# Patient Record
Sex: Female | Born: 1945
Health system: Southern US, Community
[De-identification: ages and names within clinical notes are randomized; demographics above are authoritative.]

## PROBLEM LIST (undated history)

## (undated) DIAGNOSIS — I10 Essential (primary) hypertension: Secondary | ICD-10-CM

## (undated) DIAGNOSIS — S42309A Unspecified fracture of shaft of humerus, unspecified arm, initial encounter for closed fracture: Secondary | ICD-10-CM

## (undated) DIAGNOSIS — C9000 Multiple myeloma not having achieved remission: Principal | ICD-10-CM

## (undated) DIAGNOSIS — C801 Malignant (primary) neoplasm, unspecified: Secondary | ICD-10-CM

## (undated) DIAGNOSIS — R4020 Unspecified coma: Secondary | ICD-10-CM

## (undated) DIAGNOSIS — Z7189 Other specified counseling: Secondary | ICD-10-CM

## (undated) DIAGNOSIS — I519 Heart disease, unspecified: Secondary | ICD-10-CM

## (undated) HISTORY — DX: Unspecified coma: R40.20

## (undated) HISTORY — DX: Unspecified fracture of shaft of humerus, unspecified arm, initial encounter for closed fracture: S42.309A

## (undated) HISTORY — PX: OTHER SURGICAL HISTORY: SHX169

## (undated) HISTORY — DX: Other specified counseling: Z71.89

## (undated) HISTORY — DX: Hypercalcemia: E83.52

## (undated) HISTORY — DX: Multiple myeloma not having achieved remission: C90.00

---

## 2016-04-29 ENCOUNTER — Emergency Department (HOSPITAL_COMMUNITY)
Admission: EM | Admit: 2016-04-29 | Discharge: 2016-04-30 | Disposition: A | Discharge status: Home / Self Care | Payer: Medicare Other | Attending: Emergency Medicine | Admitting: Emergency Medicine

## 2016-04-29 ENCOUNTER — Emergency Department (HOSPITAL_COMMUNITY): Payer: Medicare Other

## 2016-04-29 DIAGNOSIS — W1839XA Other fall on same level, initial encounter: Secondary | ICD-10-CM | POA: Insufficient documentation

## 2016-04-29 DIAGNOSIS — Y92000 Kitchen of unspecified non-institutional (private) residence as  the place of occurrence of the external cause: Secondary | ICD-10-CM | POA: Insufficient documentation

## 2016-04-29 DIAGNOSIS — Y9389 Activity, other specified: Secondary | ICD-10-CM | POA: Diagnosis not present

## 2016-04-29 DIAGNOSIS — S42202A Unspecified fracture of upper end of left humerus, initial encounter for closed fracture: Secondary | ICD-10-CM | POA: Diagnosis not present

## 2016-04-29 DIAGNOSIS — M79602 Pain in left arm: Secondary | ICD-10-CM | POA: Diagnosis not present

## 2016-04-29 DIAGNOSIS — Y999 Unspecified external cause status: Secondary | ICD-10-CM | POA: Diagnosis not present

## 2016-04-29 DIAGNOSIS — R531 Weakness: Secondary | ICD-10-CM | POA: Diagnosis not present

## 2016-04-29 DIAGNOSIS — R404 Transient alteration of awareness: Secondary | ICD-10-CM | POA: Diagnosis not present

## 2016-04-29 DIAGNOSIS — S4992XA Unspecified injury of left shoulder and upper arm, initial encounter: Secondary | ICD-10-CM | POA: Diagnosis present

## 2016-04-29 NOTE — ED Triage Notes (Signed)
Fell from standing at home, landed on her left side.  Pt has deformity to left shoulder.

## 2016-04-29 NOTE — ED Provider Notes (Signed)
[facility] [place] DEPT Provider Note   CSN: [id number] Arrival date & time: 04/29/16  2215  By signing my name below, I, Gwenlyn Fudge, attest that this documentation has been prepared under the direction and in the presence of Orpah Greek, MD. Electronically Signed: Gwenlyn Fudge, ED Scribe. 04/29/16. 11:11 PM.   History   Chief Complaint No chief complaint on file.  The history is provided by the patient. No language interpreter was used.    HPI Comments: Deanna Holloway is a 71 y.o. female who presents to the Emergency Department complaining of constant, moderate, unchanged left shoulder pain s/p fall earlier tonight. She attempted to stand up on the chair in the kitchen and fell, landing on her left shoulder on hardwood flooring. She states pain radiates across left shoulder blade, but does not radiate up to her neck or down her left arm. Pt denies LOC, head injury, neck pain, left elbow pain.  No past medical history on file.  There are no active problems to display for this patient.   No past surgical history on file.  OB History    No data available       Home Medications    Prior to Admission medications   Medication Sig Start Date End Date Taking? Authorizing Provider  NON FORMULARY Take 1 scoop by mouth daily. YOUNGJEVITY:: [employer] contains a base of [employer] Derived Minerals blended with vitamins, amino acids, and other beneficial nutrients to make a balanced and complete daily supplement.  BENEFITS: Natural antioxidant Great tangy tangerine taste Supports overall health and a healthy immune, cardiovascular, and digestive system Gluten-free with no artificial sweeteners or preservatives Low glycemic index/glycemic friendly Promotes health blood sugar levels.   Yes Historical Provider, MD    Family History No family history on file.  Social History Social History  Substance Use Topics  . Smoking status: Not on file  . Smokeless  tobacco: Not on file  . Alcohol use Not on file     Allergies   Patient has no known allergies.   Review of Systems Review of Systems  Musculoskeletal: Positive for arthralgias. Negative for neck pain.  Neurological: Negative for syncope.  All other systems reviewed and are negative.  Physical Exam Updated Vital Signs BP 114/90   Pulse 76   Temp 98.2 F (36.8 C) (Oral)   Resp 17   SpO2 100%   Physical Exam  Constitutional: She is oriented to person, place, and time. She appears well-developed and well-nourished. No distress.  HENT:  Head: Normocephalic and atraumatic.  Right Ear: Hearing normal.  Left Ear: Hearing normal.  Nose: Nose normal.  Mouth/Throat: Oropharynx is clear and moist and mucous membranes are normal.  Eyes: Conjunctivae and EOM are normal. Pupils are equal, round, and reactive to light.  Neck: Normal range of motion. Neck supple.  Cardiovascular: Regular rhythm, S1 normal and S2 normal.  Exam reveals no gallop and no friction rub.   No murmur heard. Pulmonary/Chest: Effort normal and breath sounds normal. No respiratory distress. She exhibits no tenderness.  Abdominal: Soft. Normal appearance and bowel sounds are normal. There is no hepatosplenomegaly. There is no tenderness. There is no rebound, no guarding, no tenderness at McBurney's point and negative Murphy's sign. No hernia.  Musculoskeletal: Normal range of motion.  Tenderness and decreased ROM and no obvious deformity noted   Neurological: She is alert and oriented to person, place, and time. She has normal strength. No cranial nerve deficit or sensory deficit.  Coordination normal. GCS eye subscore is 4. GCS verbal subscore is 5. GCS motor subscore is 6.  Skin: Skin is warm, dry and intact. No rash noted. No cyanosis.  Psychiatric: She has a normal mood and affect. Her speech is normal and behavior is normal. Thought content normal.  Nursing note and vitals reviewed.  ED Treatments / Results    DIAGNOSTIC STUDIES: Oxygen Saturation is 99% on RA, normal by my interpretation.    COORDINATION OF CARE: 11:05 PM Discussed treatment plan with pt at bedside which includes DG Shoulder left and pt agreed to plan.  Labs (all labs ordered are listed, but only abnormal results are displayed) Labs Reviewed - No data to display  EKG  EKG Interpretation None       Radiology Dg Shoulder Left  Result Date: 04/30/2016 CLINICAL DATA:  Shoulder pain after falling backwards tonight onto a hardwood floor. EXAM: LEFT SHOULDER - 2+ VIEW COMPARISON:  None. FINDINGS: There is an acute proximal humeral fracture with good alignment and position. The fracture is obliquely oriented through the proximal diaphysis and surgical neck. No dislocation. No acute soft tissue abnormality. IMPRESSION: Proximal humeral fracture. Electronically Signed   By: Andreas Newport M.D.   On: 04/30/2016 00:06    Procedures Procedures (including critical care time)  Medications Ordered in ED Medications - No data to display   Initial Impression / Assessment and Plan / ED Course  I have reviewed the triage vital signs and the nursing notes.  Pertinent labs & imaging results that were available during my care of the patient were reviewed by me and considered in my medical decision making (see chart for details).    Patient presents for evaluation of left shoulder injury after a fall. Patient demented on her left shoulder area. There was no head injury or loss of consciousness. Denies neck and back pain. The only area of injury is shoulder. X-ray confirms nondisplaced proximal humerus fracture. Patient placed in sling, provided analgesia and will follow-up with orthopedics.  I personally performed the services described in this documentation, which was scribed in my presence. The recorded information has been reviewed and is accurate.   Final Clinical Impressions(s) / ED Diagnoses   Final diagnoses:  Closed  fracture of proximal end of left humerus, unspecified fracture morphology, initial encounter    New Prescriptions New Prescriptions   No medications on file     Orpah Greek, MD 04/30/16 0020

## 2016-04-30 DIAGNOSIS — S42202A Unspecified fracture of upper end of left humerus, initial encounter for closed fracture: Secondary | ICD-10-CM | POA: Diagnosis not present

## 2016-04-30 MED ORDER — HYDROCODONE-ACETAMINOPHEN 5-325 MG PO TABS: 0.5000 | ORAL_TABLET | ORAL | 0 refills | Status: DC | PRN

## 2016-04-30 NOTE — Discharge Instructions (Signed)
Schedule follow-up with one of the listed orthopedic surgeons next week to follow healing of your fracture.

## 2016-05-02 MED FILL — Hydrocodone-Acetaminophen Tab 5-325 MG: ORAL | Qty: 6 | Status: AC

## 2016-05-09 DIAGNOSIS — Z8673 Personal history of transient ischemic attack (TIA), and cerebral infarction without residual deficits: Secondary | ICD-10-CM | POA: Diagnosis not present

## 2016-05-09 DIAGNOSIS — Z5320 Procedure and treatment not carried out because of patient's decision for unspecified reasons: Secondary | ICD-10-CM | POA: Diagnosis not present

## 2016-05-09 DIAGNOSIS — D649 Anemia, unspecified: Secondary | ICD-10-CM | POA: Diagnosis not present

## 2016-05-12 ENCOUNTER — Emergency Department (HOSPITAL_COMMUNITY): Payer: Medicare Other

## 2016-05-12 ENCOUNTER — Encounter: Payer: Self-pay | Admitting: Emergency Medicine

## 2016-05-12 ENCOUNTER — Emergency Department (HOSPITAL_COMMUNITY)
Admission: EM | Admit: 2016-05-12 | Discharge: 2016-05-12 | Disposition: A | Discharge status: Home / Self Care | Payer: Medicare Other | Attending: Emergency Medicine | Admitting: Emergency Medicine

## 2016-05-12 DIAGNOSIS — E876 Hypokalemia: Secondary | ICD-10-CM

## 2016-05-12 DIAGNOSIS — R5383 Other fatigue: Secondary | ICD-10-CM

## 2016-05-12 DIAGNOSIS — Z79899 Other long term (current) drug therapy: Secondary | ICD-10-CM | POA: Insufficient documentation

## 2016-05-12 DIAGNOSIS — D649 Anemia, unspecified: Secondary | ICD-10-CM | POA: Insufficient documentation

## 2016-05-12 DIAGNOSIS — N83202 Unspecified ovarian cyst, left side: Secondary | ICD-10-CM | POA: Diagnosis not present

## 2016-05-12 DIAGNOSIS — K5900 Constipation, unspecified: Secondary | ICD-10-CM | POA: Diagnosis not present

## 2016-05-12 DIAGNOSIS — R930 Abnormal findings on diagnostic imaging of skull and head, not elsewhere classified: Secondary | ICD-10-CM | POA: Insufficient documentation

## 2016-05-12 DIAGNOSIS — R109 Unspecified abdominal pain: Secondary | ICD-10-CM

## 2016-05-12 DIAGNOSIS — N289 Disorder of kidney and ureter, unspecified: Secondary | ICD-10-CM | POA: Diagnosis not present

## 2016-05-12 DIAGNOSIS — R1084 Generalized abdominal pain: Secondary | ICD-10-CM | POA: Diagnosis not present

## 2016-05-12 DIAGNOSIS — M899 Disorder of bone, unspecified: Secondary | ICD-10-CM

## 2016-05-12 DIAGNOSIS — R05 Cough: Secondary | ICD-10-CM | POA: Diagnosis not present

## 2016-05-12 LAB — CBC WITH DIFFERENTIAL/PLATELET
BASOS ABS: 0 10*3/uL (ref 0.0–0.1)
Basophils Relative: 0 %
EOS PCT: 0 %
Eosinophils Absolute: 0 10*3/uL (ref 0.0–0.7)
HCT: 23.2 % — ABNORMAL LOW (ref 36.0–46.0)
HEMOGLOBIN: 7.8 g/dL — AB (ref 12.0–15.0)
LYMPHS ABS: 0.5 10*3/uL — AB (ref 0.7–4.0)
LYMPHS PCT: 20 %
MCH: 29.7 pg (ref 26.0–34.0)
MCHC: 33.6 g/dL (ref 30.0–36.0)
MCV: 88.2 fL (ref 78.0–100.0)
Monocytes Absolute: 0.2 10*3/uL (ref 0.1–1.0)
Monocytes Relative: 7 %
NEUTROS ABS: 1.9 10*3/uL (ref 1.7–7.7)
Neutrophils Relative %: 73 %
PLATELETS: 152 10*3/uL (ref 150–400)
RBC: 2.63 MIL/uL — AB (ref 3.87–5.11)
RDW: 17 % — ABNORMAL HIGH (ref 11.5–15.5)
WBC: 2.6 10*3/uL — AB (ref 4.0–10.5)

## 2016-05-12 LAB — TROPONIN I

## 2016-05-12 LAB — URINALYSIS, ROUTINE W REFLEX MICROSCOPIC
BILIRUBIN URINE: NEGATIVE
GLUCOSE, UA: NEGATIVE mg/dL
KETONES UR: NEGATIVE mg/dL
NITRITE: NEGATIVE
PH: 6 (ref 5.0–8.0)
Protein, ur: 30 mg/dL — AB
SPECIFIC GRAVITY, URINE: 1.012 (ref 1.005–1.030)

## 2016-05-12 LAB — BASIC METABOLIC PANEL
ANION GAP: 9 (ref 5–15)
BUN: 31 mg/dL — AB (ref 6–20)
CHLORIDE: 96 mmol/L — AB (ref 101–111)
CO2: 28 mmol/L (ref 22–32)
Calcium: 13.3 mg/dL (ref 8.9–10.3)
Creatinine, Ser: 1.78 mg/dL — ABNORMAL HIGH (ref 0.44–1.00)
GFR calc Af Amer: 32 mL/min — ABNORMAL LOW (ref >60)
GFR calc non Af Amer: 28 mL/min — ABNORMAL LOW (ref >60)
GLUCOSE: 104 mg/dL — AB (ref 65–99)
POTASSIUM: 3.3 mmol/L — AB (ref 3.5–5.1)
Sodium: 133 mmol/L — ABNORMAL LOW (ref 135–145)

## 2016-05-12 LAB — TYPE AND SCREEN
ABO/RH(D): A POS
ANTIBODY SCREEN: NEGATIVE

## 2016-05-12 LAB — SAMPLE TO BLOOD BANK

## 2016-05-12 LAB — LACTIC ACID, PLASMA: LACTIC ACID, VENOUS: 1 mmol/L (ref 0.5–1.9)

## 2016-05-12 MED ORDER — SODIUM CHLORIDE 0.9 % IV BOLUS (SEPSIS)
500.0000 mL | Freq: Once | INTRAVENOUS | Status: AC
  Administered 2016-05-12: 500 mL via INTRAVENOUS

## 2016-05-12 MED ORDER — SODIUM CHLORIDE 0.9 % IV SOLN
INTRAVENOUS | Status: DC
  Administered 2016-05-12: 100 mL/h via INTRAVENOUS

## 2016-05-12 MED ORDER — POTASSIUM CHLORIDE CRYS ER 20 MEQ PO TBCR
40.0000 meq | EXTENDED_RELEASE_TABLET | Freq: Once | ORAL | Status: AC
  Administered 2016-05-12: 40 meq via ORAL
  Filled 2016-05-12: qty 2

## 2016-05-12 MED ORDER — IOPAMIDOL (ISOVUE-300) INJECTION 61%
INTRAVENOUS | Status: AC
  Filled 2016-05-12: qty 30

## 2016-05-12 NOTE — Discharge Instructions (Signed)
Take your usual prescriptions as previously directed.  Call your regular medical doctor tomorrow to schedule a follow up appointment within the next 1 to 2 days. Call the GI doctor tomorrow to schedule a follow up appointment within the next 3 to 4 days. Call the Oncologist tomorrow to schedule a follow up appointment within the next week.  Return to the Emergency Department immediately sooner if worsening.

## 2016-05-12 NOTE — ED Provider Notes (Signed)
[facility] DEPT Provider Note   CSN: [id number] Arrival date & time: 05/12/16  1123     History   Chief Complaint Chief Complaint  Patient presents with  . Hemorrhoids  . Fatigue    HPI Deanna Holloway is a 71 y.o. female.  HPI Pt was seen at 1300. Per pt and her friend: c/o gradual onset and worsening of multiple symptoms for the past 12 days. Symptoms include: cough, generalized weakness/fatigue, nausea, poor PO intake, and rectal bleeding. Pt states she was evaluated in [facility] ED 3 days ago for her symptoms, received PRBC transfusion, then left AMA because she did not want to be transferred out to another hospital for admission. Pt's friend states pt is now "too weak to even get up and answer the phone." Pt states she continues to "see blood" on the toilet paper when she wipes, but has not had a BM in 4 days. States her last stools "were normal;" denies black or bloody stools. Denies CP/SOB, no abd pain, no vomiting/diarrhea, no fevers, no new falls, no syncope, no focal motor weakness.     No past medical history on file.  There are no active problems to display for this patient.   No past surgical history on file.  OB History    No data available       Home Medications    Prior to Admission medications   Medication Sig Start Date End Date Taking? Authorizing Provider  HYDROcodone-acetaminophen (NORCO/VICODIN) 5-325 MG tablet Take 0.5 tablets by mouth every 4 (four) hours as needed for moderate pain. 04/30/16   Orpah Greek, MD  HYDROcodone-acetaminophen (NORCO/VICODIN) 5-325 MG tablet Take 0.5 tablets by mouth every 4 (four) hours as needed. 04/30/16   Orpah Greek, MD  NON FORMULARY Take 1 scoop by mouth daily. YOUNGJEVITY:: [employer] contains a base of [employer] Derived Minerals blended with vitamins, amino acids, and other beneficial nutrients to make a balanced and complete daily supplement.  BENEFITS: Natural  antioxidant Great tangy tangerine taste Supports overall health and a healthy immune, cardiovascular, and digestive system Gluten-free with no artificial sweeteners or preservatives Low glycemic index/glycemic friendly Promotes health blood sugar levels.    Historical Provider, MD    Family History   Social History Social History  Substance Use Topics  . Smoking status: Not on file  . Smokeless tobacco: Not on file  . Alcohol use Not on file     Allergies   Patient has no known allergies.   Review of Systems Review of Systems ROS: Statement: All systems negative except as marked or noted in the HPI; Constitutional: Negative for fever and chills. +generalized weakness/fatigue.; ; Eyes: Negative for eye pain, redness and discharge. ; ; ENMT: Negative for ear pain, hoarseness, nasal congestion, sinus pressure and sore throat. ; ; Cardiovascular: Negative for chest pain, palpitations, diaphoresis, dyspnea and peripheral edema. ; ; Respiratory: +cough. Negative for wheezing and stridor. ; ; Gastrointestinal: Negative for vomiting, diarrhea, abdominal pain, hematemesis, jaundice and +nausea, +rectal bleeding. ; ; Genitourinary: Negative for dysuria, flank pain and hematuria. ; ; Musculoskeletal: Negative for back pain and neck pain. Negative for swelling and trauma.; ; Skin: Negative for pruritus, rash, abrasions, blisters, bruising and skin lesion.; ; Neuro: Negative for headache, lightheadedness and neck stiffness. Negative for altered level of consciousness, altered mental status, extremity weakness, paresthesias, involuntary movement, seizure and syncope.       Physical Exam Updated Vital Signs BP 128/60 (BP Location: Right Arm)  Pulse 87   Temp 99.2 F (37.3 C) (Oral)   Resp 25   Ht 5\' 3"  (1.6 m)   Wt 150 lb (68 kg)   SpO2 91%   BMI 26.57 kg/m     13:50 Orthostatic Vital Signs DF  Orthostatic Lying   BP- Lying: 128/60  Pulse- Lying: 88      Orthostatic Sitting  BP-  Sitting: 149/67  Pulse- Sitting: 89      Orthostatic Standing at 0 minutes  BP- Standing at 0 minutes: 112/58  Pulse- Standing at 0 minutes: 92    Physical Exam 1305: Physical examination:  Nursing notes reviewed; Vital signs and O2 SAT reviewed;  Constitutional: Well developed, Well nourished, Well hydrated, In no acute distress; Head:  Normocephalic, atraumatic; Eyes: EOMI, PERRL, No scleral icterus; ENMT: Mouth and pharynx normal, Mucous membranes moist; Neck: Supple, Full range of motion, No lymphadenopathy; Cardiovascular: Regular rate and rhythm, No gallop; Respiratory: Breath sounds clear & equal bilaterally, No wheezes.  Speaking full sentences with ease, Normal respiratory effort/excursion. +non-productive cough during exam.; Chest: Nontender, Movement normal; Abdomen: Soft, Nontender, Nondistended, Normal bowel sounds. Rectal exam performed w/permission of pt and ED RN chaperone present.  Anal tone normal.  Non-tender, soft black stool in rectal vault, heme neg.  No fissures, +external hemorrhoid with overlying scab, no active bleeding.;; Genitourinary: No CVA tenderness; Extremities: Pulses normal, +LUE in sling. No edema, No calf edema or asymmetry.; Neuro: AA&Ox3, rambling historian. Major CN grossly intact. No facial droop. Speech clear. +left arm in sling, otherwise no gross focal motor deficits in extremities.; Skin: Color normal, Warm, Dry.   ED Treatments / Results  Labs (all labs ordered are listed, but only abnormal results are displayed)   EKG  EKG Interpretation  Date/Time:  Thursday May 12 2016 13:47:04 EST Ventricular Rate:  87 PR Interval:    QRS Duration: 89 QT Interval:  322 QTC Calculation: 388 R Axis:     Text Interpretation:  Sinus rhythm Atrial premature complex Abnormal R-wave progression, early transition No old tracing to compare Confirmed by [facility]  MD, Nunzio Cory [phone]) on 05/12/2016 2:33:54 PM       Radiology   Procedures Procedures  (including critical care time)  Medications Ordered in ED Medications - No data to display   Initial Impression / Assessment and Plan / ED Course  I have reviewed the triage vital signs and the nursing notes.  Pertinent labs & imaging results that were available during my care of the patient were reviewed by me and considered in my medical decision making (see chart for details).  MDM Reviewed: previous chart, nursing note and vitals Interpretation: labs, ECG, x-ray and CT scan   Results for orders placed or performed during the hospital encounter of 05/12/16  CBC with Differential  Result Value Ref Range   WBC 2.6 (L) 4.0 - 10.5 K/uL   RBC 2.63 (L) 3.87 - 5.11 MIL/uL   Hemoglobin 7.8 (L) 12.0 - 15.0 g/dL   HCT 23.2 (L) 36.0 - 46.0 %   MCV 88.2 78.0 - 100.0 fL   MCH 29.7 26.0 - 34.0 pg   MCHC 33.6 30.0 - 36.0 g/dL   RDW 17.0 (H) 11.5 - 15.5 %   Platelets 152 150 - 400 K/uL   Neutrophils Relative % 73 %   Neutro Abs 1.9 1.7 - 7.7 K/uL   Lymphocytes Relative 20 %   Lymphs Abs 0.5 (L) 0.7 - 4.0 K/uL   Monocytes Relative 7 %  Monocytes Absolute 0.2 0.1 - 1.0 K/uL   Eosinophils Relative 0 %   Eosinophils Absolute 0.0 0.0 - 0.7 K/uL   Basophils Relative 0 %   Basophils Absolute 0.0 0.0 - 0.1 K/uL  Basic metabolic panel  Result Value Ref Range   Sodium 133 (L) 135 - 145 mmol/L   Potassium 3.3 (L) 3.5 - 5.1 mmol/L   Chloride 96 (L) 101 - 111 mmol/L   CO2 28 22 - 32 mmol/L   Glucose, Bld 104 (H) 65 - 99 mg/dL   BUN 31 (H) 6 - 20 mg/dL   Creatinine, Ser 1.78 (H) 0.44 - 1.00 mg/dL   Calcium 13.3 (HH) 8.9 - 10.3 mg/dL   GFR calc non Af Amer 28 (L) >60 mL/min   GFR calc Af Amer 32 (L) >60 mL/min   Anion gap 9 5 - 15  Troponin I  Result Value Ref Range   Troponin I <0.03 <0.03 ng/mL  Lactic acid, plasma  Result Value Ref Range   Lactic Acid, Venous 1.0 0.5 - 1.9 mmol/L  Urinalysis, Routine w reflex microscopic  Result Value Ref Range   Color, Urine YELLOW YELLOW    APPearance CLEAR CLEAR   Specific Gravity, Urine 1.012 1.005 - 1.030   pH 6.0 5.0 - 8.0   Glucose, UA NEGATIVE NEGATIVE mg/dL   Hgb urine dipstick SMALL (A) NEGATIVE   Bilirubin Urine NEGATIVE NEGATIVE   Ketones, ur NEGATIVE NEGATIVE mg/dL   Protein, ur 30 (A) NEGATIVE mg/dL   Nitrite NEGATIVE NEGATIVE   Leukocytes, UA TRACE (A) NEGATIVE   RBC / HPF 0-5 0 - 5 RBC/hpf   WBC, UA 0-5 0 - 5 WBC/hpf   Bacteria, UA RARE (A) NONE SEEN   Mucous PRESENT    Hyaline Casts, UA PRESENT   Sample to Blood Bank  Result Value Ref Range   Blood Bank Specimen SAMPLE AVAILABLE FOR TESTING    Sample Expiration 05/15/2016   Type and screen [facility]  Result Value Ref Range   ABO/RH(D) A POS    Antibody Screen NEG    Sample Expiration 05/15/2016    Dg Chest 2 View Result Date: 05/12/2016 CLINICAL DATA:  Cough and weakness. EXAM: CHEST  2 VIEW COMPARISON:  Plain films of the left shoulder 04/29/2016. FINDINGS: Lungs are clear. Heart size is upper normal. No pneumothorax or pleural effusion. Proximal left humerus fracture is identified as on the prior exam. IMPRESSION: No acute disease. Electronically Signed   By: Inge Rise M.D.   On: 05/12/2016 14:29   Ct Head Wo Contrast Result Date: 05/12/2016 CLINICAL DATA:  Continued weakness.  Fall 04/29/2016 EXAM: CT HEAD WITHOUT CONTRAST TECHNIQUE: Contiguous axial images were obtained from the base of the skull through the vertex without intravenous contrast. COMPARISON:  None. FINDINGS: Brain: No acute intracranial abnormality. Specifically, no hemorrhage, hydrocephalus, mass lesion, acute infarction, or significant intracranial injury. Vascular: No hyperdense vessel or unexpected calcification. Skull: Innumerable low-density lesions throughout the skull concerning for metastases or myeloma. Sinuses/Orbits: Visualized paranasal sinuses and mastoids clear. Orbital soft tissues unremarkable. Other: None IMPRESSION: Innumerable low-density lesions  throughout the skull concerning for diffuse metastasis or myeloma. No acute intracranial abnormality. Electronically Signed   By: Rolm Baptise M.D.   On: 05/12/2016 14:37     1415:  Pt states she left AMA from [place] 3 days ago and "someone from the hospital called and told me to go to a hospital immediately so you can pick up where [facility] left  off." ED RN investigated this: states pt called pt advocate at [facility] to complain about her visit and the advocate advised her to be seen again since she signed out AMA and refused admission/transfer. Workup ordered.   1600:  Pt orthostatic on VS and calcium elevated: judicious IVF given. Unknown baseline H/H; T&S ordered. No active rectal bleeding while in the ED. Initial attempt to get [facility] records without success. T/C to [facility], case discussed, including:  HPI, pertinent PM/SHx, VS/PE, dx testing, ED course and treatment:  Agreeable to come to ED for evaluation, requests to attempt again to obtain medical records from [facility] [place].   2035:  Pt has ambulated with steady gait; has tol PO well without N/V. No stooling or rectal bleeding while in the ED. T/C from [facility] Lor[facility]: states labs are improved from previous, there is no criteria to admit pt at this time, pt can be discharged home with outpatient f/u (see consult note). Strict return precautions given. Dx and testing d/w pt.  Questions answered.  Verb understanding, agreeable to d/c home with outpt f/u.     Final Clinical Impressions(s) / ED Diagnoses   Final diagnoses:  None    New Prescriptions New Prescriptions   No medications on file     Francine Graven, DO 05/15/16 2216

## 2016-05-12 NOTE — ED Notes (Signed)
Patient walked to restroom with standby assistance. Patient back in bed at this time.

## 2016-05-12 NOTE — ED Triage Notes (Signed)
Pt says she has hemorrhoids bright red blood when wiping but not blood in stool.

## 2016-05-12 NOTE — ED Notes (Signed)
Pt initially presented stating someone from [facility] called this morning and told her she needed to go straight to the closest ED.  Upon investigation of this, pt called the pt advocate to complain about her visit to [place] 3 days ago.  The advocate advised her to be seen again since she signed out AMA, refusing transfer.

## 2016-05-12 NOTE — Consult Note (Signed)
Medical Consultation   Deanna Holloway  [id number]  DOB: [birth year]  DOA: 05/12/2016  PCP: No PCP Per Patient - has not made an appointment, usually sees chiropractors Consultants:  None Patient coming from: home - lives with best friend; NOK: friend, [name], [phone]  Chief Complaint: abdominal "gurgling"  HPI: Deanna Holloway is a 71 y.o. female with no significant past medical history.  She reports that she fell back in October.  Things haven't been right since.  [place] again at the beginning of this month.  Broke her arm in that fall, placed in a left arm splint.    Her big complaint today is that her stomach and intestines are not working correctly.  They gurgle but she is unable to have a BM.  Last BM was 4 days ago.  "Eating accordingly because I ddin't want to overwhelm my intestines."  Took a few stool softeners 3 days ago.  Mild hemorrhoidal bleeding.  Wearing a mask to avoid exposure here, not because she has any symptoms.    She went to ER in [place] a couple of days ago for the same problem.  They gave her 2 units of blood and told her she needed to go to another hospital [facility]) to see GI.  She declined this and left AMA.  A little bit tired, but able to walk around as much as she can.  No SOB.  She thinks her Hgb was 5.8 at that visit.  Records from [place] requested.  She had NO guaiac performed at that visit.  Her Hgb was 5.8 and she was given 2 units PRBC.  The recommendation was for transfer for "suspected GI bleed."  The patient called the Patient Advocate today to complain about her treatment in [place], and they suggested she come to the ER immediately.  This is why she reported to our ER today.   ED Course: Ambulated with a steady gait, tolerated PO without N/V.   Ambulatory Status:  Walks with a cane  Review of Systems:  ROS As per HPI otherwise 10 point review of systems negative.    Past Medical History: History reviewed. No  pertinent past medical history.  Past Surgical History: History reviewed. No pertinent surgical history.   Allergies:  No Known Allergies   Social History:  reports that she has never smoked. She has never used smokeless tobacco. She reports that she does not drink alcohol or use drugs.   Family History: History reviewed. No pertinent family history.    Physical Exam: Vitals:   05/12/16 1600 05/12/16 1630 05/12/16 1700 05/12/16 1845  BP: 119/66 111/81 123/67 120/70  Pulse: 88   85  Resp: 18 24 24 22   Temp:      TempSrc:      SpO2: 91%   94%  Weight:      Height:        Constitutional: Alert and awake, oriented x3, not in any acute distress. Eyes: PERLA, EOMI, irises appear normal, anicteric sclera,  ENMT: external ears and nose appear normal,  Lips appears normal, oropharynx mucosa, tongue, posterior pharynx appear normal  Neck: neck appears normal, no masses, normal ROM, no thyromegaly, no JVD  CVS: S1-S2 clear, no murmur rubs or gallops, no LE edema, normal pedal pulses  Respiratory:  clear to auscultation bilaterally, no wheezing, rales or rhonchi. Respiratory effort normal. No accessory muscle use.  Abdomen: soft nontender, nondistended, normal bowel  sounds, no hepatosplenomegaly, no hernias  Musculoskeletal: : no cyanosis, clubbing or edema noted bilaterally Neuro: Cranial nerves II-XII intact, strength, sensation, reflexes Psych: judgement and insight appear normal, stable mood and affect, mental status Skin: no rashes or lesions or ulcers, no induration or nodules   Data reviewed:  I have personally reviewed following labs and imaging studies Labs:  CBC:  Recent Labs Lab 05/12/16 1251  WBC 2.6*  NEUTROABS 1.9  HGB 7.8*  HCT 23.2*  MCV 88.2  PLT 0000000    Basic Metabolic Panel:  Recent Labs Lab 05/12/16 1251  NA 133*  K 3.3*  CL 96*  CO2 28  GLUCOSE 104*  BUN 31*  CREATININE 1.78*  CALCIUM 13.3*   GFR Estimated Creatinine Clearance: 26.8  mL/min (by C-G formula based on SCr of 1.78 mg/dL (H)). Liver Function Tests: No results for input(s): AST, ALT, ALKPHOS, BILITOT, PROT, ALBUMIN in the last 168 hours. No results for input(s): LIPASE, AMYLASE in the last 168 hours. No results for input(s): AMMONIA in the last 168 hours. Coagulation profile No results for input(s): INR, PROTIME in the last 168 hours.  Cardiac Enzymes:  Recent Labs Lab 05/12/16 1358  TROPONINI <0.03   BNP: Invalid input(s): POCBNP CBG: No results for input(s): GLUCAP in the last 168 hours. D-Dimer No results for input(s): DDIMER in the last 72 hours. Hgb A1c No results for input(s): HGBA1C in the last 72 hours. Lipid Profile No results for input(s): CHOL, HDL, LDLCALC, TRIG, CHOLHDL, LDLDIRECT in the last 72 hours. Thyroid function studies No results for input(s): TSH, T4TOTAL, T3FREE, THYROIDAB in the last 72 hours.  Invalid input(s): FREET3 Anemia work up No results for input(s): VITAMINB12, FOLATE, FERRITIN, TIBC, IRON, RETICCTPCT in the last 72 hours. Urinalysis    Component Value Date/Time   COLORURINE YELLOW 05/12/2016 1311   APPEARANCEUR CLEAR 05/12/2016 1311   LABSPEC 1.012 05/12/2016 1311   PHURINE 6.0 05/12/2016 1311   GLUCOSEU NEGATIVE 05/12/2016 1311   HGBUR SMALL (A) 05/12/2016 1311   BILIRUBINUR NEGATIVE 05/12/2016 1311   KETONESUR NEGATIVE 05/12/2016 1311   PROTEINUR 30 (A) 05/12/2016 1311   NITRITE NEGATIVE 05/12/2016 1311   LEUKOCYTESUR TRACE (A) 05/12/2016 1311     Microbiology No results found for this or any previous visit (from the past 240 hour(s)).   EKG: NSR, rate 87, nonspecific ST changes without evidence of acute ischemia  Inpatient Medications:   Scheduled Meds: . iopamidol       Continuous Infusions: . sodium chloride Stopped (05/12/16 2040)     Radiological Exams on Admission: Ct Abdomen Pelvis Wo Contrast  Result Date: 05/12/2016 CLINICAL DATA:  Hemorrhoids with bright red blood upon  wiping but no blood in stool. EXAM: CT ABDOMEN AND PELVIS WITHOUT CONTRAST TECHNIQUE: Multidetector CT imaging of the abdomen and pelvis was performed following the standard protocol without IV contrast. COMPARISON:  None. FINDINGS: Lower chest: Top normal size cardiac chambers. No pericardial effusion. Small right pleural effusion with adjacent atelectasis and/or scarring. Hepatobiliary: Hypodensities in the left hepatic lobe consistent with hepatic cysts measuring 1.6 and 2.3 cm. A 1.2 cm right hepatic lobe cyst is also noted. Gallbladder is nondistended. There is no calculi. No biliary dilatation. Pancreas: Pancreas demonstrates no ductal dilatation, peripancreatic inflammation or mass. Spleen: Normal Adrenals/Urinary Tract: No adrenal mass. No nephrolithiasis. 2.3 mm bladder calculus noted on the left. Stomach/Bowel: Contracted stomach. Normal small bowel rotation without small bowel dilatation or inflammation. Contrast reaches large intestine. No annular constricting lesions are noted.  There is no large bowel obstruction or acute inflammation. There is scattered colonic diverticulosis the sigmoid. No significant perirectal nor perianal thickening. No inflammation. Vascular/Lymphatic: Mild aortic atherosclerosis without aneurysm. No lymphadenopathy. Reproductive: Uterus and ovaries are visualized. A few coarse calcifications of doubtful clinical significance noted of the right ovary. There is a simple appearing cyst of the left ovary measuring approximately 2 cm. No worrisome features. Other:  Tiny fat containing umbilical hernia. Musculoskeletal: The bones are osteopenic in appearance there is mild dextroconvex curvature of the lumbar spine. Disc space narrowing L3-4 and L4-5 with associated facet arthropathy. Slight coarsening and thickening of the bony pelvis and hips may reflect changes of Paget's disease. IMPRESSION: No acute bowel inflammation or obstruction. No focal lesions are identified. Simple  appearing hepatic and left ovarian cysts. Small right pleural effusion with atelectasis and/or scarring. Coarsened and thickened appearance of the bony pelvis which may reflect Paget's disease. Degenerative changes of the lower lumbar spine. Electronically Signed   By: Ashley Royalty M.D.   On: 05/12/2016 20:21   Dg Chest 2 View  Result Date: 05/12/2016 CLINICAL DATA:  Cough and weakness. EXAM: CHEST  2 VIEW COMPARISON:  Plain films of the left shoulder 04/29/2016. FINDINGS: Lungs are clear. Heart size is upper normal. No pneumothorax or pleural effusion. Proximal left humerus fracture is identified as on the prior exam. IMPRESSION: No acute disease. Electronically Signed   By: Inge Rise M.D.   On: 05/12/2016 14:29   Ct Head Wo Contrast  Result Date: 05/12/2016 CLINICAL DATA:  Continued weakness.  Fall 04/29/2016 EXAM: CT HEAD WITHOUT CONTRAST TECHNIQUE: Contiguous axial images were obtained from the base of the skull through the vertex without intravenous contrast. COMPARISON:  None. FINDINGS: Brain: No acute intracranial abnormality. Specifically, no hemorrhage, hydrocephalus, mass lesion, acute infarction, or significant intracranial injury. Vascular: No hyperdense vessel or unexpected calcification. Skull: Innumerable low-density lesions throughout the skull concerning for metastases or myeloma. Sinuses/Orbits: Visualized paranasal sinuses and mastoids clear. Orbital soft tissues unremarkable. Other: None IMPRESSION: Innumerable low-density lesions throughout the skull concerning for diffuse metastasis or myeloma. No acute intracranial abnormality. Electronically Signed   By: Rolm Baptise M.D.   On: 05/12/2016 14:37    Impression/Recommendations Principal Problem:   Abdominal pain  Pertinent labs: Troponin negative Lactate normal Creatinine 1.78 (was 2.01 in [place]) Calcium 13.3 WBC 2.6 Hgb 7.8 (was 5.8 in [place] and she was transfused 2 units PRBC) UA unremarkable  -Patient  presenting to the ER because she was told to come when she called the patient advocate in [place] to complain about her treatment in the ER 3 days prior. -At that time, she was found to be anemic and was thought to have symptomatic anemia.  She was transfused 2 units PRBC and her hemoglobin today is commensurate with this and appropriate. -She denies GI bleeding. -She is reportedly heme negative per Dr. Thurnell Garbe (I do not see this lab result at this time). -She is really without significant complaint and appears to have chronic problems (see below) that need to be addressed ASAP. -She may have some constipation. 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 -While she does appear to have hypercalcemia of malignancy, she is not complaining of symptoms accordingly (moans, groans, and psychotic overtones). -She does not appear to have acute issues requiring hospitalization. -Would definitely suggest short-term outpatient  PCP follow-up as well as referral to oncology for outpatient evaluation.  Thank you for this consultation.    Time Spent: 75 minutes  Karmen Bongo M.D. [facility] 05/12/2016, 8:59 PM

## 2016-05-12 NOTE — ED Triage Notes (Signed)
Pt was seen here at [facility] on 04/29/16 for fall.  Pt given instructions and medications but has not followed up with recommendations or filled prescriptions.  Pt went to [facility] on Feb 13th, c/o blood in stool, weakness.  Pt was given 2 units of blood in ED and [place] wanted to transfer to [facility] (GI) but friend Saverio Danker [phone]) and pt did not want to travel that far for treatment.  Mr. Georgeanna Lea says Barnabas Harries called pt is am and told pt to go to ED for treatment.

## 2016-05-13 ENCOUNTER — Telehealth (HOSPITAL_COMMUNITY): Payer: Self-pay | Admitting: Oncology

## 2016-05-13 ENCOUNTER — Other Ambulatory Visit (HOSPITAL_COMMUNITY): Payer: Self-pay | Admitting: Oncology

## 2016-05-13 DIAGNOSIS — D649 Anemia, unspecified: Secondary | ICD-10-CM

## 2016-05-13 DIAGNOSIS — R930 Abnormal findings on diagnostic imaging of skull and head, not elsewhere classified: Secondary | ICD-10-CM

## 2016-05-13 NOTE — Telephone Encounter (Signed)
I personally reviewed the patient's chart.  I note that she is significantly hypercalcemic (no albumin available to correct if patient is hypoalbuminemic).  She also has moderate anemia.  Her CT head is concerning for multiple myeloma/plasmacytoma.  I am surprised that Dr. Lorin Mercy did not feel she was a candidate for hospitalization.  Given the severity of her labs and concern for a corrected calcium even higher than reported in the ED, I personally called her after hours to work her into the clinic on Monday.  I spoke with the patient.  She reports that she feels fine.  Her words are slow and slurred.  I asked her to come to the clinic tomorrow for her anemia and high calcium and she refused.  With her refusal, I will ask scheduler to call her again Monday AM to see if we can convince her of the importance of seeing [place].  If she does come, she will need STAT labs, IV zometa for hypercalcemia, and we will see her in consultation with plans for PET imaging and bone marrow aspiration and biopsy.  Robynn Pane, PA-C 05/13/2016 5:16 PM

## 2016-05-14 LAB — URINE CULTURE: CULTURE: NO GROWTH

## 2016-05-16 NOTE — Telephone Encounter (Signed)
Called patient this am and tried to get her to come in for repeat labs and to see provider. I explained her calcium was extremely high and she was anemic also. She stated " I'm not coming in but thanks for calling." Instructed patient to call cancer center if she changed her mind. She verbalized understanding.

## 2016-05-27 ENCOUNTER — Encounter (HOSPITAL_COMMUNITY): Payer: Self-pay | Admitting: Lab

## 2016-05-27 ENCOUNTER — Other Ambulatory Visit (HOSPITAL_COMMUNITY): Payer: Self-pay | Admitting: Oncology

## 2016-05-27 DIAGNOSIS — D649 Anemia, unspecified: Secondary | ICD-10-CM

## 2016-05-27 DIAGNOSIS — E43 Unspecified severe protein-calorie malnutrition: Secondary | ICD-10-CM | POA: Diagnosis not present

## 2016-05-27 DIAGNOSIS — M8588 Other specified disorders of bone density and structure, other site: Secondary | ICD-10-CM | POA: Diagnosis present

## 2016-05-27 DIAGNOSIS — Z8249 Family history of ischemic heart disease and other diseases of the circulatory system: Secondary | ICD-10-CM | POA: Diagnosis not present

## 2016-05-27 DIAGNOSIS — E872 Acidosis: Secondary | ICD-10-CM | POA: Diagnosis not present

## 2016-05-27 DIAGNOSIS — Z818 Family history of other mental and behavioral disorders: Secondary | ICD-10-CM | POA: Diagnosis not present

## 2016-05-27 DIAGNOSIS — R918 Other nonspecific abnormal finding of lung field: Secondary | ICD-10-CM | POA: Diagnosis not present

## 2016-05-27 DIAGNOSIS — R937 Abnormal findings on diagnostic imaging of other parts of musculoskeletal system: Secondary | ICD-10-CM | POA: Diagnosis not present

## 2016-05-27 DIAGNOSIS — Z91048 Other nonmedicinal substance allergy status: Secondary | ICD-10-CM | POA: Diagnosis not present

## 2016-05-27 DIAGNOSIS — K5900 Constipation, unspecified: Secondary | ICD-10-CM | POA: Diagnosis present

## 2016-05-27 DIAGNOSIS — R7989 Other specified abnormal findings of blood chemistry: Secondary | ICD-10-CM | POA: Diagnosis not present

## 2016-05-27 DIAGNOSIS — D472 Monoclonal gammopathy: Secondary | ICD-10-CM | POA: Diagnosis present

## 2016-05-27 DIAGNOSIS — Z6826 Body mass index (BMI) 26.0-26.9, adult: Secondary | ICD-10-CM | POA: Diagnosis not present

## 2016-05-27 DIAGNOSIS — D892 Hypergammaglobulinemia, unspecified: Secondary | ICD-10-CM | POA: Diagnosis not present

## 2016-05-27 DIAGNOSIS — Z66 Do not resuscitate: Secondary | ICD-10-CM | POA: Diagnosis not present

## 2016-05-27 DIAGNOSIS — C9000 Multiple myeloma not having achieved remission: Secondary | ICD-10-CM | POA: Diagnosis not present

## 2016-05-27 DIAGNOSIS — N12 Tubulo-interstitial nephritis, not specified as acute or chronic: Secondary | ICD-10-CM | POA: Diagnosis not present

## 2016-05-27 DIAGNOSIS — D61818 Other pancytopenia: Secondary | ICD-10-CM | POA: Diagnosis not present

## 2016-05-27 DIAGNOSIS — D72819 Decreased white blood cell count, unspecified: Secondary | ICD-10-CM | POA: Diagnosis not present

## 2016-05-27 DIAGNOSIS — M899 Disorder of bone, unspecified: Secondary | ICD-10-CM | POA: Diagnosis not present

## 2016-05-27 DIAGNOSIS — N19 Unspecified kidney failure: Secondary | ICD-10-CM | POA: Diagnosis not present

## 2016-05-27 DIAGNOSIS — N179 Acute kidney failure, unspecified: Secondary | ICD-10-CM | POA: Diagnosis not present

## 2016-05-27 DIAGNOSIS — R5081 Fever presenting with conditions classified elsewhere: Secondary | ICD-10-CM | POA: Diagnosis not present

## 2016-05-27 DIAGNOSIS — S42202A Unspecified fracture of upper end of left humerus, initial encounter for closed fracture: Secondary | ICD-10-CM | POA: Diagnosis not present

## 2016-05-27 DIAGNOSIS — I471 Supraventricular tachycardia: Secondary | ICD-10-CM | POA: Diagnosis not present

## 2016-05-27 DIAGNOSIS — D696 Thrombocytopenia, unspecified: Secondary | ICD-10-CM | POA: Diagnosis not present

## 2016-05-27 NOTE — Progress Notes (Unsigned)
Patient called and I gave her new appt date for 3/7.  Patient said that is not soon enough for apptand to talk to Dr appt that. I talked to [facility] and he stated that it was the 1st appt.  Patient was rude and angery about appt date and would not let me tell her appt time.  I told Gershon Mussel and he is aware of the issue.

## 2016-05-28 DIAGNOSIS — R937 Abnormal findings on diagnostic imaging of other parts of musculoskeletal system: Secondary | ICD-10-CM | POA: Diagnosis not present

## 2016-05-28 DIAGNOSIS — D649 Anemia, unspecified: Secondary | ICD-10-CM | POA: Diagnosis not present

## 2016-05-28 DIAGNOSIS — R7989 Other specified abnormal findings of blood chemistry: Secondary | ICD-10-CM | POA: Diagnosis not present

## 2016-06-01 ENCOUNTER — Ambulatory Visit (HOSPITAL_COMMUNITY): Payer: Medicare Other

## 2016-06-01 ENCOUNTER — Ambulatory Visit (HOSPITAL_COMMUNITY): Payer: Medicare Other | Admitting: Oncology

## 2016-06-01 ENCOUNTER — Other Ambulatory Visit (HOSPITAL_COMMUNITY): Payer: Medicare Other

## 2016-06-06 ENCOUNTER — Telehealth (HOSPITAL_COMMUNITY): Payer: Self-pay | Admitting: Oncology

## 2016-06-06 NOTE — Telephone Encounter (Signed)
Spoke with Dr. Corinne Ports this AM about patient.  He is the fellow who has been involved with the patient's care at [facility].  He will fax [place] records about the patient.  Patient is being discharged today. She has refused BMBX.  She did get Zometa.  Unfortunately, her creatinine is trending upwards, likely from her myeloma.  She has been resistant to treatment.  Hopefully we can start her on Vd and if renal function improves consider RVd.  Patient is resistant to IV chemotherapy, according to [facility] and therefore, Velcade SQ may be our only treatment option at this time.  She has also needed PRBC transfusion, last one on 06/04/2016, at [facility].  She was given 20 mg of Dexamethasone daily x 4 days for MM treatment, but developed SVT, likely secondary to Dex.  This has been medically managed and corrected.  Dr. Corinne Ports reports that the patient is a little more open to discussion regarding her MM than when she was first admitted at [facility].  Robynn Pane, PA-C 06/06/2016 3:44 PM

## 2016-06-07 ENCOUNTER — Encounter (HOSPITAL_COMMUNITY): Payer: Self-pay

## 2016-06-07 ENCOUNTER — Encounter (HOSPITAL_COMMUNITY): Payer: Medicare Other

## 2016-06-07 ENCOUNTER — Encounter (HOSPITAL_COMMUNITY): Payer: Medicare Other | Attending: Oncology | Admitting: Oncology

## 2016-06-07 DIAGNOSIS — D649 Anemia, unspecified: Secondary | ICD-10-CM

## 2016-06-07 DIAGNOSIS — R609 Edema, unspecified: Secondary | ICD-10-CM

## 2016-06-07 DIAGNOSIS — C9000 Multiple myeloma not having achieved remission: Secondary | ICD-10-CM

## 2016-06-07 NOTE — Patient Instructions (Signed)
[facility] Discharge Instructions  RECOMMENDATIONS MADE BY THE CONSULTANT AND ANY TEST RESULTS WILL BE SENT TO YOUR REFERRING PHYSICIAN.  You were seen today by Dr. Twana First STAT bone marrow biopsy will be scheduled Follow up in 2 weeks See Amy up front for appointments   Thank you for choosing [place] [facility] to provide your oncology and hematology care.  To afford each patient quality time with our provider, please arrive at least 15 minutes before your scheduled appointment time.    If you have a lab appointment with the [facility] please come in thru the  Main Entrance and check in at the main information desk  You need to re-schedule your appointment should you arrive 10 or more minutes late.  We strive to give you quality time with our providers, and arriving late affects you and other patients whose appointments are after yours.  Also, if you no show three or more times for appointments you may be dismissed from the clinic at the providers discretion.     Again, thank you for choosing [facility].  Our hope is that these requests will decrease the amount of time that you wait before being seen by our physicians.       _____________________________________________________________  Should you have questions after your visit to [facility], please contact our office at [phone] between the hours of 8:30 a.m. and 4:30 p.m.  Voicemails left after 4:30 p.m. will not be returned until the following business day.  For prescription refill requests, have your pharmacy contact our office.       Resources For Cancer Patients and their Caregivers ? American Cancer Society: Can assist with transportation, wigs, general needs, runs Look Good Feel Better.        787-809-7477 ? Cancer Care: Provides financial assistance, online support groups, medication/co-pay assistance.   1-800-813-HOPE 801-541-5276) ? [place] Assists [place] cancer patients and their families through emotional , educational and financial support.  850-840-0286 ? [place] DSS Where to apply for food stamps, Medicaid and utility assistance. 803 757 2749 ? [facility]: Transportation to medical appointments. 331 172 5173 ? Social Security Administration: May apply for disability if have a Stage IV cancer. (718) 656-8256 409-050-6554 ? [employer], Disability and Transit Services: Assists with nutrition, care and transit needs. [place] Support Programs: '@10RELATIVEDAYS' @ > Cancer Support Group  2nd Tuesday of the month 1pm-2pm, Journey Room  > Creative Journey  3rd Tuesday of the month 1130am-1pm, Journey Room  > Look Good Feel Better  1st Wednesday of the month 10am-12 noon, Journey Room (Call [facility] to register [phone])
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
## 2016-06-07 NOTE — Progress Notes (Signed)
No Zometa given today per Dr. Talbert Cage

## 2016-06-12 ENCOUNTER — Other Ambulatory Visit: Payer: Self-pay

## 2016-06-12 ENCOUNTER — Inpatient Hospital Stay (HOSPITAL_COMMUNITY)
Admission: EM | Admit: 2016-06-12 | Discharge: 2016-06-13 | DRG: 682 | Disposition: A | Discharge status: Home / Self Care | Payer: Medicare Other | Attending: Family Medicine | Admitting: Family Medicine

## 2016-06-12 ENCOUNTER — Encounter (HOSPITAL_COMMUNITY): Payer: Self-pay | Admitting: Emergency Medicine

## 2016-06-12 DIAGNOSIS — Z66 Do not resuscitate: Secondary | ICD-10-CM | POA: Diagnosis not present

## 2016-06-12 DIAGNOSIS — Z8781 Personal history of (healed) traumatic fracture: Secondary | ICD-10-CM | POA: Diagnosis not present

## 2016-06-12 DIAGNOSIS — Z91048 Other nonmedicinal substance allergy status: Secondary | ICD-10-CM

## 2016-06-12 DIAGNOSIS — N189 Chronic kidney disease, unspecified: Secondary | ICD-10-CM | POA: Diagnosis present

## 2016-06-12 DIAGNOSIS — Z79899 Other long term (current) drug therapy: Secondary | ICD-10-CM

## 2016-06-12 DIAGNOSIS — E860 Dehydration: Secondary | ICD-10-CM | POA: Diagnosis present

## 2016-06-12 DIAGNOSIS — D899 Disorder involving the immune mechanism, unspecified: Secondary | ICD-10-CM | POA: Diagnosis present

## 2016-06-12 DIAGNOSIS — Z6823 Body mass index (BMI) 23.0-23.9, adult: Secondary | ICD-10-CM

## 2016-06-12 DIAGNOSIS — D649 Anemia, unspecified: Secondary | ICD-10-CM | POA: Diagnosis present

## 2016-06-12 DIAGNOSIS — Z87820 Personal history of traumatic brain injury: Secondary | ICD-10-CM | POA: Diagnosis not present

## 2016-06-12 DIAGNOSIS — Z888 Allergy status to other drugs, medicaments and biological substances status: Secondary | ICD-10-CM

## 2016-06-12 DIAGNOSIS — R197 Diarrhea, unspecified: Secondary | ICD-10-CM | POA: Diagnosis present

## 2016-06-12 DIAGNOSIS — C9000 Multiple myeloma not having achieved remission: Secondary | ICD-10-CM | POA: Diagnosis not present

## 2016-06-12 DIAGNOSIS — N179 Acute kidney failure, unspecified: Secondary | ICD-10-CM | POA: Diagnosis not present

## 2016-06-12 DIAGNOSIS — E43 Unspecified severe protein-calorie malnutrition: Secondary | ICD-10-CM | POA: Diagnosis not present

## 2016-06-12 DIAGNOSIS — Z9181 History of falling: Secondary | ICD-10-CM | POA: Diagnosis not present

## 2016-06-12 DIAGNOSIS — E872 Acidosis: Secondary | ICD-10-CM | POA: Diagnosis present

## 2016-06-12 DIAGNOSIS — I959 Hypotension, unspecified: Secondary | ICD-10-CM | POA: Diagnosis present

## 2016-06-12 DIAGNOSIS — Z8249 Family history of ischemic heart disease and other diseases of the circulatory system: Secondary | ICD-10-CM

## 2016-06-12 HISTORY — DX: Malignant (primary) neoplasm, unspecified: C80.1

## 2016-06-12 LAB — BASIC METABOLIC PANEL
Anion gap: 10 (ref 5–15)
BUN: 42 mg/dL — ABNORMAL HIGH (ref 6–20)
CHLORIDE: 104 mmol/L (ref 101–111)
CO2: 20 mmol/L — ABNORMAL LOW (ref 22–32)
Calcium: 5.9 mg/dL — CL (ref 8.9–10.3)
Creatinine, Ser: 2.79 mg/dL — ABNORMAL HIGH (ref 0.44–1.00)
GFR calc non Af Amer: 16 mL/min — ABNORMAL LOW (ref >60)
GFR, EST AFRICAN AMERICAN: 19 mL/min — AB (ref >60)
Glucose, Bld: 128 mg/dL — ABNORMAL HIGH (ref 65–99)
POTASSIUM: 3.5 mmol/L (ref 3.5–5.1)
Sodium: 134 mmol/L — ABNORMAL LOW (ref 135–145)

## 2016-06-12 LAB — CBC WITH DIFFERENTIAL/PLATELET
BASOS ABS: 0 10*3/uL (ref 0.0–0.1)
BASOS PCT: 0 %
Eosinophils Absolute: 0.1 10*3/uL (ref 0.0–0.7)
Eosinophils Relative: 3 %
HEMATOCRIT: 29.9 % — AB (ref 36.0–46.0)
HEMOGLOBIN: 10 g/dL — AB (ref 12.0–15.0)
LYMPHS PCT: 21 %
Lymphs Abs: 0.7 10*3/uL (ref 0.7–4.0)
MCH: 29.8 pg (ref 26.0–34.0)
MCHC: 33.4 g/dL (ref 30.0–36.0)
MCV: 89 fL (ref 78.0–100.0)
Monocytes Absolute: 0.2 10*3/uL (ref 0.1–1.0)
Monocytes Relative: 7 %
NEUTROS ABS: 2.5 10*3/uL (ref 1.7–7.7)
NEUTROS PCT: 70 %
Platelets: 101 10*3/uL — ABNORMAL LOW (ref 150–400)
RBC: 3.36 MIL/uL — AB (ref 3.87–5.11)
RDW: 16.9 % — AB (ref 11.5–15.5)
WBC: 3.5 10*3/uL — AB (ref 4.0–10.5)

## 2016-06-12 LAB — URINALYSIS, ROUTINE W REFLEX MICROSCOPIC
Bilirubin Urine: NEGATIVE
GLUCOSE, UA: NEGATIVE mg/dL
HGB URINE DIPSTICK: NEGATIVE
KETONES UR: NEGATIVE mg/dL
LEUKOCYTES UA: NEGATIVE
NITRITE: NEGATIVE
PROTEIN: 30 mg/dL — AB
Specific Gravity, Urine: 1.006 (ref 1.005–1.030)
pH: 6 (ref 5.0–8.0)

## 2016-06-12 LAB — PHOSPHORUS: Phosphorus: 2.5 mg/dL (ref 2.5–4.6)

## 2016-06-12 LAB — COMPREHENSIVE METABOLIC PANEL
ALBUMIN: 3.1 g/dL — AB (ref 3.5–5.0)
ALK PHOS: 110 U/L (ref 38–126)
ALT: 22 U/L (ref 14–54)
AST: 17 U/L (ref 15–41)
Anion gap: 17 — ABNORMAL HIGH (ref 5–15)
BILIRUBIN TOTAL: 0.9 mg/dL (ref 0.3–1.2)
BUN: 44 mg/dL — ABNORMAL HIGH (ref 6–20)
CO2: 17 mmol/L — ABNORMAL LOW (ref 22–32)
Calcium: 6.2 mg/dL — CL (ref 8.9–10.3)
Chloride: 99 mmol/L — ABNORMAL LOW (ref 101–111)
Creatinine, Ser: 2.94 mg/dL — ABNORMAL HIGH (ref 0.44–1.00)
GFR calc Af Amer: 17 mL/min — ABNORMAL LOW (ref >60)
GFR calc non Af Amer: 15 mL/min — ABNORMAL LOW (ref >60)
GLUCOSE: 168 mg/dL — AB (ref 65–99)
POTASSIUM: 3.5 mmol/L (ref 3.5–5.1)
Sodium: 133 mmol/L — ABNORMAL LOW (ref 135–145)
TOTAL PROTEIN: 8.2 g/dL — AB (ref 6.5–8.1)

## 2016-06-12 LAB — TROPONIN I: Troponin I: <0.03 ng/mL (ref <0.03)

## 2016-06-12 LAB — MAGNESIUM: Magnesium: 1.4 mg/dL — ABNORMAL LOW (ref 1.7–2.4)

## 2016-06-12 LAB — TYPE AND SCREEN
ABO/RH(D): A POS
ANTIBODY SCREEN: NEGATIVE

## 2016-06-12 MED ORDER — CALCIUM CARBONATE-VITAMIN D 500-200 MG-UNIT PO TABS
1.0000 | ORAL_TABLET | Freq: Two times a day (BID) | ORAL | Status: DC
  Administered 2016-06-12 – 2016-06-13 (×2): 1 via ORAL
  Filled 2016-06-12 (×2): qty 1

## 2016-06-12 MED ORDER — FAMOTIDINE IN NACL 20-0.9 MG/50ML-% IV SOLN
20.0000 mg | Freq: Once | INTRAVENOUS | Status: AC
  Administered 2016-06-12: 20 mg via INTRAVENOUS
  Filled 2016-06-12: qty 50

## 2016-06-12 MED ORDER — ACETAMINOPHEN 500 MG PO TABS
500.0000 mg | ORAL_TABLET | Freq: Four times a day (QID) | ORAL | Status: DC | PRN
  Administered 2016-06-13: 500 mg via ORAL
  Filled 2016-06-12: qty 1

## 2016-06-12 MED ORDER — ALLOPURINOL 100 MG PO TABS
200.0000 mg | ORAL_TABLET | Freq: Every day | ORAL | Status: DC
  Administered 2016-06-13: 200 mg via ORAL
  Filled 2016-06-12: qty 2

## 2016-06-12 MED ORDER — SODIUM CHLORIDE 0.9 % IV BOLUS (SEPSIS)
1000.0000 mL | Freq: Once | INTRAVENOUS | Status: AC
  Administered 2016-06-12: 1000 mL via INTRAVENOUS

## 2016-06-12 MED ORDER — SODIUM CHLORIDE 0.9 % IV SOLN
INTRAVENOUS | Status: DC
  Administered 2016-06-12 – 2016-06-13 (×2): via INTRAVENOUS

## 2016-06-12 MED ORDER — MAGNESIUM SULFATE 4 GM/100ML IV SOLN
4.0000 g | Freq: Once | INTRAVENOUS | Status: AC
  Administered 2016-06-12: 4 g via INTRAVENOUS

## 2016-06-12 MED ORDER — MAGNESIUM SULFATE 4 GM/100ML IV SOLN
INTRAVENOUS | Status: AC
  Filled 2016-06-12: qty 100

## 2016-06-12 MED ORDER — ONDANSETRON HCL 4 MG/2ML IJ SOLN
4.0000 mg | Freq: Four times a day (QID) | INTRAMUSCULAR | Status: DC | PRN
  Administered 2016-06-12: 4 mg via INTRAVENOUS
  Filled 2016-06-12: qty 2

## 2016-06-12 MED ORDER — ENOXAPARIN SODIUM 30 MG/0.3ML ~~LOC~~ SOLN: 30.0000 mg | SUBCUTANEOUS | Status: DC

## 2016-06-12 MED ORDER — SODIUM BICARBONATE 650 MG PO TABS
650.0000 mg | ORAL_TABLET | Freq: Three times a day (TID) | ORAL | Status: DC
  Administered 2016-06-12 – 2016-06-13 (×2): 650 mg via ORAL
  Filled 2016-06-12 (×2): qty 1

## 2016-06-12 MED ORDER — SODIUM CHLORIDE 0.9 % IV BOLUS (SEPSIS)
500.0000 mL | Freq: Once | INTRAVENOUS | Status: AC
  Administered 2016-06-12: 500 mL via INTRAVENOUS

## 2016-06-12 MED ORDER — SODIUM CHLORIDE 0.9 % IV SOLN
1.0000 g | Freq: Once | INTRAVENOUS | Status: AC
  Administered 2016-06-12: 1 g via INTRAVENOUS
  Filled 2016-06-12: qty 10
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
## 2016-06-12 NOTE — ED Triage Notes (Signed)
Patient c/o generalized fatigue that started this morning. Denies any nausea, vomiting, diarrhea, fevers, or urinary symptoms. Per family patient in [place] x8 days ago for anemia and was given multiple units of blood. Patient also being treated at [facility] for [facility].

## 2016-06-12 NOTE — ED Notes (Signed)
CRITICAL VALUE ALERT  Critical value received:  Calcium 6.2  Date of notification:  06/12/16  Time of notification:  1753  Critical value read back:Yes.    Nurse who received alert:  RMinter, RN  MD notified (1st page):  Dr. Winfred Leeds  Time of first page:  1452  MD notified (2nd page):  Time of second page:  Responding MD:  Dr. Winfred Leeds  Time MD responded:  [phone]

## 2016-06-12 NOTE — Progress Notes (Signed)
CRITICAL VALUE ALERT  Critical value received:  Calcium  Date of notification:  06/12/2016  Time of notification:  23:55  Critical value read back: yes  Nurse who received alert:  Stephannie Peters, RN  MD notified (1st page):  NP Schorr  Time of first page:  23: 1  MD notified (2nd page): NP Schorr  Time of second page:  Responding MD:  NP, no new orders given at this time  Time MD responded:  0000
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
## 2016-06-13 ENCOUNTER — Other Ambulatory Visit (HOSPITAL_COMMUNITY): Payer: Self-pay | Admitting: Oncology

## 2016-06-13 ENCOUNTER — Encounter (HOSPITAL_COMMUNITY): Payer: Self-pay | Admitting: Family Medicine

## 2016-06-13 DIAGNOSIS — N189 Chronic kidney disease, unspecified: Secondary | ICD-10-CM | POA: Diagnosis not present

## 2016-06-13 DIAGNOSIS — Z6823 Body mass index (BMI) 23.0-23.9, adult: Secondary | ICD-10-CM | POA: Diagnosis not present

## 2016-06-13 DIAGNOSIS — N179 Acute kidney failure, unspecified: Secondary | ICD-10-CM | POA: Diagnosis not present

## 2016-06-13 DIAGNOSIS — D649 Anemia, unspecified: Secondary | ICD-10-CM | POA: Diagnosis not present

## 2016-06-13 DIAGNOSIS — C9000 Multiple myeloma not having achieved remission: Secondary | ICD-10-CM

## 2016-06-13 DIAGNOSIS — Z79899 Other long term (current) drug therapy: Secondary | ICD-10-CM | POA: Diagnosis not present

## 2016-06-13 DIAGNOSIS — D899 Disorder involving the immune mechanism, unspecified: Secondary | ICD-10-CM | POA: Diagnosis not present

## 2016-06-13 DIAGNOSIS — R197 Diarrhea, unspecified: Secondary | ICD-10-CM

## 2016-06-13 DIAGNOSIS — Z87820 Personal history of traumatic brain injury: Secondary | ICD-10-CM | POA: Diagnosis not present

## 2016-06-13 DIAGNOSIS — E872 Acidosis: Secondary | ICD-10-CM | POA: Diagnosis not present

## 2016-06-13 DIAGNOSIS — E43 Unspecified severe protein-calorie malnutrition: Secondary | ICD-10-CM | POA: Diagnosis not present

## 2016-06-13 DIAGNOSIS — Z8781 Personal history of (healed) traumatic fracture: Secondary | ICD-10-CM | POA: Diagnosis not present

## 2016-06-13 DIAGNOSIS — I959 Hypotension, unspecified: Secondary | ICD-10-CM | POA: Diagnosis not present

## 2016-06-13 DIAGNOSIS — E860 Dehydration: Secondary | ICD-10-CM | POA: Diagnosis not present

## 2016-06-13 DIAGNOSIS — Z66 Do not resuscitate: Secondary | ICD-10-CM | POA: Diagnosis not present

## 2016-06-13 DIAGNOSIS — Z9181 History of falling: Secondary | ICD-10-CM | POA: Diagnosis not present

## 2016-06-13 LAB — COMPREHENSIVE METABOLIC PANEL
ALBUMIN: 2.4 g/dL — AB (ref 3.5–5.0)
ALT: 16 U/L (ref 14–54)
AST: 13 U/L — ABNORMAL LOW (ref 15–41)
Alkaline Phosphatase: 88 U/L (ref 38–126)
Anion gap: 10 (ref 5–15)
BUN: 38 mg/dL — ABNORMAL HIGH (ref 6–20)
CHLORIDE: 107 mmol/L (ref 101–111)
CO2: 18 mmol/L — AB (ref 22–32)
CREATININE: 2.77 mg/dL — AB (ref 0.44–1.00)
Calcium: 6.1 mg/dL — CL (ref 8.9–10.3)
GFR calc non Af Amer: 16 mL/min — ABNORMAL LOW (ref >60)
GFR, EST AFRICAN AMERICAN: 19 mL/min — AB (ref >60)
Glucose, Bld: 98 mg/dL (ref 65–99)
Potassium: 3.6 mmol/L (ref 3.5–5.1)
SODIUM: 135 mmol/L (ref 135–145)
Total Bilirubin: 0.4 mg/dL (ref 0.3–1.2)
Total Protein: 6.5 g/dL (ref 6.5–8.1)

## 2016-06-13 LAB — CBC WITH DIFFERENTIAL/PLATELET
BASOS PCT: 0 %
Basophils Absolute: 0 10*3/uL (ref 0.0–0.1)
EOS PCT: 3 %
Eosinophils Absolute: 0.1 10*3/uL (ref 0.0–0.7)
HEMATOCRIT: 22.8 % — AB (ref 36.0–46.0)
HEMOGLOBIN: 7.7 g/dL — AB (ref 12.0–15.0)
Lymphocytes Relative: 15 %
Lymphs Abs: 0.5 10*3/uL — ABNORMAL LOW (ref 0.7–4.0)
MCH: 29.8 pg (ref 26.0–34.0)
MCHC: 33.8 g/dL (ref 30.0–36.0)
MCV: 88.4 fL (ref 78.0–100.0)
Monocytes Absolute: 0.1 10*3/uL (ref 0.1–1.0)
Monocytes Relative: 4 %
NEUTROS PCT: 78 %
Neutro Abs: 2.5 10*3/uL (ref 1.7–7.7)
Platelets: 85 10*3/uL — ABNORMAL LOW (ref 150–400)
RBC: 2.58 MIL/uL — ABNORMAL LOW (ref 3.87–5.11)
RDW: 16.7 % — ABNORMAL HIGH (ref 11.5–15.5)
WBC: 3.2 10*3/uL — ABNORMAL LOW (ref 4.0–10.5)

## 2016-06-13 MED ORDER — FERROUS SULFATE 325 (65 FE) MG PO TABS: 325.0000 mg | ORAL_TABLET | Freq: Every day | ORAL | 0 refills | Status: DC

## 2016-06-13 MED ORDER — SODIUM CHLORIDE 0.9 % IV SOLN
1.0000 g | Freq: Once | INTRAVENOUS | Status: AC
  Administered 2016-06-13: 1 g via INTRAVENOUS
  Filled 2016-06-13: qty 10

## 2016-06-13 NOTE — Discharge Instructions (Signed)
Seek medical care or return here if symptoms return, worsen or new problems develop.  See your new primary care provider this week to have your CBC and BMP rechecked.  See your oncologist in 1 week as scheduled to have your labs done and follow up appointment.
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
## 2016-06-13 NOTE — Care Management Note (Signed)
Case Management Note  Patient Details  Name: Deanna Holloway MRN: [id number] Date of Birth: [birth year]  Subjective/Objective:   Adm with AKI/hypocalcemia/progressive weakness. Patient lives with a family friend, she is downstairs, he is upstairs. She shares he does the cooking and cleaning. CM spoke with Deanna Holloway over the phone, who shares patient has been to multiple hospitals  since breaking her arm in February. Recent 10 day stay at [facility]. States she was told she has cancer "brain and bone". He reports she was discharged and he was not called by MD as promised, and he and patient was unclear of plan. He does report patient has been seen by [facility] last week. He also shares she had made an appt with Dr. Sherrie Sport to establish care and missed appt while in the hospital.    Action/Plan: PT eval pending. CM will attempt to schedule appt. With Dr. Sherrie Sport when patient is discharged per patient request. CM following for needs.    Expected Discharge Date:         06/15/2016         Expected Discharge Plan:  [place]  In-House Referral:     Discharge planning Services  CM Consult  Post Acute Care Choice:    Choice offered to:  Patient Deanna Holloway (friend/roommate ))  DME Arranged:    DME Agency:     HH Arranged:    [place] Agency:     Status of Service:  In process, will continue to follow  If discussed at Long Length of Stay Meetings, dates discussed:    Additional Comments:  Kiet Geer, Chauncey Reading, RN 06/13/2016, 12:06 PM

## 2016-06-13 NOTE — Progress Notes (Signed)
Notified Dr. Shanon Brow, pt had critical labs called from laboratory of Calcium 6.1 and Hemoglobin 7.7. Awaiting response.

## 2016-06-13 NOTE — Progress Notes (Signed)
Discharge instructions and prescription given, verbalized understanding, out in stable condition via w/c with staff. 

## 2016-06-13 NOTE — Care Management Obs Status (Signed)
[place] NOTIFICATION   Patient Details  Name: Maeva Dant MRN: [id number] Date of Birth: [birth year]   Medicare Observation Status Notification Given:  Yes    Maxine Fredman, Chauncey Reading, RN 06/13/2016, 12:03 PM

## 2016-06-13 NOTE — Care Management Note (Addendum)
Case Management Note  Patient Details  Name: Deanna Holloway MRN: [id number] Date of Birth: 03/12/1946    Expected Discharge Date:  06/13/16               Expected Discharge Plan:  [place]  In-House Referral:  NA  Discharge planning Services  CM Consult, Follow-up appt scheduled  Post Acute Care Choice:  NA Choice offered to:  NA [name] (friend/roommate ))  DME Arranged:    DME Agency:     HH Arranged:    HH Agency:     Status of Service:  Completed, signed off  If discussed at [employer] of Stay Meetings, dates discussed:    Additional Comments: Patient discharging home today. No HH needed. CM arranged f/u appt with Dr. Sherrie Sport for 06/16/2016 at 4 pm. No other CM needs.  Keelee Yankey, Chauncey Reading, RN 06/13/2016, 3:00 PM

## 2016-06-14 ENCOUNTER — Telehealth (HOSPITAL_COMMUNITY): Payer: Self-pay

## 2016-06-14 NOTE — Telephone Encounter (Signed)
-----   Message from Baird Cancer, PA-C sent at 06/13/2016  8:13 PM EDT ----- Orders are in  ----- Message ----- From: Darlina Guys, LPN Sent: [id number]   4:15 PM To: Baird Cancer, PA-C  Patient is in the hospital and a nurse from the floor called and left message with Everitt Amber to order labs on 3/29 when she sees you. Sharyn Lull said the nurse said to order a CMET because her Iron was low but that isn't all she would need. I told Sharyn Lull we would just ask you what to order or would you want to see her first and get labs after appt.  Thanks, MB

## 2016-06-14 NOTE — Telephone Encounter (Signed)
Lab appointment made for 3/29 at [place] to notify patient and had to leave a message. Labs are linked.

## 2016-06-15 ENCOUNTER — Ambulatory Visit: Payer: Medicare Other | Admitting: Family Medicine

## 2016-06-16 DIAGNOSIS — B961 Klebsiella pneumoniae [K. pneumoniae] as the cause of diseases classified elsewhere: Secondary | ICD-10-CM | POA: Diagnosis not present

## 2016-06-16 DIAGNOSIS — D508 Other iron deficiency anemias: Secondary | ICD-10-CM | POA: Diagnosis not present

## 2016-06-16 DIAGNOSIS — M1009 Idiopathic gout, multiple sites: Secondary | ICD-10-CM | POA: Diagnosis not present

## 2016-06-16 DIAGNOSIS — R5383 Other fatigue: Secondary | ICD-10-CM | POA: Diagnosis not present

## 2016-06-17 ENCOUNTER — Other Ambulatory Visit: Payer: Self-pay | Admitting: Radiology

## 2016-06-17 DIAGNOSIS — R5383 Other fatigue: Secondary | ICD-10-CM | POA: Diagnosis not present

## 2016-06-17 DIAGNOSIS — D508 Other iron deficiency anemias: Secondary | ICD-10-CM | POA: Diagnosis not present

## 2016-06-17 DIAGNOSIS — M1009 Idiopathic gout, multiple sites: Secondary | ICD-10-CM | POA: Diagnosis not present

## 2016-06-17 DIAGNOSIS — Z79899 Other long term (current) drug therapy: Secondary | ICD-10-CM | POA: Diagnosis not present

## 2016-06-17 DIAGNOSIS — B961 Klebsiella pneumoniae [K. pneumoniae] as the cause of diseases classified elsewhere: Secondary | ICD-10-CM | POA: Diagnosis not present

## 2016-06-20 ENCOUNTER — Ambulatory Visit (HOSPITAL_COMMUNITY): Admission: RE | Admit: 2016-06-20 | Payer: Medicare Other | Source: Ambulatory Visit

## 2016-06-22 DIAGNOSIS — E8809 Other disorders of plasma-protein metabolism, not elsewhere classified: Secondary | ICD-10-CM | POA: Diagnosis not present

## 2016-06-22 DIAGNOSIS — Z888 Allergy status to other drugs, medicaments and biological substances status: Secondary | ICD-10-CM | POA: Diagnosis not present

## 2016-06-22 DIAGNOSIS — Z79899 Other long term (current) drug therapy: Secondary | ICD-10-CM | POA: Diagnosis not present

## 2016-06-22 DIAGNOSIS — N178 Other acute kidney failure: Secondary | ICD-10-CM | POA: Diagnosis not present

## 2016-06-22 DIAGNOSIS — R42 Dizziness and giddiness: Secondary | ICD-10-CM | POA: Diagnosis not present

## 2016-06-22 DIAGNOSIS — Z780 Asymptomatic menopausal state: Secondary | ICD-10-CM | POA: Diagnosis not present

## 2016-06-22 DIAGNOSIS — D649 Anemia, unspecified: Secondary | ICD-10-CM | POA: Diagnosis not present

## 2016-06-22 DIAGNOSIS — Z8249 Family history of ischemic heart disease and other diseases of the circulatory system: Secondary | ICD-10-CM | POA: Diagnosis not present

## 2016-06-22 DIAGNOSIS — N179 Acute kidney failure, unspecified: Secondary | ICD-10-CM | POA: Diagnosis not present

## 2016-06-22 DIAGNOSIS — N189 Chronic kidney disease, unspecified: Secondary | ICD-10-CM | POA: Diagnosis not present

## 2016-06-22 DIAGNOSIS — Z818 Family history of other mental and behavioral disorders: Secondary | ICD-10-CM | POA: Diagnosis not present

## 2016-06-22 DIAGNOSIS — D6489 Other specified anemias: Secondary | ICD-10-CM | POA: Diagnosis not present

## 2016-06-22 DIAGNOSIS — R262 Difficulty in walking, not elsewhere classified: Secondary | ICD-10-CM | POA: Diagnosis not present

## 2016-06-23 ENCOUNTER — Ambulatory Visit (HOSPITAL_COMMUNITY): Payer: Medicare Other | Admitting: Oncology

## 2016-06-23 ENCOUNTER — Other Ambulatory Visit (HOSPITAL_COMMUNITY): Payer: Medicare Other

## 2016-06-23 DIAGNOSIS — E8809 Other disorders of plasma-protein metabolism, not elsewhere classified: Secondary | ICD-10-CM | POA: Diagnosis not present

## 2016-06-23 DIAGNOSIS — N178 Other acute kidney failure: Secondary | ICD-10-CM | POA: Diagnosis not present

## 2016-06-23 DIAGNOSIS — D649 Anemia, unspecified: Secondary | ICD-10-CM | POA: Diagnosis not present

## 2016-06-23 DIAGNOSIS — N179 Acute kidney failure, unspecified: Secondary | ICD-10-CM | POA: Diagnosis not present

## 2016-06-23 DIAGNOSIS — N189 Chronic kidney disease, unspecified: Secondary | ICD-10-CM | POA: Diagnosis not present

## 2016-06-23 DIAGNOSIS — Z780 Asymptomatic menopausal state: Secondary | ICD-10-CM | POA: Diagnosis not present

## 2016-06-23 DIAGNOSIS — D6489 Other specified anemias: Secondary | ICD-10-CM | POA: Diagnosis not present

## 2016-07-04 DIAGNOSIS — D508 Other iron deficiency anemias: Secondary | ICD-10-CM | POA: Diagnosis not present

## 2016-07-04 DIAGNOSIS — Z13220 Encounter for screening for lipoid disorders: Secondary | ICD-10-CM | POA: Diagnosis not present

## 2016-07-04 DIAGNOSIS — R7303 Prediabetes: Secondary | ICD-10-CM | POA: Diagnosis not present

## 2016-07-04 DIAGNOSIS — Z0000 Encounter for general adult medical examination without abnormal findings: Secondary | ICD-10-CM | POA: Diagnosis not present

## 2016-07-04 DIAGNOSIS — M1009 Idiopathic gout, multiple sites: Secondary | ICD-10-CM | POA: Diagnosis not present

## 2016-07-04 DIAGNOSIS — Z130 Encounter for screening for diseases of the blood and blood-forming organs and certain disorders involving the immune mechanism: Secondary | ICD-10-CM | POA: Diagnosis not present

## 2016-07-04 DIAGNOSIS — B961 Klebsiella pneumoniae [K. pneumoniae] as the cause of diseases classified elsewhere: Secondary | ICD-10-CM | POA: Diagnosis not present

## 2016-07-04 DIAGNOSIS — Z79899 Other long term (current) drug therapy: Secondary | ICD-10-CM | POA: Diagnosis not present

## 2016-07-11 ENCOUNTER — Other Ambulatory Visit (HOSPITAL_COMMUNITY)
Admission: RE | Admit: 2016-07-11 | Discharge: 2016-07-11 | Disposition: A | Discharge status: Home / Self Care | Payer: Medicare Other | Source: Ambulatory Visit | Attending: Diagnostic Radiology | Admitting: Diagnostic Radiology

## 2016-07-11 DIAGNOSIS — D4989 Neoplasm of unspecified behavior of other specified sites: Secondary | ICD-10-CM | POA: Diagnosis not present

## 2016-07-11 DIAGNOSIS — D696 Thrombocytopenia, unspecified: Secondary | ICD-10-CM | POA: Insufficient documentation

## 2016-07-11 DIAGNOSIS — C9000 Multiple myeloma not having achieved remission: Secondary | ICD-10-CM | POA: Diagnosis not present

## 2016-07-11 DIAGNOSIS — D649 Anemia, unspecified: Secondary | ICD-10-CM | POA: Insufficient documentation

## 2016-07-11 DIAGNOSIS — R791 Abnormal coagulation profile: Secondary | ICD-10-CM | POA: Diagnosis not present

## 2016-07-15 DIAGNOSIS — Z79899 Other long term (current) drug therapy: Secondary | ICD-10-CM | POA: Diagnosis not present

## 2016-07-15 DIAGNOSIS — D508 Other iron deficiency anemias: Secondary | ICD-10-CM | POA: Diagnosis not present

## 2016-07-20 LAB — CHROMOSOME ANALYSIS, BONE MARROW

## 2016-07-20 LAB — TISSUE HYBRIDIZATION (BONE MARROW)-NCBH

## 2016-07-21 ENCOUNTER — Ambulatory Visit (HOSPITAL_COMMUNITY): Payer: Medicare Other | Admitting: Adult Health

## 2016-07-25 DIAGNOSIS — D508 Other iron deficiency anemias: Secondary | ICD-10-CM | POA: Diagnosis not present

## 2016-07-25 DIAGNOSIS — D649 Anemia, unspecified: Secondary | ICD-10-CM | POA: Diagnosis not present

## 2016-07-25 DIAGNOSIS — R531 Weakness: Secondary | ICD-10-CM | POA: Diagnosis not present

## 2016-07-26 DIAGNOSIS — D649 Anemia, unspecified: Secondary | ICD-10-CM | POA: Diagnosis not present

## 2016-07-26 DIAGNOSIS — R531 Weakness: Secondary | ICD-10-CM | POA: Diagnosis not present

## 2016-08-01 DIAGNOSIS — E559 Vitamin D deficiency, unspecified: Secondary | ICD-10-CM | POA: Diagnosis not present

## 2016-08-01 DIAGNOSIS — C9000 Multiple myeloma not having achieved remission: Secondary | ICD-10-CM | POA: Diagnosis not present

## 2016-08-01 DIAGNOSIS — D696 Thrombocytopenia, unspecified: Secondary | ICD-10-CM | POA: Diagnosis not present

## 2016-08-01 DIAGNOSIS — D649 Anemia, unspecified: Secondary | ICD-10-CM | POA: Diagnosis not present

## 2016-08-02 DIAGNOSIS — E559 Vitamin D deficiency, unspecified: Secondary | ICD-10-CM | POA: Diagnosis not present

## 2016-08-02 DIAGNOSIS — C9000 Multiple myeloma not having achieved remission: Secondary | ICD-10-CM | POA: Diagnosis not present

## 2016-08-09 DIAGNOSIS — C9000 Multiple myeloma not having achieved remission: Secondary | ICD-10-CM | POA: Diagnosis not present

## 2016-08-09 DIAGNOSIS — M810 Age-related osteoporosis without current pathological fracture: Secondary | ICD-10-CM | POA: Diagnosis not present

## 2016-08-10 DIAGNOSIS — C9000 Multiple myeloma not having achieved remission: Secondary | ICD-10-CM | POA: Diagnosis not present

## 2016-08-10 DIAGNOSIS — M810 Age-related osteoporosis without current pathological fracture: Secondary | ICD-10-CM | POA: Diagnosis not present

## 2016-08-23 DIAGNOSIS — E041 Nontoxic single thyroid nodule: Secondary | ICD-10-CM | POA: Diagnosis not present

## 2016-08-23 DIAGNOSIS — N289 Disorder of kidney and ureter, unspecified: Secondary | ICD-10-CM | POA: Diagnosis not present

## 2016-08-26 DIAGNOSIS — M1009 Idiopathic gout, multiple sites: Secondary | ICD-10-CM | POA: Diagnosis not present

## 2016-08-26 DIAGNOSIS — D508 Other iron deficiency anemias: Secondary | ICD-10-CM | POA: Diagnosis not present

## 2016-08-30 DIAGNOSIS — R531 Weakness: Secondary | ICD-10-CM | POA: Diagnosis not present

## 2016-08-30 DIAGNOSIS — D649 Anemia, unspecified: Secondary | ICD-10-CM | POA: Diagnosis not present

## 2016-08-31 DIAGNOSIS — R531 Weakness: Secondary | ICD-10-CM | POA: Diagnosis not present

## 2016-08-31 DIAGNOSIS — D649 Anemia, unspecified: Secondary | ICD-10-CM | POA: Diagnosis not present

## 2016-09-14 ENCOUNTER — Other Ambulatory Visit: Payer: Self-pay | Admitting: *Deleted

## 2016-09-15 ENCOUNTER — Telehealth: Payer: Self-pay | Admitting: *Deleted

## 2016-09-15 ENCOUNTER — Other Ambulatory Visit (HOSPITAL_BASED_OUTPATIENT_CLINIC_OR_DEPARTMENT_OTHER): Payer: Medicare Other

## 2016-09-15 ENCOUNTER — Ambulatory Visit (HOSPITAL_BASED_OUTPATIENT_CLINIC_OR_DEPARTMENT_OTHER): Payer: Medicare Other | Admitting: Hematology & Oncology

## 2016-09-15 ENCOUNTER — Ambulatory Visit: Payer: Medicare Other

## 2016-09-15 ENCOUNTER — Ambulatory Visit (HOSPITAL_BASED_OUTPATIENT_CLINIC_OR_DEPARTMENT_OTHER): Payer: Medicare Other

## 2016-09-15 ENCOUNTER — Encounter: Payer: Self-pay | Admitting: Hematology & Oncology

## 2016-09-15 VITALS — BP 124/58 | HR 95 | Temp 97.7°F | Resp 18 | Wt 121.0 lb

## 2016-09-15 DIAGNOSIS — C9000 Multiple myeloma not having achieved remission: Secondary | ICD-10-CM | POA: Diagnosis not present

## 2016-09-15 DIAGNOSIS — Z7189 Other specified counseling: Secondary | ICD-10-CM

## 2016-09-15 HISTORY — DX: Other specified counseling: Z71.89

## 2016-09-15 HISTORY — DX: Multiple myeloma not having achieved remission: C90.00

## 2016-09-15 LAB — CBC WITH DIFFERENTIAL (CANCER CENTER ONLY)
BASO#: 0 10*3/uL (ref 0.0–0.2)
BASO%: 0.9 % (ref 0.0–2.0)
EOS%: 1.5 % (ref 0.0–7.0)
Eosinophils Absolute: 0.1 10*3/uL (ref 0.0–0.5)
HEMATOCRIT: 24.1 % — AB (ref 34.8–46.6)
HGB: 8 g/dL — ABNORMAL LOW (ref 11.6–15.9)
LYMPH#: 1.2 10*3/uL (ref 0.9–3.3)
LYMPH%: 24.8 % (ref 14.0–48.0)
MCH: 31 pg (ref 26.0–34.0)
MCHC: 33.2 g/dL (ref 32.0–36.0)
MCV: 93 fL (ref 81–101)
MONO#: 0.5 10*3/uL (ref 0.1–0.9)
MONO%: 9.6 % (ref 0.0–13.0)
NEUT#: 3 10*3/uL (ref 1.5–6.5)
NEUT%: 63.2 % (ref 39.6–80.0)
PLATELETS: 90 10*3/uL — AB (ref 145–400)
RBC: 2.58 10*6/uL — ABNORMAL LOW (ref 3.70–5.32)
RDW: 14.7 % (ref 11.1–15.7)
WBC: 4.7 10*3/uL (ref 3.9–10.0)

## 2016-09-15 LAB — CMP (CANCER CENTER ONLY)
ALK PHOS: 69 U/L (ref 26–84)
ALT: 22 U/L (ref 10–47)
AST: 32 U/L (ref 11–38)
Albumin: 2.6 g/dL — ABNORMAL LOW (ref 3.3–5.5)
BILIRUBIN TOTAL: 0.6 mg/dL (ref 0.20–1.60)
BUN: 36 mg/dL — AB (ref 7–22)
CALCIUM: 14.9 mg/dL — AB (ref 8.0–10.3)
CO2: 29 meq/L (ref 18–33)
Chloride: 97 mEq/L — ABNORMAL LOW (ref 98–108)
Creat: 1.7 mg/dl — ABNORMAL HIGH (ref 0.6–1.2)
GLUCOSE: 102 mg/dL (ref 73–118)
Potassium: 3.9 mEq/L (ref 3.3–4.7)
Sodium: 133 mEq/L (ref 128–145)
Total Protein: 9 g/dL — ABNORMAL HIGH (ref 6.4–8.1)

## 2016-09-15 LAB — LACTATE DEHYDROGENASE: LDH: 144 U/L (ref 125–245)

## 2016-09-15 LAB — CHCC SATELLITE - SMEAR

## 2016-09-15 MED ORDER — ZOLEDRONIC ACID 4 MG/100ML IV SOLN
4.0000 mg | Freq: Once | INTRAVENOUS | Status: AC
  Administered 2016-09-15: 4 mg via INTRAVENOUS
  Filled 2016-09-15: qty 100

## 2016-09-15 MED ORDER — SODIUM CHLORIDE 0.9 % IV SOLN
500.0000 mL | Freq: Once | INTRAVENOUS | Status: AC
  Administered 2016-09-15: 500 mL via INTRAVENOUS

## 2016-09-15 MED ORDER — LENALIDOMIDE 15 MG PO CAPS: ORAL_CAPSULE | ORAL | 4 refills | Status: DC
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
## 2016-09-15 NOTE — Patient Instructions (Signed)

## 2016-09-15 NOTE — Telephone Encounter (Signed)
Critical Value Calcium 14.9 Dr Marin Olp notified. No orders at this time

## 2016-09-16 ENCOUNTER — Encounter: Payer: Self-pay | Admitting: Hematology & Oncology

## 2016-09-16 LAB — BETA 2 MICROGLOBULIN, SERUM: BETA 2: 14.7 mg/L — AB (ref 0.6–2.4)

## 2016-09-16 LAB — KAPPA/LAMBDA LIGHT CHAINS
Ig Kappa Free Light Chain: 18.7 mg/L (ref 3.3–19.4)
Ig Lambda Free Light Chain: 4395.8 mg/L — ABNORMAL HIGH (ref 5.7–26.3)
Kappa/Lambda FluidC Ratio: 0 — ABNORMAL LOW (ref 0.26–1.65)

## 2016-09-16 LAB — IGG, IGA, IGM
IGG (IMMUNOGLOBIN G), SERUM: 620 mg/dL — AB (ref 700–1600)
IGM (IMMUNOGLOBIN M), SRM: 7 mg/dL — AB (ref 26–217)

## 2016-09-19 LAB — PROTEIN ELECTROPHORESIS, SERUM, WITH REFLEX
A/G Ratio: 0.7 (ref 0.7–1.7)
Albumin: 3.5 g/dL (ref 2.9–4.4)
Alpha 1: 0.2 g/dL (ref 0.0–0.4)
Alpha 2: 0.5 g/dL (ref 0.4–1.0)
BETA: 3.9 g/dL — AB (ref 0.7–1.3)
GAMMA GLOBULIN: 0.6 g/dL (ref 0.4–1.8)
GLOBULIN, TOTAL: 5.2 g/dL — AB (ref 2.2–3.9)
Interpretation(See Below): 0
M-Spike, %: 3.3 g/dL — ABNORMAL HIGH
Total Protein: 8.7 g/dL — ABNORMAL HIGH (ref 6.0–8.5)

## 2016-09-21 ENCOUNTER — Other Ambulatory Visit: Payer: Self-pay | Admitting: *Deleted

## 2016-09-21 DIAGNOSIS — C9000 Multiple myeloma not having achieved remission: Secondary | ICD-10-CM

## 2016-09-22 ENCOUNTER — Ambulatory Visit (HOSPITAL_BASED_OUTPATIENT_CLINIC_OR_DEPARTMENT_OTHER): Payer: Medicare Other | Admitting: Hematology & Oncology

## 2016-09-22 ENCOUNTER — Other Ambulatory Visit: Payer: Self-pay

## 2016-09-22 ENCOUNTER — Other Ambulatory Visit (HOSPITAL_BASED_OUTPATIENT_CLINIC_OR_DEPARTMENT_OTHER): Payer: Medicare Other

## 2016-09-22 ENCOUNTER — Ambulatory Visit (HOSPITAL_BASED_OUTPATIENT_CLINIC_OR_DEPARTMENT_OTHER): Payer: Medicare Other

## 2016-09-22 VITALS — BP 122/55 | HR 89 | Temp 98.8°F | Resp 18 | Wt 122.0 lb

## 2016-09-22 DIAGNOSIS — C9000 Multiple myeloma not having achieved remission: Secondary | ICD-10-CM

## 2016-09-22 DIAGNOSIS — Z5112 Encounter for antineoplastic immunotherapy: Secondary | ICD-10-CM

## 2016-09-22 LAB — CMP (CANCER CENTER ONLY)
ALK PHOS: 83 U/L (ref 26–84)
ALT: 20 U/L (ref 10–47)
AST: 29 U/L (ref 11–38)
Albumin: 2.6 g/dL — ABNORMAL LOW (ref 3.3–5.5)
BUN: 26 mg/dL — AB (ref 7–22)
CALCIUM: 12.8 mg/dL — AB (ref 8.0–10.3)
CHLORIDE: 99 meq/L (ref 98–108)
CO2: 29 mEq/L (ref 18–33)
Creat: 1.5 mg/dl — ABNORMAL HIGH (ref 0.6–1.2)
GLUCOSE: 96 mg/dL (ref 73–118)
POTASSIUM: 4.1 meq/L (ref 3.3–4.7)
Sodium: 134 mEq/L (ref 128–145)
Total Bilirubin: 0.6 mg/dl (ref 0.20–1.60)
Total Protein: 8.9 g/dL — ABNORMAL HIGH (ref 6.4–8.1)

## 2016-09-22 LAB — CBC WITH DIFFERENTIAL (CANCER CENTER ONLY)
BASO#: 0 10*3/uL (ref 0.0–0.2)
BASO%: 1 % (ref 0.0–2.0)
EOS ABS: 0.1 10*3/uL (ref 0.0–0.5)
EOS%: 1.5 % (ref 0.0–7.0)
HEMATOCRIT: 22.2 % — AB (ref 34.8–46.6)
HGB: 7.2 g/dL — ABNORMAL LOW (ref 11.6–15.9)
LYMPH#: 1.2 10*3/uL (ref 0.9–3.3)
LYMPH%: 29.8 % (ref 14.0–48.0)
MCH: 30.9 pg (ref 26.0–34.0)
MCHC: 32.4 g/dL (ref 32.0–36.0)
MCV: 95 fL (ref 81–101)
MONO#: 0.4 10*3/uL (ref 0.1–0.9)
MONO%: 9.9 % (ref 0.0–13.0)
NEUT#: 2.3 10*3/uL (ref 1.5–6.5)
NEUT%: 57.8 % (ref 39.6–80.0)
Platelets: 93 10*3/uL — ABNORMAL LOW (ref 145–400)
RBC: 2.33 10*6/uL — ABNORMAL LOW (ref 3.70–5.32)
RDW: 14.9 % (ref 11.1–15.7)
WBC: 3.9 10*3/uL (ref 3.9–10.0)

## 2016-09-22 LAB — TECHNOLOGIST REVIEW CHCC SATELLITE

## 2016-09-22 MED ORDER — PROCHLORPERAZINE MALEATE 10 MG PO TABS: 10.0000 mg | ORAL_TABLET | Freq: Four times a day (QID) | ORAL | 0 refills | Status: DC | PRN

## 2016-09-22 MED ORDER — BORTEZOMIB CHEMO SQ INJECTION 3.5 MG (2.5MG/ML)
1.3000 mg/m2 | Freq: Once | INTRAMUSCULAR | Status: AC
  Administered 2016-09-22: 2 mg via SUBCUTANEOUS
  Filled 2016-09-22: qty 2

## 2016-09-22 MED ORDER — PROCHLORPERAZINE MALEATE 10 MG PO TABS
10.0000 mg | ORAL_TABLET | Freq: Once | ORAL | Status: AC
  Administered 2016-09-22: 10 mg via ORAL

## 2016-09-22 MED ORDER — PROCHLORPERAZINE MALEATE 10 MG PO TABS
ORAL_TABLET | ORAL | Status: AC
  Filled 2016-09-22: qty 1

## 2016-09-22 NOTE — Progress Notes (Signed)
OK to treat with platelets of 93 & hgb of 7.2 per VOV Dr Marin Olp. dph

## 2016-09-22 NOTE — Patient Instructions (Signed)
[facility] Discharge Instructions for Patients Receiving Chemotherapy  Today you received the following chemotherapy agents Velcade  To help prevent nausea and vomiting after your treatment, we encourage you to take your nausea medication as prescribed by MD   If you develop nausea and vomiting that is not controlled by your nausea medication, call the clinic.   BELOW ARE SYMPTOMS THAT SHOULD BE REPORTED IMMEDIATELY:  *FEVER GREATER THAN 100.5 F  *CHILLS WITH OR WITHOUT FEVER  NAUSEA AND VOMITING THAT IS NOT CONTROLLED WITH YOUR NAUSEA MEDICATION  *UNUSUAL SHORTNESS OF BREATH  *UNUSUAL BRUISING OR BLEEDING  TENDERNESS IN MOUTH AND THROAT WITH OR WITHOUT PRESENCE OF ULCERS  *URINARY PROBLEMS  *BOWEL PROBLEMS  UNUSUAL RASH Items with * indicate a potential emergency and should be followed up as soon as possible.  Feel free to call the clinic you have any questions or concerns. The clinic phone number is [phone].  Please show the [place] at check-in to the Emergency Department and triage nurse.   Bortezomib injection What is this medicine? BORTEZOMIB (bor TEZ oh mib) is a medicine that targets proteins in cancer cells and stops the cancer cells from growing. It is used to treat multiple myeloma and mantle-cell lymphoma. This medicine may be used for other purposes; ask your health care provider or pharmacist if you have questions. COMMON BRAND NAME(S): Velcade What should I tell my health care provider before I take this medicine? They need to know if you have any of these conditions: -diabetes -heart disease -irregular heartbeat -liver disease -on hemodialysis -low blood counts, like low white blood cells, platelets, or hemoglobin -peripheral neuropathy -taking medicine for blood pressure -an unusual or allergic reaction to bortezomib, mannitol, boron, other medicines, foods, dyes, or preservatives -pregnant or trying to get  pregnant -breast-feeding How should I use this medicine? This medicine is for injection into a vein or for injection under the skin. It is given by a health care professional in a hospital or clinic setting. Talk to your pediatrician regarding the use of this medicine in children. Special care may be needed. Overdosage: If you think you have taken too much of this medicine contact a poison control center or emergency room at once. NOTE: This medicine is only for you. Do not share this medicine with others. What if I miss a dose? It is important not to miss your dose. Call your doctor or health care professional if you are unable to keep an appointment. What may interact with this medicine? This medicine may interact with the following medications: -ketoconazole -rifampin -ritonavir -St. John's Wort This list may not describe all possible interactions. Give your health care provider a list of all the medicines, herbs, non-prescription drugs, or dietary supplements you use. Also tell them if you smoke, drink alcohol, or use illegal drugs. Some items may interact with your medicine. What should I watch for while using this medicine? You may get drowsy or dizzy. Do not drive, use machinery, or do anything that needs mental alertness until you know how this medicine affects you. Do not stand or sit up quickly, especially if you are an older patient. This reduces the risk of dizzy or fainting spells. In some cases, you may be given additional medicines to help with side effects. Follow all directions for their use. Call your doctor or health care professional for advice if you get a fever, chills or sore throat, or other symptoms of a cold or flu. Do  not treat yourself. This drug decreases your body's ability to fight infections. Try to avoid being around people who are sick. This medicine may increase your risk to bruise or bleed. Call your doctor or health care professional if you notice any unusual  bleeding. You may need blood work done while you are taking this medicine. In some patients, this medicine may cause a serious brain infection that may cause death. If you have any problems seeing, thinking, speaking, walking, or standing, tell your doctor right away. If you cannot reach your doctor, urgently seek other source of medical care. Check with your doctor or health care professional if you get an attack of severe diarrhea, nausea and vomiting, or if you sweat a lot. The loss of too much body fluid can make it dangerous for you to take this medicine. Do not become pregnant while taking this medicine or for at least 2 months after stopping it. Women should inform their doctor if they wish to become pregnant or think they might be pregnant. Men should not father a child while taking this medicine and for at least 2 months after stopping it. There is a potential for serious side effects to an unborn child. Talk to your health care professional or pharmacist for more information. Do not breast-feed an infant while taking this medicine or for 2 months after stopping it. This medicine may interfere with the ability to have a child. You should talk with your doctor or health care professional if you are concerned about your fertility. What side effects may I notice from receiving this medicine? Side effects that you should report to your doctor or health care professional as soon as possible: -allergic reactions like skin rash, itching or hives, swelling of the face, lips, or tongue -breathing problems -changes in hearing -changes in vision -fast, irregular heartbeat -feeling faint or lightheaded, falls -pain, tingling, numbness in the hands or feet -right upper belly pain -seizures -swelling of the ankles, feet, hands -unusual bleeding or bruising -unusually weak or tired -vomiting -yellowing of the eyes or skin Side effects that usually do not require medical attention (report to your  doctor or health care professional if they continue or are bothersome): -changes in emotions or moods -constipation -diarrhea -loss of appetite -headache -irritation at site where injected -nausea This list may not describe all possible side effects. Call your doctor for medical advice about side effects. You may report side effects to FDA at 1-800-FDA-1088. Where should I keep my medicine? This drug is given in a hospital or clinic and will not be stored at home. NOTE: This sheet is a summary. It may not cover all possible information. If you have questions about this medicine, talk to your doctor, pharmacist, or health care provider.  2018 Elsevier/Gold Standard (2016-02-11 15:53:51)

## 2016-09-22 NOTE — Progress Notes (Addendum)
Hematology and Oncology Follow Up Visit  Deanna Holloway [id number] 29-May-1945 71 y.o. 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 This is Deanna Holloway second office visit. We saw Deanna Holloway last week for about 5 or 6 hours. She was really in bad shape. She came in a wheelchair. She had severe hypercalcemia. We finally got Deanna Holloway to read to IV fluids and Xgeva. This is helped.  Deanna Holloway companion says that she has done very well this week. He has noticed a nice improvement in Deanna Holloway overall physical state.  We saw Deanna Holloway, we did Deanna Holloway initial myeloma studies. Deanna Holloway M spike was 3.3 g/dL. Deanna Holloway IgA level was 4318 milligrams per deciliter. Deanna Holloway lambda light chain was 441 g/dL.  She is eating a little bit better. She is trying to stay a little bit more hydrated.  She is still very suspicious of any treatment. I reassured Deanna Holloway that what we will have Deanna Holloway on is the now mom treatment that really should have very few, if any side effects.  Hopefully, she will be oh to get Deanna Holloway Revlimid soon.  Deanna Holloway performance status is ECOG 2.  Medications:  Current Outpatient Prescriptions:  .  acetaminophen (TYLENOL) 500 MG tablet, Take 500 mg by mouth every 6 (six) hours as needed., Disp: , Rfl:  .  calcium-vitamin D (OSCAL WITH D) 500-200 MG-UNIT tablet, Take 1 tablet by mouth 2 (two) times daily. , Disp: , Rfl:  .  lenalidomide (REVLIMID) 15 MG capsule, Take 1 pill daily with food for 21 days then off 7 days., Disp: 21 capsule, Rfl: 4 .  sodium bicarbonate 650 MG tablet, Take 650 mg by mouth 3 (three) times daily., Disp: , Rfl:   Allergies:  Allergies  Allergen Reactions  . Adhesive [Tape]   . Lasix [Furosemide]   . Nickel     Past Medical History, Surgical history, Social  history, and Family History were reviewed and updated.  Review of Systems:  As above  Physical Exam:  weight is 122 lb (55.3 kg). Deanna Holloway oral temperature is 98.8 F (37.1 C). Deanna Holloway blood pressure is 122/55 (abnormal) and Deanna Holloway pulse is 89. Deanna Holloway respiration is 18 and oxygen saturation is 100%.   Wt Readings from Last 3 Encounters:  09/22/16 122 lb (55.3 kg)  09/15/16 121 lb (54.9 kg)  06/12/16 134 lb (60.8 kg)     Head and neck exam shows no ocular or oral lesions. She has no palpable cervical or supraclavicular lymph nodes. Lungs are clear bilaterally. Cardiac exam regular rate and rhythm with no murmurs, rubs or bruits. Abdomen is soft. She has good bowel sounds. She has no fluid wave. There is no palpable liver or spleen tip. Back exam shows no tenderness over the spine, ribs or hips. Extremity shows no clubbing, cyanosis or edema. Skin exam shows no rashes, ecchymoses or petechia. Neurological exam shows no focal neurological deficits.  Lab Results  Component Value Date   WBC 3.9 09/22/2016   HGB 7.2 (L) 09/22/2016   HCT 22.2 (L) 09/22/2016   MCV 95 09/22/2016   PLT 93 (L) 09/22/2016     Chemistry      Component Value Date/Time   NA 134 09/22/2016 1108   K 4.1 09/22/2016 1108  CL 99 09/22/2016 1108   CO2 29 09/22/2016 1108   BUN 26 (H) 09/22/2016 1108   CREATININE 1.5 (H) 09/22/2016 1108      Component Value Date/Time   CALCIUM 12.8 (H) 09/22/2016 1108   ALKPHOS 83 09/22/2016 1108   AST 29 09/22/2016 1108   ALT 20 09/22/2016 1108   BILITOT 0.60 09/22/2016 1108         Impression and Plan: Deanna Holloway is a 71 year old white female with IgA lambda myeloma. Clearly, it is the light chain that we have to worry about.  At some point, we really need to get the cytogenetics report from Deanna Holloway bone marrow. I would have to imagine that there will be abnormal cytogenetics.  We will is started with Deanna Holloway Velcade today. We will have to see where things stand with the  Revlimid.  She has got Zometa last week. As such we do not have to give Deanna Holloway anything until July.  Deanna Holloway calcium has come down which is good to see. Hopefully, he will continue to come down.  We have to check Deanna Holloway blood counts and labs weekly.  If Deanna Holloway hemoglobin continues to drop, particularly if he gets below 7.0, then we will have to transfuse Deanna Holloway.  We will see Deanna Holloway back next week. Again we have to follow Deanna Holloway very closely.  We spent about 40 minutes with Deanna Holloway today. This is still a very complicated situation, both physically and also emotionally.   Volanda Napoleon, MD 6/28/201812:15 PM

## 2016-09-29 ENCOUNTER — Ambulatory Visit (HOSPITAL_COMMUNITY)
Admission: RE | Admit: 2016-09-29 | Discharge: 2016-09-29 | Disposition: A | Discharge status: Home / Self Care | Payer: Medicare Other | Source: Ambulatory Visit | Attending: Hematology & Oncology | Admitting: Hematology & Oncology

## 2016-09-29 ENCOUNTER — Other Ambulatory Visit: Payer: Medicare Other

## 2016-09-29 ENCOUNTER — Ambulatory Visit (HOSPITAL_BASED_OUTPATIENT_CLINIC_OR_DEPARTMENT_OTHER): Payer: Medicare Other

## 2016-09-29 ENCOUNTER — Ambulatory Visit: Payer: Medicare Other

## 2016-09-29 ENCOUNTER — Ambulatory Visit (HOSPITAL_BASED_OUTPATIENT_CLINIC_OR_DEPARTMENT_OTHER): Payer: Medicare Other | Admitting: Family

## 2016-09-29 ENCOUNTER — Other Ambulatory Visit (HOSPITAL_BASED_OUTPATIENT_CLINIC_OR_DEPARTMENT_OTHER): Payer: Medicare Other

## 2016-09-29 VITALS — BP 123/52 | HR 85 | Temp 98.2°F | Resp 18 | Wt 120.0 lb

## 2016-09-29 DIAGNOSIS — C9000 Multiple myeloma not having achieved remission: Secondary | ICD-10-CM | POA: Insufficient documentation

## 2016-09-29 DIAGNOSIS — Z5112 Encounter for antineoplastic immunotherapy: Secondary | ICD-10-CM

## 2016-09-29 DIAGNOSIS — D630 Anemia in neoplastic disease: Secondary | ICD-10-CM | POA: Insufficient documentation

## 2016-09-29 DIAGNOSIS — G629 Polyneuropathy, unspecified: Secondary | ICD-10-CM

## 2016-09-29 LAB — CBC WITH DIFFERENTIAL (CANCER CENTER ONLY)
BASO#: 0 10*3/uL (ref 0.0–0.2)
BASO%: 0.9 % (ref 0.0–2.0)
EOS%: 1.8 % (ref 0.0–7.0)
Eosinophils Absolute: 0.1 10*3/uL (ref 0.0–0.5)
HEMATOCRIT: 21.4 % — AB (ref 34.8–46.6)
HEMOGLOBIN: 7 g/dL — AB (ref 11.6–15.9)
LYMPH#: 1.1 10*3/uL (ref 0.9–3.3)
LYMPH%: 32.9 % (ref 14.0–48.0)
MCH: 32.1 pg (ref 26.0–34.0)
MCHC: 32.7 g/dL (ref 32.0–36.0)
MCV: 98 fL (ref 81–101)
MONO#: 0.5 10*3/uL (ref 0.1–0.9)
MONO%: 14.5 % — ABNORMAL HIGH (ref 0.0–13.0)
NEUT#: 1.7 10*3/uL (ref 1.5–6.5)
NEUT%: 49.9 % (ref 39.6–80.0)
Platelets: 103 10*3/uL — ABNORMAL LOW (ref 145–400)
RBC: 2.18 10*6/uL — AB (ref 3.70–5.32)
RDW: 15.5 % (ref 11.1–15.7)
WBC: 3.4 10*3/uL — AB (ref 3.9–10.0)

## 2016-09-29 LAB — LACTATE DEHYDROGENASE: LDH: 152 U/L (ref 125–245)

## 2016-09-29 LAB — CMP (CANCER CENTER ONLY)
ALBUMIN: 2.5 g/dL — AB (ref 3.3–5.5)
ALK PHOS: 102 U/L — AB (ref 26–84)
ALT: 22 U/L (ref 10–47)
AST: 28 U/L (ref 11–38)
BILIRUBIN TOTAL: 0.6 mg/dL (ref 0.20–1.60)
BUN, Bld: 26 mg/dL — ABNORMAL HIGH (ref 7–22)
CALCIUM: 12.8 mg/dL — AB (ref 8.0–10.3)
CO2: 28 mEq/L (ref 18–33)
Chloride: 97 mEq/L — ABNORMAL LOW (ref 98–108)
Creat: 1.6 mg/dl — ABNORMAL HIGH (ref 0.6–1.2)
GLUCOSE: 103 mg/dL (ref 73–118)
POTASSIUM: 4.3 meq/L (ref 3.3–4.7)
Sodium: 135 mEq/L (ref 128–145)
TOTAL PROTEIN: 8.8 g/dL — AB (ref 6.4–8.1)

## 2016-09-29 MED ORDER — PROCHLORPERAZINE MALEATE 10 MG PO TABS
10.0000 mg | ORAL_TABLET | Freq: Once | ORAL | Status: AC
  Administered 2016-09-29: 10 mg via ORAL

## 2016-09-29 MED ORDER — PROCHLORPERAZINE MALEATE 10 MG PO TABS
ORAL_TABLET | ORAL | Status: AC
  Filled 2016-09-29: qty 1

## 2016-09-29 MED ORDER — BORTEZOMIB CHEMO SQ INJECTION 3.5 MG (2.5MG/ML)
1.3000 mg/m2 | Freq: Once | INTRAMUSCULAR | Status: AC
  Administered 2016-09-29: 2 mg via SUBCUTANEOUS
  Filled 2016-09-29: qty 0.8

## 2016-09-29 NOTE — Progress Notes (Signed)
Hematology and Oncology Follow Up Visit  Deanna Holloway [id number] May 21, 1945 71 y.o. 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 She still has not started Revlimid and is waiting on financial assistance. They will meet with Baxter Flattery today to hopefully finalize this.  Hgb is 7.0 with an MCV of 98. She states that she is concerned that she will drop over the weekend and become symptomatic.  Calcium is 12.8 and total protein 8.8.  No fever, chills, n/v, cough, rash, dizziness, SOB, chest pain, palpitations, abdominal pain or changes in bowel or bladder habits.  The neuropathy in her hands and feet is unchanged. No swelling in her extremities at this time. No c/o pain.  She has maintained a good appetite and continues to eat healthy. She is staying hydrated at home but admits that she could be drinking more water. Her weight is stable.   She is getting out in the mornings and evenings to walk. She really does not like to sit still.   ECOG Performance Status: 1 - Symptomatic but completely ambulatory  Medications:  Allergies as of 09/29/2016      Reactions   Adhesive [tape]    Lasix [furosemide]    Nickel       Medication List       Accurate as of 09/29/16 11:15 AM. Always use your most recent med list.          acetaminophen 500 MG tablet Commonly known as:  TYLENOL Take 500 mg by mouth every 6 (six) hours as needed.   calcium-vitamin D 500-200 MG-UNIT tablet Commonly known as:  OSCAL WITH D Take 1 tablet by mouth 2 (two) times daily.   lenalidomide 15 MG capsule Commonly known as:  REVLIMID Take 1 pill daily with food for 21 days then off 7 days.   prochlorperazine 10 MG  tablet Commonly known as:  COMPAZINE Take 1 tablet (10 mg total) by mouth every 6 (six) hours as needed for nausea or vomiting.   sodium bicarbonate 650 MG tablet Take 650 mg by mouth 3 (three) times daily.       Allergies:  Allergies  Allergen Reactions  . Adhesive [Tape]   . Lasix [Furosemide]   . Nickel     Past Medical History, Surgical history, Social history, and Family History were reviewed and updated.  Review of Systems: All other 10 point review of systems is negative.   Physical Exam:  vitals were not taken for this visit.  Wt Readings from Last 3 Encounters:  09/22/16 122 lb (55.3 kg)  09/15/16 121 lb (54.9 kg)  06/12/16 134 lb (60.8 kg)    Ocular: Sclerae unicteric, pupils equal, round and reactive to light Ear-nose-throat: Oropharynx clear, dentition fair Lymphatic: No cervical, supraclavicular or axillary adenopathy Lungs no rales or rhonchi, good excursion bilaterally Heart regular rate and rhythm, no murmur appreciated Abd soft, nontender, positive bowel sounds, no liver or spleen tip palpated on exam, no fluid wave MSK no focal spinal tenderness, no joint edema Neuro: non-focal, well-oriented, appropriate affect Breasts: Deferred   Lab Results  Component Value Date   WBC 3.4 (L) 09/29/2016   HGB 7.0 (L) 09/29/2016   HCT 21.4 (L) 09/29/2016  MCV 98 09/29/2016   PLT 103 (L) 09/29/2016   No results found for: FERRITIN, IRON, TIBC, UIBC, IRONPCTSAT Lab Results  Component Value Date   RBC 2.18 (L) 09/29/2016   Lab Results  Component Value Date   KAPLAMBRATIO 0.00 (L) 09/15/2016   Lab Results  Component Value Date   IGGSERUM 620 (L) 09/15/2016   IGMSERUM 7 (L) 09/15/2016   Lab Results  Component Value Date   MSPIKE 3.3 (H) 09/15/2016     Chemistry      Component Value Date/Time   NA 134 09/22/2016 1108   K 4.1 09/22/2016 1108   CL 99 09/22/2016 1108   CO2 29 09/22/2016 1108   BUN 26 (H) 09/22/2016 1108   CREATININE 1.5 (H)  09/22/2016 1108      Component Value Date/Time   CALCIUM 12.8 (H) 09/22/2016 1108   ALKPHOS 83 09/22/2016 1108   AST 29 09/22/2016 1108   ALT 20 09/22/2016 1108   BILITOT 0.60 09/22/2016 1108      Impression and Plan: Deanna Holloway is a very pleasant 71 yo caucasian female with IgA lambda myeloma, trisomy 57. She has tolerated Velcade and Zometa well so far. We are still working on getting financial aid for her Revlimid so they will meet with Baxter Flattery again before they leave today.  We will proceed with Velcade today as planned per Dr. Marin Olp.  She is concerned that her Hgb will drop over the weekend and she will become symptomatic. We will plan to give her 2 units of blood tomorrow.  Calcium is 12.8 and albumin is 2.5. Hopefully, she will begin Revlimid this week.  She also requested that we cancel her Compazine prescription. She prefers to use ginger and other natural remedies to prevent n/v. I have discontinued this for her.  She has her current treatment and appointment schedule. We will plan to see her back again in 7/25 for repeat labs and recheck her myeloma counts at that time.  Greater than 50% of the 15 minute face to face appointment was spent counseling and coordinating care.  Both she and her friend know to contact our office with any questions or concerns. We can certainly see her sooner if need be.   Eliezer Bottom, NP 7/5/201811:15 AM

## 2016-09-29 NOTE — Progress Notes (Signed)
OK to treat per Judson Roch NP with today's labs

## 2016-09-29 NOTE — Patient Instructions (Signed)
[facility] Discharge Instructions for Patients Receiving Chemotherapy  Today you received the following chemotherapy agents Velcade  To help prevent nausea and vomiting after your treatment, we encourage you to take your nausea medication as prescribed by MD   If you develop nausea and vomiting that is not controlled by your nausea medication, call the clinic.   BELOW ARE SYMPTOMS THAT SHOULD BE REPORTED IMMEDIATELY:  *FEVER GREATER THAN 100.5 F  *CHILLS WITH OR WITHOUT FEVER  NAUSEA AND VOMITING THAT IS NOT CONTROLLED WITH YOUR NAUSEA MEDICATION  *UNUSUAL SHORTNESS OF BREATH  *UNUSUAL BRUISING OR BLEEDING  TENDERNESS IN MOUTH AND THROAT WITH OR WITHOUT PRESENCE OF ULCERS  *URINARY PROBLEMS  *BOWEL PROBLEMS  UNUSUAL RASH Items with * indicate a potential emergency and should be followed up as soon as possible.  Feel free to call the clinic you have any questions or concerns. The clinic phone number is [phone].  Please show the CHEMO ALERT CARD at check-in to the Emergency Department and triage nurse.   Bortezomib injection What is this medicine? BORTEZOMIB (bor TEZ oh mib) is a medicine that targets proteins in cancer cells and stops the cancer cells from growing. It is used to treat multiple myeloma and mantle-cell lymphoma. This medicine may be used for other purposes; ask your health care provider or pharmacist if you have questions. COMMON BRAND NAME(S): Velcade What should I tell my health care provider before I take this medicine? They need to know if you have any of these conditions: -diabetes -heart disease -irregular heartbeat -liver disease -on hemodialysis -low blood counts, like low white blood cells, platelets, or hemoglobin -peripheral neuropathy -taking medicine for blood pressure -an unusual or allergic reaction to bortezomib, mannitol, boron, other medicines, foods, dyes, or preservatives -pregnant or trying to get  pregnant -breast-feeding How should I use this medicine? This medicine is for injection into a vein or for injection under the skin. It is given by a health care professional in a hospital or clinic setting. Talk to your pediatrician regarding the use of this medicine in children. Special care may be needed. Overdosage: If you think you have taken too much of this medicine contact a poison control center or emergency room at once. NOTE: This medicine is only for you. Do not share this medicine with others. What if I miss a dose? It is important not to miss your dose. Call your doctor or health care professional if you are unable to keep an appointment. What may interact with this medicine? This medicine may interact with the following medications: -ketoconazole -rifampin -ritonavir -St. John's Wort This list may not describe all possible interactions. Give your health care provider a list of all the medicines, herbs, non-prescription drugs, or dietary supplements you use. Also tell them if you smoke, drink alcohol, or use illegal drugs. Some items may interact with your medicine. What should I watch for while using this medicine? You may get drowsy or dizzy. Do not drive, use machinery, or do anything that needs mental alertness until you know how this medicine affects you. Do not stand or sit up quickly, especially if you are an older patient. This reduces the risk of dizzy or fainting spells. In some cases, you may be given additional medicines to help with side effects. Follow all directions for their use. Call your doctor or health care professional for advice if you get a fever, chills or sore throat, or other symptoms of a cold or flu. Do   not treat yourself. This drug decreases your body's ability to fight infections. Try to avoid being around people who are sick. This medicine may increase your risk to bruise or bleed. Call your doctor or health care professional if you notice any unusual  bleeding. You may need blood work done while you are taking this medicine. In some patients, this medicine may cause a serious brain infection that may cause death. If you have any problems seeing, thinking, speaking, walking, or standing, tell your doctor right away. If you cannot reach your doctor, urgently seek other source of medical care. Check with your doctor or health care professional if you get an attack of severe diarrhea, nausea and vomiting, or if you sweat a lot. The loss of too much body fluid can make it dangerous for you to take this medicine. Do not become pregnant while taking this medicine or for at least 2 months after stopping it. Women should inform their doctor if they wish to become pregnant or think they might be pregnant. Men should not father a child while taking this medicine and for at least 2 months after stopping it. There is a potential for serious side effects to an unborn child. Talk to your health care professional or pharmacist for more information. Do not breast-feed an infant while taking this medicine or for 2 months after stopping it. This medicine may interfere with the ability to have a child. You should talk with your doctor or health care professional if you are concerned about your fertility. What side effects may I notice from receiving this medicine? Side effects that you should report to your doctor or health care professional as soon as possible: -allergic reactions like skin rash, itching or hives, swelling of the face, lips, or tongue -breathing problems -changes in hearing -changes in vision -fast, irregular heartbeat -feeling faint or lightheaded, falls -pain, tingling, numbness in the hands or feet -right upper belly pain -seizures -swelling of the ankles, feet, hands -unusual bleeding or bruising -unusually weak or tired -vomiting -yellowing of the eyes or skin Side effects that usually do not require medical attention (report to your  doctor or health care professional if they continue or are bothersome): -changes in emotions or moods -constipation -diarrhea -loss of appetite -headache -irritation at site where injected -nausea This list may not describe all possible side effects. Call your doctor for medical advice about side effects. You may report side effects to FDA at 1-800-FDA-1088. Where should I keep my medicine? This drug is given in a hospital or clinic and will not be stored at home. NOTE: This sheet is a summary. It may not cover all possible information. If you have questions about this medicine, talk to your doctor, pharmacist, or health care provider.  2018 Elsevier/Gold Standard (2016-02-11 15:53:51)

## 2016-09-30 ENCOUNTER — Encounter: Payer: Self-pay | Admitting: Hematology & Oncology

## 2016-09-30 ENCOUNTER — Ambulatory Visit (HOSPITAL_BASED_OUTPATIENT_CLINIC_OR_DEPARTMENT_OTHER): Payer: Medicare Other

## 2016-09-30 ENCOUNTER — Other Ambulatory Visit: Payer: Self-pay | Admitting: *Deleted

## 2016-09-30 DIAGNOSIS — D630 Anemia in neoplastic disease: Secondary | ICD-10-CM

## 2016-09-30 DIAGNOSIS — C9000 Multiple myeloma not having achieved remission: Secondary | ICD-10-CM | POA: Diagnosis present

## 2016-09-30 DIAGNOSIS — Z7189 Other specified counseling: Secondary | ICD-10-CM

## 2016-09-30 LAB — PREPARE RBC (CROSSMATCH)

## 2016-09-30 LAB — ERYTHROPOIETIN: Erythropoietin: 21.9 m[IU]/mL — ABNORMAL HIGH (ref 2.6–18.5)

## 2016-09-30 MED ORDER — SODIUM CHLORIDE 0.9 % IV SOLN
250.0000 mL | Freq: Once | INTRAVENOUS | Status: AC
  Administered 2016-09-30: 250 mL via INTRAVENOUS

## 2016-09-30 MED ORDER — ACETAMINOPHEN 325 MG PO TABS
650.0000 mg | ORAL_TABLET | Freq: Once | ORAL | Status: AC
  Administered 2016-09-30: 650 mg via ORAL

## 2016-09-30 MED ORDER — LENALIDOMIDE 15 MG PO CAPS: ORAL_CAPSULE | ORAL | 0 refills | Status: DC

## 2016-09-30 MED ORDER — ACETAMINOPHEN 325 MG PO TABS
ORAL_TABLET | ORAL | Status: AC
  Filled 2016-09-30: qty 2

## 2016-09-30 MED ORDER — DIPHENHYDRAMINE HCL 25 MG PO CAPS
ORAL_CAPSULE | ORAL | Status: AC
  Filled 2016-09-30: qty 1

## 2016-09-30 NOTE — Patient Instructions (Signed)

## 2016-09-30 NOTE — Progress Notes (Signed)
Pt declined benadryl

## 2016-10-01 LAB — BPAM RBC
BLOOD PRODUCT EXPIRATION DATE: 201807172359
Blood Product Expiration Date: 201807172359
ISSUE DATE / TIME: 201807060842
ISSUE DATE / TIME: 201807060842
UNIT TYPE AND RH: 6200
Unit Type and Rh: 6200

## 2016-10-01 LAB — TYPE AND SCREEN
ABO/RH(D): A POS
Antibody Screen: NEGATIVE
UNIT DIVISION: 0
UNIT DIVISION: 0

## 2016-10-01 LAB — ABO/RH: ABO/RH(D): A POS

## 2016-10-04 ENCOUNTER — Other Ambulatory Visit: Payer: Self-pay | Admitting: *Deleted

## 2016-10-04 DIAGNOSIS — C9000 Multiple myeloma not having achieved remission: Secondary | ICD-10-CM

## 2016-10-05 ENCOUNTER — Other Ambulatory Visit (HOSPITAL_BASED_OUTPATIENT_CLINIC_OR_DEPARTMENT_OTHER): Payer: Medicare Other

## 2016-10-05 ENCOUNTER — Ambulatory Visit (HOSPITAL_BASED_OUTPATIENT_CLINIC_OR_DEPARTMENT_OTHER): Payer: Medicare Other

## 2016-10-05 VITALS — BP 113/55 | HR 76 | Temp 98.2°F | Resp 16

## 2016-10-05 DIAGNOSIS — Z5112 Encounter for antineoplastic immunotherapy: Secondary | ICD-10-CM | POA: Diagnosis present

## 2016-10-05 DIAGNOSIS — C9000 Multiple myeloma not having achieved remission: Secondary | ICD-10-CM | POA: Diagnosis present

## 2016-10-05 LAB — CBC WITH DIFFERENTIAL (CANCER CENTER ONLY)
BASO#: 0 10*3/uL (ref 0.0–0.2)
BASO%: 1.8 % (ref 0.0–2.0)
EOS ABS: 0.1 10*3/uL (ref 0.0–0.5)
EOS%: 2.2 % (ref 0.0–7.0)
HEMATOCRIT: 28.8 % — AB (ref 34.8–46.6)
HGB: 9.5 g/dL — ABNORMAL LOW (ref 11.6–15.9)
LYMPH#: 1 10*3/uL (ref 0.9–3.3)
LYMPH%: 44.7 % (ref 14.0–48.0)
MCH: 31.7 pg (ref 26.0–34.0)
MCHC: 33 g/dL (ref 32.0–36.0)
MCV: 96 fL (ref 81–101)
MONO#: 0.6 10*3/uL (ref 0.1–0.9)
MONO%: 25 % — ABNORMAL HIGH (ref 0.0–13.0)
NEUT#: 0.6 10*3/uL — ABNORMAL LOW (ref 1.5–6.5)
NEUT%: 26.3 % — AB (ref 39.6–80.0)
Platelets: 98 10*3/uL — ABNORMAL LOW (ref 145–400)
RBC: 3 10*6/uL — ABNORMAL LOW (ref 3.70–5.32)
RDW: 15 % (ref 11.1–15.7)
WBC: 2.3 10*3/uL — ABNORMAL LOW (ref 3.9–10.0)

## 2016-10-05 LAB — CMP (CANCER CENTER ONLY)
ALBUMIN: 2.6 g/dL — AB (ref 3.3–5.5)
ALT(SGPT): 18 U/L (ref 10–47)
AST: 22 U/L (ref 11–38)
Alkaline Phosphatase: 128 U/L — ABNORMAL HIGH (ref 26–84)
BUN, Bld: 37 mg/dL — ABNORMAL HIGH (ref 7–22)
CALCIUM: 11.4 mg/dL — AB (ref 8.0–10.3)
CHLORIDE: 102 meq/L (ref 98–108)
CO2: 26 meq/L (ref 18–33)
Creat: 1.5 mg/dl — ABNORMAL HIGH (ref 0.6–1.2)
GLUCOSE: 93 mg/dL (ref 73–118)
POTASSIUM: 4.8 meq/L — AB (ref 3.3–4.7)
Sodium: 135 mEq/L (ref 128–145)
Total Bilirubin: 0.6 mg/dl (ref 0.20–1.60)
Total Protein: 8.3 g/dL — ABNORMAL HIGH (ref 6.4–8.1)

## 2016-10-05 MED ORDER — PROCHLORPERAZINE MALEATE 10 MG PO TABS
10.0000 mg | ORAL_TABLET | Freq: Once | ORAL | Status: AC
  Administered 2016-10-05: 10 mg via ORAL

## 2016-10-05 MED ORDER — PROCHLORPERAZINE MALEATE 10 MG PO TABS
ORAL_TABLET | ORAL | Status: AC
  Filled 2016-10-05: qty 1

## 2016-10-05 MED ORDER — BORTEZOMIB CHEMO SQ INJECTION 3.5 MG (2.5MG/ML)
1.3000 mg/m2 | Freq: Once | INTRAMUSCULAR | Status: AC
  Administered 2016-10-05: 2 mg via SUBCUTANEOUS
  Filled 2016-10-05: qty 2

## 2016-10-05 MED ORDER — ZOLEDRONIC ACID 4 MG/100ML IV SOLN
4.0000 mg | Freq: Once | INTRAVENOUS | Status: AC
  Administered 2016-10-05: 4 mg via INTRAVENOUS
  Filled 2016-10-05: qty 100

## 2016-10-05 NOTE — Patient Instructions (Signed)
Bortezomib injection What is this medicine? BORTEZOMIB (bor TEZ oh mib) is a medicine that targets proteins in cancer cells and stops the cancer cells from growing. It is used to treat multiple myeloma and mantle-cell lymphoma. This medicine may be used for other purposes; ask your health care provider or pharmacist if you have questions. COMMON BRAND NAME(S): Velcade What should I tell my health care provider before I take this medicine? They need to know if you have any of these conditions: -diabetes -heart disease -irregular heartbeat -liver disease -on hemodialysis -low blood counts, like low white blood cells, platelets, or hemoglobin -peripheral neuropathy -taking medicine for blood pressure -an unusual or allergic reaction to bortezomib, mannitol, boron, other medicines, foods, dyes, or preservatives -pregnant or trying to get pregnant -breast-feeding How should I use this medicine? This medicine is for injection into a vein or for injection under the skin. It is given by a health care professional in a hospital or clinic setting. Talk to your pediatrician regarding the use of this medicine in children. Special care may be needed. Overdosage: If you think you have taken too much of this medicine contact a poison control center or emergency room at once. NOTE: This medicine is only for you. Do not share this medicine with others. What if I miss a dose? It is important not to miss your dose. Call your doctor or health care professional if you are unable to keep an appointment. What may interact with this medicine? This medicine may interact with the following medications: -ketoconazole -rifampin -ritonavir -St. John's Wort This list may not describe all possible interactions. Give your health care provider a list of all the medicines, herbs, non-prescription drugs, or dietary supplements you use. Also tell them if you smoke, drink alcohol, or use illegal drugs. Some items may  interact with your medicine. What should I watch for while using this medicine? You may get drowsy or dizzy. Do not drive, use machinery, or do anything that needs mental alertness until you know how this medicine affects you. Do not stand or sit up quickly, especially if you are an older patient. This reduces the risk of dizzy or fainting spells. In some cases, you may be given additional medicines to help with side effects. Follow all directions for their use. Call your doctor or health care professional for advice if you get a fever, chills or sore throat, or other symptoms of a cold or flu. Do not treat yourself. This drug decreases your body's ability to fight infections. Try to avoid being around people who are sick. This medicine may increase your risk to bruise or bleed. Call your doctor or health care professional if you notice any unusual bleeding. You may need blood work done while you are taking this medicine. In some patients, this medicine may cause a serious brain infection that may cause death. If you have any problems seeing, thinking, speaking, walking, or standing, tell your doctor right away. If you cannot reach your doctor, urgently seek other source of medical care. Check with your doctor or health care professional if you get an attack of severe diarrhea, nausea and vomiting, or if you sweat a lot. The loss of too much body fluid can make it dangerous for you to take this medicine. Do not become pregnant while taking this medicine or for at least 2 months after stopping it. Women should inform their doctor if they wish to become pregnant or think they might be pregnant. Men should not  father a child while taking this medicine and for at least 2 months after stopping it. There is a potential for serious side effects to an unborn child. Talk to your health care professional or pharmacist for more information. Do not breast-feed an infant while taking this medicine or for 2 months after  stopping it. This medicine may interfere with the ability to have a child. You should talk with your doctor or health care professional if you are concerned about your fertility. What side effects may I notice from receiving this medicine? Side effects that you should report to your doctor or health care professional as soon as possible: -allergic reactions like skin rash, itching or hives, swelling of the face, lips, or tongue -breathing problems -changes in hearing -changes in vision -fast, irregular heartbeat -feeling faint or lightheaded, falls -pain, tingling, numbness in the hands or feet -right upper belly pain -seizures -swelling of the ankles, feet, hands -unusual bleeding or bruising -unusually weak or tired -vomiting -yellowing of the eyes or skin Side effects that usually do not require medical attention (report to your doctor or health care professional if they continue or are bothersome): -changes in emotions or moods -constipation -diarrhea -loss of appetite -headache -irritation at site where injected -nausea This list may not describe all possible side effects. Call your doctor for medical advice about side effects. You may report side effects to FDA at 1-800-FDA-1088. Where should I keep my medicine? This drug is given in a hospital or clinic and will not be stored at home. NOTE: This sheet is a summary. It may not cover all possible information. If you have questions about this medicine, talk to your doctor, pharmacist, or health care provider.  2018 [employer]vier/Gold Standard (2016-02-11 15:53:51) Zoledronic Acid injection (Hypercalcemia, Oncology) What is this medicine? ZOLEDRONIC ACID (ZOE le dron ik AS id) lowers the amount of calcium loss from bone. It is used to treat too much calcium in your blood from cancer. It is also used to prevent complications of cancer that has spread to the bone. This medicine may be used for other purposes; ask your health care  provider or pharmacist if you have questions. COMMON BRAND NAME(S): Zometa What should I tell my health care provider before I take this medicine? They need to know if you have any of these conditions: -aspirin-sensitive asthma -cancer, especially if you are receiving medicines used to treat cancer -dental disease or wear dentures -infection -kidney disease -receiving corticosteroids like dexamethasone or prednisone -an unusual or allergic reaction to zoledronic acid, other medicines, foods, dyes, or preservatives -pregnant or trying to get pregnant -breast-feeding How should I use this medicine? This medicine is for infusion into a vein. It is given by a health care professional in a hospital or clinic setting. Talk to your pediatrician regarding the use of this medicine in children. Special care may be needed. Overdosage: If you think you have taken too much of this medicine contact a poison control center or emergency room at once. NOTE: This medicine is only for you. Do not share this medicine with others. What if I miss a dose? It is important not to miss your dose. Call your doctor or health care professional if you are unable to keep an appointment. What may interact with this medicine? -certain antibiotics given by injection -NSAIDs, medicines for pain and inflammation, like ibuprofen or naproxen -some diuretics like bumetanide, furosemide -teriparatide -thalidomide This list may not describe all possible interactions. Give your health care provider  a list of all the medicines, herbs, non-prescription drugs, or dietary supplements you use. Also tell them if you smoke, drink alcohol, or use illegal drugs. Some items may interact with your medicine. What should I watch for while using this medicine? Visit your doctor or health care professional for regular checkups. It may be some time before you see the benefit from this medicine. Do not stop taking your medicine unless your doctor  tells you to. Your doctor may order blood tests or other tests to see how you are doing. Women should inform their doctor if they wish to become pregnant or think they might be pregnant. There is a potential for serious side effects to an unborn child. Talk to your health care professional or pharmacist for more information. You should make sure that you get enough calcium and vitamin D while you are taking this medicine. Discuss the foods you eat and the vitamins you take with your health care professional. Some people who take this medicine have severe bone, joint, and/or muscle pain. This medicine may also increase your risk for jaw problems or a broken thigh bone. Tell your doctor right away if you have severe pain in your jaw, bones, joints, or muscles. Tell your doctor if you have any pain that does not go away or that gets worse. Tell your dentist and dental surgeon that you are taking this medicine. You should not have major dental surgery while on this medicine. See your dentist to have a dental exam and fix any dental problems before starting this medicine. Take good care of your teeth while on this medicine. Make sure you see your dentist for regular follow-up appointments. What side effects may I notice from receiving this medicine? Side effects that you should report to your doctor or health care professional as soon as possible: -allergic reactions like skin rash, itching or hives, swelling of the face, lips, or tongue -anxiety, confusion, or depression -breathing problems -changes in vision -eye pain -feeling faint or lightheaded, falls -jaw pain, especially after dental work -mouth sores -muscle cramps, stiffness, or weakness -redness, blistering, peeling or loosening of the skin, including inside the mouth -trouble passing urine or change in the amount of urine Side effects that usually do not require medical attention (report to your doctor or health care professional if they  continue or are bothersome): -bone, joint, or muscle pain -constipation -diarrhea -fever -hair loss -irritation at site where injected -loss of appetite -nausea, vomiting -stomach upset -trouble sleeping -trouble swallowing -weak or tired This list may not describe all possible side effects. Call your doctor for medical advice about side effects. You may report side effects to FDA at 1-800-FDA-1088. Where should I keep my medicine? This drug is given in a hospital or clinic and will not be stored at home. NOTE: This sheet is a summary. It may not cover all possible information. If you have questions about this medicine, talk to your doctor, pharmacist, or health care provider.  2018 Elsevier/Gold Standard (2013-08-10 14:19:39)

## 2016-10-17 DIAGNOSIS — Z0000 Encounter for general adult medical examination without abnormal findings: Secondary | ICD-10-CM | POA: Diagnosis not present

## 2016-10-19 ENCOUNTER — Other Ambulatory Visit (HOSPITAL_BASED_OUTPATIENT_CLINIC_OR_DEPARTMENT_OTHER): Payer: Medicare Other

## 2016-10-19 ENCOUNTER — Ambulatory Visit (HOSPITAL_BASED_OUTPATIENT_CLINIC_OR_DEPARTMENT_OTHER): Payer: Medicare Other | Admitting: Hematology & Oncology

## 2016-10-19 ENCOUNTER — Ambulatory Visit (HOSPITAL_BASED_OUTPATIENT_CLINIC_OR_DEPARTMENT_OTHER): Payer: Medicare Other

## 2016-10-19 VITALS — BP 113/48 | HR 85 | Temp 98.4°F | Resp 20 | Wt 120.0 lb

## 2016-10-19 DIAGNOSIS — Z5112 Encounter for antineoplastic immunotherapy: Secondary | ICD-10-CM

## 2016-10-19 DIAGNOSIS — C9000 Multiple myeloma not having achieved remission: Secondary | ICD-10-CM

## 2016-10-19 DIAGNOSIS — D630 Anemia in neoplastic disease: Secondary | ICD-10-CM | POA: Diagnosis not present

## 2016-10-19 LAB — CMP (CANCER CENTER ONLY)
ALBUMIN: 2.6 g/dL — AB (ref 3.3–5.5)
ALT(SGPT): 17 U/L (ref 10–47)
AST: 26 U/L (ref 11–38)
Alkaline Phosphatase: 115 U/L — ABNORMAL HIGH (ref 26–84)
BUN, Bld: 28 mg/dL — ABNORMAL HIGH (ref 7–22)
CALCIUM: 11.4 mg/dL — AB (ref 8.0–10.3)
CHLORIDE: 98 meq/L (ref 98–108)
CO2: 26 meq/L (ref 18–33)
Creat: 1.3 mg/dl — ABNORMAL HIGH (ref 0.6–1.2)
GLUCOSE: 115 mg/dL (ref 73–118)
Potassium: 4.7 mEq/L (ref 3.3–4.7)
Sodium: 135 mEq/L (ref 128–145)
Total Bilirubin: 0.6 mg/dl (ref 0.20–1.60)
Total Protein: 8.4 g/dL — ABNORMAL HIGH (ref 6.4–8.1)

## 2016-10-19 LAB — CBC WITH DIFFERENTIAL (CANCER CENTER ONLY)
BASO#: 0 10*3/uL (ref 0.0–0.2)
BASO%: 1 % (ref 0.0–2.0)
EOS ABS: 0.1 10*3/uL (ref 0.0–0.5)
EOS%: 1.7 % (ref 0.0–7.0)
HEMATOCRIT: 27.4 % — AB (ref 34.8–46.6)
HGB: 9 g/dL — ABNORMAL LOW (ref 11.6–15.9)
LYMPH#: 1.1 10*3/uL (ref 0.9–3.3)
LYMPH%: 26.8 % (ref 14.0–48.0)
MCH: 31.8 pg (ref 26.0–34.0)
MCHC: 32.8 g/dL (ref 32.0–36.0)
MCV: 97 fL (ref 81–101)
MONO#: 0.3 10*3/uL (ref 0.1–0.9)
MONO%: 6.9 % (ref 0.0–13.0)
NEUT%: 63.6 % (ref 39.6–80.0)
NEUTROS ABS: 2.6 10*3/uL (ref 1.5–6.5)
Platelets: 130 10*3/uL — ABNORMAL LOW (ref 145–400)
RBC: 2.83 10*6/uL — ABNORMAL LOW (ref 3.70–5.32)
RDW: 14.7 % (ref 11.1–15.7)
WBC: 4.1 10*3/uL (ref 3.9–10.0)

## 2016-10-19 LAB — LACTATE DEHYDROGENASE: LDH: 152 U/L (ref 125–245)

## 2016-10-19 MED ORDER — PROCHLORPERAZINE MALEATE 10 MG PO TABS
ORAL_TABLET | ORAL | Status: AC
  Filled 2016-10-19: qty 1

## 2016-10-19 MED ORDER — PROCHLORPERAZINE MALEATE 10 MG PO TABS
10.0000 mg | ORAL_TABLET | Freq: Once | ORAL | Status: AC
  Administered 2016-10-19: 10 mg via ORAL

## 2016-10-19 MED ORDER — BORTEZOMIB CHEMO SQ INJECTION 3.5 MG (2.5MG/ML)
1.3000 mg/m2 | Freq: Once | INTRAMUSCULAR | Status: AC
  Administered 2016-10-19: 2 mg via SUBCUTANEOUS
  Filled 2016-10-19: qty 2

## 2016-10-19 NOTE — Progress Notes (Signed)
Hematology and Oncology Follow Up Visit  Deanna Holloway [id number] 1945-04-29 71 y.o. 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 She refuses to take Revlimid. She gets on the Internet. She hears all the horrible things that Revlimid can do. She says she is not heard one good thing about the limited. I told her that typically when patients are doing well, they don't put their gratitude on the Internet.  However, she just is not going to take it.  I told her, and her escort, that Velcade on its own will work but the myeloma we'll develop a resistance to it.  I recommended Cytoxan. Unfortunately, she refuses to take anything else.  I think that she is responding. We will see what her myeloma levels are today.  She is eating better. Her weight still not gone up.  She's had no nausea or vomiting. She's had no diarrhea. She's had no leg swelling.  Currently, her performance status is ECOG 2.   Medications:  Allergies as of 10/19/2016      Reactions   Adhesive [tape]    Lasix [furosemide]    Nickel       Medication List       Accurate as of 10/19/16  1:32 PM. Always use your most recent med list.          acetaminophen 500 MG tablet Commonly known as:  TYLENOL Take 500 mg by mouth every 6 (six) hours as needed.   calcium-vitamin D 500-200 MG-UNIT tablet Commonly known as:  OSCAL WITH D Take 1 tablet by mouth 2 (two) times daily.   lenalidomide 15 MG capsule Commonly known as:  REVLIMID Take 1 pill daily with food for 21 days then off 7 days. Repeat every 28 days. [id number]       Allergies:  Allergies  Allergen Reactions  . Adhesive [Tape]   . Lasix [Furosemide]     . Nickel     Past Medical History, Surgical history, Social history, and Family History were reviewed and updated.  Review of Systems: All other 10 point review of systems is negative.   Physical Exam:  weight is 120 lb (54.4 kg). Her oral temperature is 98.4 F (36.9 C). Her blood pressure is 113/48 (abnormal) and her pulse is 85. Her respiration is 20 and oxygen saturation is 100%.   Wt Readings from Last 3 Encounters:  10/19/16 120 lb (54.4 kg)  09/29/16 120 lb (54.4 kg)  09/22/16 122 lb (55.3 kg)    Head and neck exam shows no ocular or oral lesions. She has no palpable cervical or supraclavicular lymph nodes. Lungs are clear bilaterally. Cardiac exam regular rate and rhythm with no murmurs, rubs or bruits. Abdomen is soft. She has good bowel sounds. She has no fluid wave. There is no palpable liver or spleen tip. Back exam shows no tenderness over the spine, ribs or hips. Extremity shows no clubbing, cyanosis or edema. Skin exam shows no rashes, ecchymoses or petechia. Neurological exam shows no focal neurological deficits. ferred   Lab Results  Component Value Date   WBC 4.1 10/19/2016   HGB 9.0 (L) 10/19/2016   HCT 27.4 (L) 10/19/2016   MCV 97 10/19/2016  PLT 130 (L) 10/19/2016   No results found for: FERRITIN, IRON, TIBC, UIBC, IRONPCTSAT Lab Results  Component Value Date   RBC 2.83 (L) 10/19/2016   Lab Results  Component Value Date   KAPLAMBRATIO 0.00 (L) 09/15/2016   Lab Results  Component Value Date   IGGSERUM 620 (L) 09/15/2016   IGMSERUM 7 (L) 09/15/2016   Lab Results  Component Value Date   MSPIKE 3.3 (H) 09/15/2016     Chemistry      Component Value Date/Time   NA 135 10/19/2016 1214   K 4.7 10/19/2016 1214   CL 98 10/19/2016 1214   CO2 26 10/19/2016 1214   BUN 28 (H) 10/19/2016 1214   CREATININE 1.3 (H) 10/19/2016 1214      Component Value Date/Time   CALCIUM 11.4 (H) 10/19/2016 1214   ALKPHOS 115 (H) 10/19/2016 1214   AST 26 10/19/2016  1214   ALT 17 10/19/2016 1214   BILITOT 0.60 10/19/2016 1214      Impression and Plan: Deanna Holloway is a very pleasant 71 yo caucasian female with IgA lambda myeloma. She has the trisomy 11 chromosomal abnormality.  The fact that she no longer has symptomatic hypercalcemia is encouraging. The fact that she does not need to be transfused is also encouraging.  She still thinks about the difficult time that she had at [facility] and the side effects that she had with therapy there. Because of this, she is not going to do anything other than Velcade.   She and her escort both understand that her myeloma will develop resistance. It is hard to say when this will happen. However, we probably will know relatively easily when we start to see her myeloma numbers go up.  We will plan to get her back in a month now. We probably will have to check her labs weekly.   I spent about 40 minutes with she and her escort today. I did my best to try to convince her that adding a second drug to the Velcade would be so much more effective and also tolerable. Unfortunately, I just did not make much headway.   Volanda Napoleon, MD 7/25/20181:32 PM

## 2016-10-19 NOTE — Patient Instructions (Signed)

## 2016-10-20 LAB — BETA 2 MICROGLOBULIN, SERUM: BETA 2: 11.7 mg/L — AB (ref 0.6–2.4)

## 2016-10-20 LAB — KAPPA/LAMBDA LIGHT CHAINS
IG KAPPA FREE LIGHT CHAIN: 29.8 mg/L — AB (ref 3.3–19.4)
Ig Lambda Free Light Chain: 2347 mg/L — ABNORMAL HIGH (ref 5.7–26.3)
Kappa/Lambda FluidC Ratio: 0.01 — ABNORMAL LOW (ref 0.26–1.65)

## 2016-10-24 LAB — MULTIPLE MYELOMA PANEL, SERUM
ALBUMIN SERPL ELPH-MCNC: 3 g/dL (ref 2.9–4.4)
ALPHA 1: 0.3 g/dL (ref 0.0–0.4)
Albumin/Glob SerPl: 0.7 (ref 0.7–1.7)
Alpha2 Glob SerPl Elph-Mcnc: 0.6 g/dL (ref 0.4–1.0)
B-Globulin SerPl Elph-Mcnc: 3 g/dL — ABNORMAL HIGH (ref 0.7–1.3)
Gamma Glob SerPl Elph-Mcnc: 1 g/dL (ref 0.4–1.8)
Globulin, Total: 4.8 g/dL — ABNORMAL HIGH (ref 2.2–3.9)
IGM (IMMUNOGLOBIN M), SRM: 7 mg/dL — AB (ref 26–217)
IgG, Qn, Serum: 837 mg/dL (ref 700–1600)
M Protein SerPl Elph-Mcnc: 1.9 g/dL — ABNORMAL HIGH
TOTAL PROTEIN: 7.8 g/dL (ref 6.0–8.5)

## 2016-10-25 ENCOUNTER — Telehealth: Payer: Self-pay

## 2016-10-25 NOTE — Telephone Encounter (Addendum)
-----   Message from Volanda Napoleon, MD sent at 10/24/2016  5:06 PM EDT ----- Call - tell her that the light chains that her myeloma cells are making are down by 50%%!!!!  This is great!!!  [employer] left on personalized VM with above information with instructions to contact office for questions/concerns. dph

## 2016-10-26 ENCOUNTER — Other Ambulatory Visit (HOSPITAL_BASED_OUTPATIENT_CLINIC_OR_DEPARTMENT_OTHER): Payer: Medicare Other

## 2016-10-26 ENCOUNTER — Ambulatory Visit (HOSPITAL_BASED_OUTPATIENT_CLINIC_OR_DEPARTMENT_OTHER): Payer: Medicare Other

## 2016-10-26 VITALS — BP 103/48 | HR 70 | Temp 98.4°F | Resp 17

## 2016-10-26 DIAGNOSIS — C9000 Multiple myeloma not having achieved remission: Secondary | ICD-10-CM

## 2016-10-26 DIAGNOSIS — Z5112 Encounter for antineoplastic immunotherapy: Secondary | ICD-10-CM

## 2016-10-26 LAB — CMP (CANCER CENTER ONLY)
ALK PHOS: 103 U/L — AB (ref 26–84)
ALT: 25 U/L (ref 10–47)
AST: 25 U/L (ref 11–38)
Albumin: 2.7 g/dL — ABNORMAL LOW (ref 3.3–5.5)
BUN, Bld: 33 mg/dL — ABNORMAL HIGH (ref 7–22)
CALCIUM: 10.7 mg/dL — AB (ref 8.0–10.3)
CHLORIDE: 99 meq/L (ref 98–108)
CO2: 24 mEq/L (ref 18–33)
Creat: 1.5 mg/dl — ABNORMAL HIGH (ref 0.6–1.2)
Glucose, Bld: 95 mg/dL (ref 73–118)
POTASSIUM: 4 meq/L (ref 3.3–4.7)
Sodium: 136 mEq/L (ref 128–145)
Total Bilirubin: 0.6 mg/dl (ref 0.20–1.60)
Total Protein: 8.4 g/dL — ABNORMAL HIGH (ref 6.4–8.1)

## 2016-10-26 LAB — CBC WITH DIFFERENTIAL (CANCER CENTER ONLY)
BASO#: 0 10*3/uL (ref 0.0–0.2)
BASO%: 0.8 % (ref 0.0–2.0)
EOS ABS: 0 10*3/uL (ref 0.0–0.5)
EOS%: 1.5 % (ref 0.0–7.0)
HEMATOCRIT: 25.1 % — AB (ref 34.8–46.6)
HGB: 8.5 g/dL — ABNORMAL LOW (ref 11.6–15.9)
LYMPH#: 1 10*3/uL (ref 0.9–3.3)
LYMPH%: 39.4 % (ref 14.0–48.0)
MCH: 32.4 pg (ref 26.0–34.0)
MCHC: 33.9 g/dL (ref 32.0–36.0)
MCV: 96 fL (ref 81–101)
MONO#: 0.3 10*3/uL (ref 0.1–0.9)
MONO%: 10.6 % (ref 0.0–13.0)
NEUT#: 1.3 10*3/uL — ABNORMAL LOW (ref 1.5–6.5)
NEUT%: 47.7 % (ref 39.6–80.0)
PLATELETS: 131 10*3/uL — AB (ref 145–400)
RBC: 2.62 10*6/uL — ABNORMAL LOW (ref 3.70–5.32)
RDW: 14.3 % (ref 11.1–15.7)
WBC: 2.6 10*3/uL — ABNORMAL LOW (ref 3.9–10.0)

## 2016-10-26 MED ORDER — BORTEZOMIB CHEMO SQ INJECTION 3.5 MG (2.5MG/ML)
1.3000 mg/m2 | Freq: Once | INTRAMUSCULAR | Status: AC
  Administered 2016-10-26: 2 mg via SUBCUTANEOUS
  Filled 2016-10-26: qty 2

## 2016-10-26 MED ORDER — PROCHLORPERAZINE MALEATE 10 MG PO TABS
ORAL_TABLET | ORAL | Status: AC
  Filled 2016-10-26: qty 1

## 2016-10-26 MED ORDER — PROCHLORPERAZINE MALEATE 10 MG PO TABS
10.0000 mg | ORAL_TABLET | Freq: Once | ORAL | Status: AC
  Administered 2016-10-26: 10 mg via ORAL

## 2016-10-26 NOTE — Progress Notes (Signed)
OK to treat with ANC value 1.3 per Dr. Marin Olp

## 2016-10-26 NOTE — Patient Instructions (Signed)
[facility] Discharge Instructions for Patients Receiving Chemotherapy  Today you received the following chemotherapy agents Velcade. To help prevent nausea and vomiting after your treatment, we encourage you to take your nausea medication as directed.  If you develop nausea and vomiting that is not controlled by your nausea medication, call the clinic.   BELOW ARE SYMPTOMS THAT SHOULD BE REPORTED IMMEDIATELY:  *FEVER GREATER THAN 100.5 F  *CHILLS WITH OR WITHOUT FEVER  NAUSEA AND VOMITING THAT IS NOT CONTROLLED WITH YOUR NAUSEA MEDICATION  *UNUSUAL SHORTNESS OF BREATH  *UNUSUAL BRUISING OR BLEEDING  TENDERNESS IN MOUTH AND THROAT WITH OR WITHOUT PRESENCE OF ULCERS  *URINARY PROBLEMS  *BOWEL PROBLEMS  UNUSUAL RASH Items with * indicate a potential emergency and should be followed up as soon as possible.  Feel free to call the clinic you have any questions or concerns. The clinic phone number is [phone].  Please show the CHEMO ALERT CARD at check-in to the Emergency Department and triage nurse.    

## 2016-10-27 ENCOUNTER — Telehealth: Payer: Self-pay | Admitting: Family Medicine

## 2016-10-27 NOTE — Telephone Encounter (Signed)
I canceled appt, I tried to call patient back to reschedule no answer left voicemail

## 2016-10-27 NOTE — Telephone Encounter (Signed)
Patient called and left message on nurse line asking to cancel appt she has to est care with [facility] on 11/07/16. Callback# [phone]

## 2016-11-02 ENCOUNTER — Other Ambulatory Visit (HOSPITAL_BASED_OUTPATIENT_CLINIC_OR_DEPARTMENT_OTHER): Payer: Medicare Other

## 2016-11-02 ENCOUNTER — Ambulatory Visit (HOSPITAL_BASED_OUTPATIENT_CLINIC_OR_DEPARTMENT_OTHER): Payer: Medicare Other

## 2016-11-02 VITALS — BP 115/52 | Temp 97.8°F | Resp 18

## 2016-11-02 DIAGNOSIS — C9000 Multiple myeloma not having achieved remission: Secondary | ICD-10-CM

## 2016-11-02 DIAGNOSIS — Z5112 Encounter for antineoplastic immunotherapy: Secondary | ICD-10-CM

## 2016-11-02 LAB — CBC WITH DIFFERENTIAL (CANCER CENTER ONLY)
BASO#: 0 10*3/uL (ref 0.0–0.2)
BASO%: 0.4 % (ref 0.0–2.0)
EOS%: 1.8 % (ref 0.0–7.0)
Eosinophils Absolute: 0.1 10*3/uL (ref 0.0–0.5)
HEMATOCRIT: 25.4 % — AB (ref 34.8–46.6)
HGB: 8.6 g/dL — ABNORMAL LOW (ref 11.6–15.9)
LYMPH#: 1.1 10*3/uL (ref 0.9–3.3)
LYMPH%: 41.4 % (ref 14.0–48.0)
MCH: 32.7 pg (ref 26.0–34.0)
MCHC: 33.9 g/dL (ref 32.0–36.0)
MCV: 97 fL (ref 81–101)
MONO#: 0.7 10*3/uL (ref 0.1–0.9)
MONO%: 23.8 % — ABNORMAL HIGH (ref 0.0–13.0)
NEUT#: 0.9 10*3/uL — ABNORMAL LOW (ref 1.5–6.5)
NEUT%: 32.6 % — AB (ref 39.6–80.0)
Platelets: 155 10*3/uL (ref 145–400)
RBC: 2.63 10*6/uL — ABNORMAL LOW (ref 3.70–5.32)
RDW: 14.6 % (ref 11.1–15.7)
WBC: 2.7 10*3/uL — ABNORMAL LOW (ref 3.9–10.0)

## 2016-11-02 LAB — CMP (CANCER CENTER ONLY)
ALT(SGPT): 12 U/L (ref 10–47)
AST: 19 U/L (ref 11–38)
Albumin: 2.6 g/dL — ABNORMAL LOW (ref 3.3–5.5)
Alkaline Phosphatase: 118 U/L — ABNORMAL HIGH (ref 26–84)
BUN, Bld: 30 mg/dL — ABNORMAL HIGH (ref 7–22)
CALCIUM: 9.4 mg/dL (ref 8.0–10.3)
CHLORIDE: 102 meq/L (ref 98–108)
CO2: 25 meq/L (ref 18–33)
Creat: 1.3 mg/dl — ABNORMAL HIGH (ref 0.6–1.2)
GLUCOSE: 94 mg/dL (ref 73–118)
POTASSIUM: 3.8 meq/L (ref 3.3–4.7)
Sodium: 135 mEq/L (ref 128–145)
Total Bilirubin: 0.7 mg/dl (ref 0.20–1.60)
Total Protein: 8 g/dL (ref 6.4–8.1)

## 2016-11-02 MED ORDER — DENOSUMAB 120 MG/1.7ML ~~LOC~~ SOLN
SUBCUTANEOUS | Status: AC
  Filled 2016-11-02: qty 1.7

## 2016-11-02 MED ORDER — BORTEZOMIB CHEMO SQ INJECTION 3.5 MG (2.5MG/ML)
1.3000 mg/m2 | Freq: Once | INTRAMUSCULAR | Status: AC
  Administered 2016-11-02: 2 mg via SUBCUTANEOUS
  Filled 2016-11-02: qty 2

## 2016-11-02 MED ORDER — DENOSUMAB 120 MG/1.7ML ~~LOC~~ SOLN
120.0000 mg | Freq: Once | SUBCUTANEOUS | Status: AC
  Administered 2016-11-02: 120 mg via SUBCUTANEOUS

## 2016-11-02 MED ORDER — PROCHLORPERAZINE MALEATE 10 MG PO TABS
ORAL_TABLET | ORAL | Status: AC
  Filled 2016-11-02: qty 1

## 2016-11-02 MED ORDER — PROCHLORPERAZINE MALEATE 10 MG PO TABS
10.0000 mg | ORAL_TABLET | Freq: Once | ORAL | Status: AC
  Administered 2016-11-02: 10 mg via ORAL

## 2016-11-02 NOTE — Progress Notes (Signed)
Per dr ennever, okay to treat today despite labs. 

## 2016-11-02 NOTE — Patient Instructions (Signed)
Denosumab injection What is this medicine? DENOSUMAB (den oh sue mab) slows bone breakdown. Prolia is used to treat osteoporosis in women after menopause and in men. Delton See is used to treat a high calcium level due to cancer and to prevent bone fractures and other bone problems caused by multiple myeloma or cancer bone metastases. Delton See is also used to treat giant cell tumor of the bone. This medicine may be used for other purposes; ask your health care provider or pharmacist if you have questions. COMMON BRAND NAME(S): Prolia, XGEVA What should I tell my health care provider before I take this medicine? They need to know if you have any of these conditions: -dental disease -having surgery or tooth extraction -infection -kidney disease -low levels of calcium or Vitamin D in the blood -malnutrition -on hemodialysis -skin conditions or sensitivity -thyroid or parathyroid disease -an unusual reaction to denosumab, other medicines, foods, dyes, or preservatives -pregnant or trying to get pregnant -breast-feeding How should I use this medicine? This medicine is for injection under the skin. It is given by a health care professional in a hospital or clinic setting. If you are getting Prolia, a special MedGuide will be given to you by the pharmacist with each prescription and refill. Be sure to read this information carefully each time. For Prolia, talk to your pediatrician regarding the use of this medicine in children. Special care may be needed. For Delton See, talk to your pediatrician regarding the use of this medicine in children. While this drug may be prescribed for children as young as 13 years for selected conditions, precautions do apply. Overdosage: If you think you have taken too much of this medicine contact a poison control center or emergency room at once. NOTE: This medicine is only for you. Do not share this medicine with others. What if I miss a dose? It is important not to miss your  dose. Call your doctor or health care professional if you are unable to keep an appointment. What may interact with this medicine? Do not take this medicine with any of the following medications: -other medicines containing denosumab This medicine may also interact with the following medications: -medicines that lower your chance of fighting infection -steroid medicines like prednisone or cortisone This list may not describe all possible interactions. Give your health care provider a list of all the medicines, herbs, non-prescription drugs, or dietary supplements you use. Also tell them if you smoke, drink alcohol, or use illegal drugs. Some items may interact with your medicine. What should I watch for while using this medicine? Visit your doctor or health care professional for regular checks on your progress. Your doctor or health care professional may order blood tests and other tests to see how you are doing. Call your doctor or health care professional for advice if you get a fever, chills or sore throat, or other symptoms of a cold or flu. Do not treat yourself. This drug may decrease your body's ability to fight infection. Try to avoid being around people who are sick. You should make sure you get enough calcium and vitamin D while you are taking this medicine, unless your doctor tells you not to. Discuss the foods you eat and the vitamins you take with your health care professional. See your dentist regularly. Brush and floss your teeth as directed. Before you have any dental work done, tell your dentist you are receiving this medicine. Do not become pregnant while taking this medicine or for 5 months after stopping  it. Talk with your doctor or health care professional about your birth control options while taking this medicine. Women should inform their doctor if they wish to become pregnant or think they might be pregnant. There is a potential for serious side effects to an unborn child. Talk  to your health care professional or pharmacist for more information. What side effects may I notice from receiving this medicine? Side effects that you should report to your doctor or health care professional as soon as possible: -allergic reactions like skin rash, itching or hives, swelling of the face, lips, or tongue -bone pain -breathing problems -dizziness -jaw pain, especially after dental work -redness, blistering, peeling of the skin -signs and symptoms of infection like fever or chills; cough; sore throat; pain or trouble passing urine -signs of low calcium like fast heartbeat, muscle cramps or muscle pain; pain, tingling, numbness in the hands or feet; seizures -unusual bleeding or bruising -unusually weak or tired Side effects that usually do not require medical attention (report to your doctor or health care professional if they continue or are bothersome): -constipation -diarrhea -headache -joint pain -loss of appetite -muscle pain -runny nose -tiredness -upset stomach This list may not describe all possible side effects. Call your doctor for medical advice about side effects. You may report side effects to FDA at 1-800-FDA-1088. Where should I keep my medicine? This medicine is only given in a clinic, doctor's office, or other health care setting and will not be stored at home. NOTE: This sheet is a summary. It may not cover all possible information. If you have questions about this medicine, talk to your doctor, pharmacist, or health care provider.  2018 Elsevier/Gold Standard (2016-04-05 19:17:21) Bortezomib injection What is this medicine? BORTEZOMIB (bor TEZ oh mib) is a medicine that targets proteins in cancer cells and stops the cancer cells from growing. It is used to treat multiple myeloma and mantle-cell lymphoma. This medicine may be used for other purposes; ask your health care provider or pharmacist if you have questions. COMMON BRAND NAME(S): Velcade What  should I tell my health care provider before I take this medicine? They need to know if you have any of these conditions: -diabetes -heart disease -irregular heartbeat -liver disease -on hemodialysis -low blood counts, like low white blood cells, platelets, or hemoglobin -peripheral neuropathy -taking medicine for blood pressure -an unusual or allergic reaction to bortezomib, mannitol, boron, other medicines, foods, dyes, or preservatives -pregnant or trying to get pregnant -breast-feeding How should I use this medicine? This medicine is for injection into a vein or for injection under the skin. It is given by a health care professional in a hospital or clinic setting. Talk to your pediatrician regarding the use of this medicine in children. Special care may be needed. Overdosage: If you think you have taken too much of this medicine contact a poison control center or emergency room at once. NOTE: This medicine is only for you. Do not share this medicine with others. What if I miss a dose? It is important not to miss your dose. Call your doctor or health care professional if you are unable to keep an appointment. What may interact with this medicine? This medicine may interact with the following medications: -ketoconazole -rifampin -ritonavir -St. John's Wort This list may not describe all possible interactions. Give your health care provider a list of all the medicines, herbs, non-prescription drugs, or dietary supplements you use. Also tell them if you smoke, drink alcohol, or use illegal  drugs. Some items may interact with your medicine. What should I watch for while using this medicine? You may get drowsy or dizzy. Do not drive, use machinery, or do anything that needs mental alertness until you know how this medicine affects you. Do not stand or sit up quickly, especially if you are an older patient. This reduces the risk of dizzy or fainting spells. In some cases, you may be given  additional medicines to help with side effects. Follow all directions for their use. Call your doctor or health care professional for advice if you get a fever, chills or sore throat, or other symptoms of a cold or flu. Do not treat yourself. This drug decreases your body's ability to fight infections. Try to avoid being around people who are sick. This medicine may increase your risk to bruise or bleed. Call your doctor or health care professional if you notice any unusual bleeding. You may need blood work done while you are taking this medicine. In some patients, this medicine may cause a serious brain infection that may cause death. If you have any problems seeing, thinking, speaking, walking, or standing, tell your doctor right away. If you cannot reach your doctor, urgently seek other source of medical care. Check with your doctor or health care professional if you get an attack of severe diarrhea, nausea and vomiting, or if you sweat a lot. The loss of too much body fluid can make it dangerous for you to take this medicine. Do not become pregnant while taking this medicine or for at least 2 months after stopping it. Women should inform their doctor if they wish to become pregnant or think they might be pregnant. Men should not father a child while taking this medicine and for at least 2 months after stopping it. There is a potential for serious side effects to an unborn child. Talk to your health care professional or pharmacist for more information. Do not breast-feed an infant while taking this medicine or for 2 months after stopping it. This medicine may interfere with the ability to have a child. You should talk with your doctor or health care professional if you are concerned about your fertility. What side effects may I notice from receiving this medicine? Side effects that you should report to your doctor or health care professional as soon as possible: -allergic reactions like skin rash,  itching or hives, swelling of the face, lips, or tongue -breathing problems -changes in hearing -changes in vision -fast, irregular heartbeat -feeling faint or lightheaded, falls -pain, tingling, numbness in the hands or feet -right upper belly pain -seizures -swelling of the ankles, feet, hands -unusual bleeding or bruising -unusually weak or tired -vomiting -yellowing of the eyes or skin Side effects that usually do not require medical attention (report to your doctor or health care professional if they continue or are bothersome): -changes in emotions or moods -constipation -diarrhea -loss of appetite -headache -irritation at site where injected -nausea This list may not describe all possible side effects. Call your doctor for medical advice about side effects. You may report side effects to FDA at 1-800-FDA-1088. Where should I keep my medicine? This drug is given in a hospital or clinic and will not be stored at home. NOTE: This sheet is a summary. It may not cover all possible information. If you have questions about this medicine, talk to your doctor, pharmacist, or health care provider.  2018 Elsevier/Gold Standard (2016-02-11 15:53:51)  

## 2016-11-07 ENCOUNTER — Ambulatory Visit: Payer: Medicare Other | Admitting: Family Medicine

## 2016-11-16 ENCOUNTER — Ambulatory Visit: Payer: Medicare Other

## 2016-11-16 ENCOUNTER — Ambulatory Visit (HOSPITAL_BASED_OUTPATIENT_CLINIC_OR_DEPARTMENT_OTHER): Payer: Medicare Other | Admitting: Hematology & Oncology

## 2016-11-16 ENCOUNTER — Other Ambulatory Visit (HOSPITAL_BASED_OUTPATIENT_CLINIC_OR_DEPARTMENT_OTHER): Payer: Medicare Other

## 2016-11-16 VITALS — BP 128/64 | HR 79 | Temp 98.3°F | Resp 20 | Wt 121.0 lb

## 2016-11-16 DIAGNOSIS — G629 Polyneuropathy, unspecified: Secondary | ICD-10-CM

## 2016-11-16 DIAGNOSIS — C9000 Multiple myeloma not having achieved remission: Secondary | ICD-10-CM

## 2016-11-16 DIAGNOSIS — N179 Acute kidney failure, unspecified: Secondary | ICD-10-CM

## 2016-11-16 LAB — CMP (CANCER CENTER ONLY)
ALK PHOS: 96 U/L — AB (ref 26–84)
ALT: 13 U/L (ref 10–47)
AST: 20 U/L (ref 11–38)
Albumin: 2.7 g/dL — ABNORMAL LOW (ref 3.3–5.5)
BUN: 26 mg/dL — AB (ref 7–22)
CO2: 26 mEq/L (ref 18–33)
CREATININE: 1.2 mg/dL (ref 0.6–1.2)
Calcium: 8.8 mg/dL (ref 8.0–10.3)
Chloride: 104 mEq/L (ref 98–108)
Glucose, Bld: 99 mg/dL (ref 73–118)
POTASSIUM: 4.1 meq/L (ref 3.3–4.7)
Sodium: 136 mEq/L (ref 128–145)
TOTAL PROTEIN: 8.8 g/dL — AB (ref 6.4–8.1)
Total Bilirubin: 0.6 mg/dl (ref 0.20–1.60)

## 2016-11-16 LAB — CBC WITH DIFFERENTIAL (CANCER CENTER ONLY)
BASO#: 0 10*3/uL (ref 0.0–0.2)
BASO%: 0.4 % (ref 0.0–2.0)
EOS%: 0.8 % (ref 0.0–7.0)
Eosinophils Absolute: 0 10*3/uL (ref 0.0–0.5)
HCT: 25.9 % — ABNORMAL LOW (ref 34.8–46.6)
HEMOGLOBIN: 8.6 g/dL — AB (ref 11.6–15.9)
LYMPH#: 1.1 10*3/uL (ref 0.9–3.3)
LYMPH%: 23.7 % (ref 14.0–48.0)
MCH: 32.3 pg (ref 26.0–34.0)
MCHC: 33.2 g/dL (ref 32.0–36.0)
MCV: 97 fL (ref 81–101)
MONO#: 0.5 10*3/uL (ref 0.1–0.9)
MONO%: 10.6 % (ref 0.0–13.0)
NEUT%: 64.5 % (ref 39.6–80.0)
NEUTROS ABS: 3.1 10*3/uL (ref 1.5–6.5)
PLATELETS: 175 10*3/uL (ref 145–400)
RBC: 2.66 10*6/uL — AB (ref 3.70–5.32)
RDW: 14.5 % (ref 11.1–15.7)
WBC: 4.7 10*3/uL (ref 3.9–10.0)

## 2016-11-16 NOTE — Progress Notes (Signed)
Hematology and Oncology Follow Up Visit  Deanna Holloway [id number] Sep 22, 1945 72 y.o. 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  Xgeva 120 mg subcutaneous monthly    Interim History:  Deanna Holloway is back for follow-up. She said that she does not want any further therapy. I understand this. She just feels that these "chemicals" are not good for her. She complains of neuropathy. I am dubious as whether or not this neuropathy is from the Velcade. She has only had 6 Velcade doses.  She understands very well that the myeloma will worsen at some point .she says that she might take treatment in the future.  She says that when she takes the Velcade, for 3 days afterwards, she feels horrible. She wants to go to [employer] for a conference.   It is apparent that the treatments are working. Her M spike went from 3.3 g/dL down to 1.9 g/dL. Her IgA level went from 4400 mg/dL down to 3240 milligrams per deciliter. Her lambda light chain went from 500 mg/dL down to 235 mg/dL.  She is very well aware that the calcium will likely go back up again.  She says she is eating well. She had pizza last night. Her weight is stable.  She's had no bleeding. She's had no problem with bowels or bladder. She's had no nausea or vomiting.  Currently, her performance status is ECOG 2.   Medications:  Allergies as of 11/16/2016      Reactions   Adhesive [tape]    Lasix [furosemide]    Nickel       Medication List       Accurate as of 11/16/16  1:20 PM. Always use your most recent med list.          acetaminophen 500 MG tablet Commonly known as:  TYLENOL Take 500 mg by mouth every 6 (six) hours as needed.   calcium-vitamin D 500-200 MG-UNIT tablet Commonly known as:  OSCAL WITH D Take 1 tablet by mouth 2 (two)  times daily.   lenalidomide 15 MG capsule Commonly known as:  REVLIMID Take 1 pill daily with food for 21 days then off 7 days. Repeat every 28 days. [id number]       Allergies:  Allergies  Allergen Reactions  . Adhesive [Tape]   . Lasix [Furosemide]   . Nickel     Past Medical History, Surgical history, Social history, and Family History were reviewed and updated.  Review of Systems: All other 10 point review of systems is negative.   Physical Exam:  weight is 121 lb (54.9 kg). Her oral temperature is 98.3 F (36.8 C). Her blood pressure is 128/64 and her pulse is 79. Her respiration is 20 and oxygen saturation is 100%.   Wt Readings from Last 3 Encounters:  11/16/16 121 lb (54.9 kg)  10/19/16 120 lb (54.4 kg)  09/29/16 120 lb (54.4 kg)   Thin white female in no obvious distress. Head and neck exam shows no ocular or oral lesions. She has no palpable cervical or supraclavicular lymph nodes. Lungs are clear. Cardiac exam regular rate and rhythm. She has no murmurs, rubs or bruits. Abdomen is soft. Shows good bowel sounds. There is no fluid wave. There is no palpable liver or spleen tip. Back exam shows no tenderness over the spine, ribs  or hips. Extremities shows no clubbing, cyanosis or edema. Neurological exam shows no obvious neurological deficits.    Lab Results  Component Value Date   WBC 4.7 11/16/2016   HGB 8.6 (L) 11/16/2016   HCT 25.9 (L) 11/16/2016   MCV 97 11/16/2016   PLT 175 11/16/2016   No results found for: FERRITIN, IRON, TIBC, UIBC, IRONPCTSAT Lab Results  Component Value Date   RBC 2.66 (L) 11/16/2016   Lab Results  Component Value Date   KAPLAMBRATIO 0.01 (L) 10/19/2016   Lab Results  Component Value Date   IGGSERUM 837 10/19/2016   IGMSERUM 7 (L) 10/19/2016   Lab Results  Component Value Date   MSPIKE 3.3 (H) 09/15/2016     Chemistry      Component Value Date/Time   NA 136 11/16/2016 1201   K 4.1 11/16/2016 1201   CL 104 11/16/2016  1201   CO2 26 11/16/2016 1201   BUN 26 (H) 11/16/2016 1201   CREATININE 1.2 11/16/2016 1201      Component Value Date/Time   CALCIUM 8.8 11/16/2016 1201   ALKPHOS 96 (H) 11/16/2016 1201   AST 20 11/16/2016 1201   ALT 13 11/16/2016 1201   BILITOT 0.60 11/16/2016 1201      Impression and Plan: Deanna Holloway is a very pleasant 71 yo caucasian female with IgA lambda myeloma. She has the trisomy 11 chromosomal abnormality.  She refuses any further therapy. Again, she has her own opinions as to what she can tolerate. She is not concerned with the fact that this is working. She has always wanted a more "natural approach". Unfortunately, there is no "natural remedy" for the myeloma.  I hate the fact that the treatments are working so well. Again I just am dubious that she has these side effects. I know that she reads a lot and it is possible that these side effects aren't really not that bad.  At some point, the myeloma will worsen. I suspect that we will see the calcium level started going up again.  She has refused Revlimid.  I spent about 40 minutes with her and her friend. I spent all this time talking to her about her decisions and he consequences of not taking treatment. Again she understands fully what the consequences or. She just feels that she needs to have a better life.   We will plan to get her back in one month.    Volanda Napoleon, MD 8/22/20181:20 PM

## 2016-11-17 LAB — IGG, IGA, IGM: IgM, Qn, Serum: 9 mg/dL — ABNORMAL LOW (ref 26–217)

## 2016-11-18 LAB — KAPPA/LAMBDA LIGHT CHAINS
Ig Kappa Free Light Chain: 41.2 mg/L — ABNORMAL HIGH (ref 3.3–19.4)
Ig Lambda Free Light Chain: 1443.7 mg/L — ABNORMAL HIGH (ref 5.7–26.3)
KAPPA/LAMBDA FLC RATIO: 0.03 — AB (ref 0.26–1.65)

## 2016-11-22 LAB — PROTEIN ELECTROPHORESIS, SERUM, WITH REFLEX
A/G RATIO SPE: 0.7 (ref 0.7–1.7)
ALBUMIN: 3.4 g/dL (ref 2.9–4.4)
Alpha 1: 0.3 g/dL (ref 0.0–0.4)
Alpha 2: 0.6 g/dL (ref 0.4–1.0)
BETA: 2.5 g/dL — AB (ref 0.7–1.3)
GAMMA GLOBULIN: 1.5 g/dL (ref 0.4–1.8)
GLOBULIN, TOTAL: 4.8 g/dL — AB (ref 2.2–3.9)
Interpretation(See Below): 0
M-SPIKE, %: 2 g/dL — AB
TOTAL PROTEIN: 8.2 g/dL (ref 6.0–8.5)

## 2016-11-23 ENCOUNTER — Other Ambulatory Visit: Payer: Medicare Other

## 2016-11-23 ENCOUNTER — Ambulatory Visit: Payer: Medicare Other

## 2016-11-30 ENCOUNTER — Ambulatory Visit: Payer: Medicare Other

## 2016-11-30 ENCOUNTER — Other Ambulatory Visit: Payer: Medicare Other

## 2016-12-14 ENCOUNTER — Ambulatory Visit: Payer: Medicare Other | Admitting: Hematology & Oncology

## 2016-12-14 ENCOUNTER — Other Ambulatory Visit: Payer: Medicare Other

## 2016-12-14 ENCOUNTER — Ambulatory Visit: Payer: Medicare Other

## 2016-12-21 ENCOUNTER — Ambulatory Visit: Payer: Medicare Other

## 2016-12-21 ENCOUNTER — Other Ambulatory Visit (HOSPITAL_BASED_OUTPATIENT_CLINIC_OR_DEPARTMENT_OTHER): Payer: Medicare Other

## 2016-12-21 ENCOUNTER — Ambulatory Visit (HOSPITAL_BASED_OUTPATIENT_CLINIC_OR_DEPARTMENT_OTHER): Payer: Medicare Other

## 2016-12-21 ENCOUNTER — Other Ambulatory Visit: Payer: Self-pay | Admitting: *Deleted

## 2016-12-21 ENCOUNTER — Other Ambulatory Visit: Payer: Medicare Other

## 2016-12-21 DIAGNOSIS — C9000 Multiple myeloma not having achieved remission: Secondary | ICD-10-CM

## 2016-12-21 LAB — CBC WITH DIFFERENTIAL (CANCER CENTER ONLY)
BASO#: 0 10*3/uL (ref 0.0–0.2)
BASO%: 0.4 % (ref 0.0–2.0)
EOS%: 2.1 % (ref 0.0–7.0)
Eosinophils Absolute: 0.1 10*3/uL (ref 0.0–0.5)
HEMATOCRIT: 26.5 % — AB (ref 34.8–46.6)
HEMOGLOBIN: 8.8 g/dL — AB (ref 11.6–15.9)
LYMPH#: 1.2 10*3/uL (ref 0.9–3.3)
LYMPH%: 22.6 % (ref 14.0–48.0)
MCH: 32.5 pg (ref 26.0–34.0)
MCHC: 33.2 g/dL (ref 32.0–36.0)
MCV: 98 fL (ref 81–101)
MONO#: 0.6 10*3/uL (ref 0.1–0.9)
MONO%: 10.3 % (ref 0.0–13.0)
NEUT%: 64.6 % (ref 39.6–80.0)
NEUTROS ABS: 3.4 10*3/uL (ref 1.5–6.5)
Platelets: 163 10*3/uL (ref 145–400)
RBC: 2.71 10*6/uL — AB (ref 3.70–5.32)
RDW: 13.5 % (ref 11.1–15.7)
WBC: 5.3 10*3/uL (ref 3.9–10.0)

## 2016-12-21 LAB — CMP (CANCER CENTER ONLY)
ALBUMIN: 2.7 g/dL — AB (ref 3.3–5.5)
ALK PHOS: 67 U/L (ref 26–84)
ALT: 17 U/L (ref 10–47)
AST: 20 U/L (ref 11–38)
BILIRUBIN TOTAL: 0.4 mg/dL (ref 0.20–1.60)
BUN, Bld: 36 mg/dL — ABNORMAL HIGH (ref 7–22)
CALCIUM: 10 mg/dL (ref 8.0–10.3)
CO2: 25 mEq/L (ref 18–33)
Chloride: 103 mEq/L (ref 98–108)
Creat: 1.3 mg/dl — ABNORMAL HIGH (ref 0.6–1.2)
Glucose, Bld: 101 mg/dL (ref 73–118)
Potassium: 5.1 mEq/L — ABNORMAL HIGH (ref 3.3–4.7)
Sodium: 140 mEq/L (ref 128–145)
Total Protein: 9 g/dL — ABNORMAL HIGH (ref 6.4–8.1)

## 2016-12-21 MED ORDER — DENOSUMAB 120 MG/1.7ML ~~LOC~~ SOLN
SUBCUTANEOUS | Status: AC
  Filled 2016-12-21: qty 1.7

## 2016-12-21 MED ORDER — DENOSUMAB 120 MG/1.7ML ~~LOC~~ SOLN
120.0000 mg | Freq: Once | SUBCUTANEOUS | Status: AC
  Administered 2016-12-21: 120 mg via SUBCUTANEOUS

## 2016-12-21 MED ORDER — SODIUM CHLORIDE 0.9 % IV SOLN: Freq: Once | INTRAVENOUS | Status: DC

## 2016-12-21 NOTE — Patient Instructions (Signed)

## 2016-12-28 ENCOUNTER — Ambulatory Visit: Payer: Medicare Other

## 2016-12-28 ENCOUNTER — Other Ambulatory Visit: Payer: Medicare Other

## 2017-01-11 ENCOUNTER — Other Ambulatory Visit (HOSPITAL_BASED_OUTPATIENT_CLINIC_OR_DEPARTMENT_OTHER): Payer: Medicare Other

## 2017-01-11 ENCOUNTER — Ambulatory Visit (HOSPITAL_BASED_OUTPATIENT_CLINIC_OR_DEPARTMENT_OTHER): Payer: Medicare Other | Admitting: Hematology & Oncology

## 2017-01-11 ENCOUNTER — Ambulatory Visit: Payer: Medicare Other

## 2017-01-11 VITALS — BP 132/60 | HR 88 | Temp 99.0°F | Resp 18 | Wt 127.0 lb

## 2017-01-11 DIAGNOSIS — N179 Acute kidney failure, unspecified: Secondary | ICD-10-CM | POA: Diagnosis not present

## 2017-01-11 DIAGNOSIS — C9000 Multiple myeloma not having achieved remission: Secondary | ICD-10-CM | POA: Diagnosis not present

## 2017-01-11 LAB — CMP (CANCER CENTER ONLY)
ALT: 18 U/L (ref 10–47)
AST: 20 U/L (ref 11–38)
Albumin: 2.8 g/dL — ABNORMAL LOW (ref 3.3–5.5)
Alkaline Phosphatase: 62 U/L (ref 26–84)
BUN: 36 mg/dL — AB (ref 7–22)
CHLORIDE: 102 meq/L (ref 98–108)
CO2: 26 mEq/L (ref 18–33)
Calcium: 9.7 mg/dL (ref 8.0–10.3)
Creat: 1 mg/dl (ref 0.6–1.2)
GLUCOSE: 101 mg/dL (ref 73–118)
POTASSIUM: 4.2 meq/L (ref 3.3–4.7)
SODIUM: 139 meq/L (ref 128–145)
Total Bilirubin: 0.5 mg/dl (ref 0.20–1.60)
Total Protein: 8.7 g/dL — ABNORMAL HIGH (ref 6.4–8.1)

## 2017-01-11 LAB — CBC WITH DIFFERENTIAL (CANCER CENTER ONLY)
BASO#: 0 10*3/uL (ref 0.0–0.2)
BASO%: 0.4 % (ref 0.0–2.0)
EOS%: 2.5 % (ref 0.0–7.0)
Eosinophils Absolute: 0.1 10*3/uL (ref 0.0–0.5)
HCT: 26 % — ABNORMAL LOW (ref 34.8–46.6)
HGB: 8.8 g/dL — ABNORMAL LOW (ref 11.6–15.9)
LYMPH#: 1.4 10*3/uL (ref 0.9–3.3)
LYMPH%: 29 % (ref 14.0–48.0)
MCH: 32.2 pg (ref 26.0–34.0)
MCHC: 33.8 g/dL (ref 32.0–36.0)
MCV: 95 fL (ref 81–101)
MONO#: 0.5 10*3/uL (ref 0.1–0.9)
MONO%: 9.5 % (ref 0.0–13.0)
NEUT#: 2.8 10*3/uL (ref 1.5–6.5)
NEUT%: 58.6 % (ref 39.6–80.0)
PLATELETS: 165 10*3/uL (ref 145–400)
RBC: 2.73 10*6/uL — ABNORMAL LOW (ref 3.70–5.32)
RDW: 13.6 % (ref 11.1–15.7)
WBC: 4.8 10*3/uL (ref 3.9–10.0)

## 2017-01-11 NOTE — Progress Notes (Signed)
Hematology and Oncology Follow Up Visit  Deanna Holloway [id number] Oct 08, 1945 71 y.o. 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 He       completed therapy in August 2018 Xgeva 120 mg subcutaneous every 3 months -patient refuses    Interim History:  Deanna Holloway is back for follow-up. She is doing fairly well. She has a much better quality of life. She does not wish to have any treatment at all. This includes Xgeva.  We last saw her in August, her M spike was 2 g/dL. Her IgG level was 1435 milligrams per deciliter. Her lambda light chain was 144 mg/dL.  She sees be eating better. Her weight is going up a little bit. She is not complaining of any pain. She's going to the bathroom okay. There is no constipation or diarrhea.  She is more active according to her guardian.  She's had no fever. She's had no bleeding. She's had no leg swelling.  Overall, her performance status is ECOG 1.    Medications:  Allergies as of 01/11/2017      Reactions   Lasix [furosemide] Swelling   Adhesive [tape] Rash   Nickel Rash      Medication List       Accurate as of 01/11/17  3:18 PM. Always use your most recent med list.          acetaminophen 500 MG tablet Commonly known as:  TYLENOL Take 500 mg by mouth every 6 (six) hours as needed.   calcium-vitamin D 500-200 MG-UNIT tablet Commonly known as:  OSCAL WITH D Take 1 tablet by mouth 2 (two) times daily.       Allergies:  Allergies  Allergen Reactions  . Lasix [Furosemide] Swelling  . Adhesive [Tape] Rash  . Nickel Rash    Past Medical History, Surgical history, Social history, and Family History were reviewed and updated.  Review of Systems: As stated in the interim history  Physical Exam:  weight is 127 lb (57.6 kg). Her oral temperature  is 99 F (37.2 C). Her blood pressure is 132/60 and her pulse is 88. Her respiration is 18 and oxygen saturation is 100%.   Wt Readings from Last 3 Encounters:  01/11/17 127 lb (57.6 kg)  11/16/16 121 lb (54.9 kg)  10/19/16 120 lb (54.4 kg)   Thin but well-nourished white female in no obvious distress. Head and neck exam shows no ocular or oral lesions. There are no palpable cervical or supraclavicular lymph nodes. Lungs are clear bilaterally. Cardiac exam regular rate and rhythm with no murmurs, rubs or bruits. Abdomen is soft. She has good bowel sounds. There is no fluid wave. There is no palpable liver or spleen tip. Back exam shows no tenderness over the spine, ribs or hips. Extremities shows no clubbing, cyanosis or edema. She has good range of motion of her joints. She has good muscle strength in upper and lower extremities. Skin exam shows no rashes, ecchymoses or petechia. Neurological exam shows no focal neurological deficits.     Lab Results  Component Value Date   WBC 4.8 01/11/2017   HGB 8.8 (L) 01/11/2017   HCT 26.0 (L) 01/11/2017   MCV 95 01/11/2017   PLT 165 01/11/2017   No results found for: FERRITIN, IRON, TIBC, UIBC, IRONPCTSAT Lab Results  Component Value Date  RBC 2.73 (L) 01/11/2017   Lab Results  Component Value Date   KAPLAMBRATIO 0.03 (L) 11/16/2016   Lab Results  Component Value Date   IGGSERUM 1,435 11/16/2016   IGMSERUM 9 (L) 11/16/2016   Lab Results  Component Value Date   MSPIKE 2.0 (H) 11/16/2016     Chemistry      Component Value Date/Time   NA 139 01/11/2017 1418   K 4.2 01/11/2017 1418   CL 102 01/11/2017 1418   CO2 26 01/11/2017 1418   BUN 36 (H) 01/11/2017 1418   CREATININE 1.0 01/11/2017 1418      Component Value Date/Time   CALCIUM 9.7 01/11/2017 1418   ALKPHOS 62 01/11/2017 1418   AST 20 01/11/2017 1418   ALT 18 01/11/2017 1418   BILITOT 0.50 01/11/2017 1418      Impression and Plan: Deanna Holloway is a very pleasant 71 yo  caucasian female with IgA lambda myeloma. She has the trisomy 11 chromosomal abnormality.  Overall, I am quite pleased with how well she is doing. I have to believe that the myeloma numbers will be steady and may be even better.  Her quality of life is certainly doing much better. I think this is so important for her. She just has a whole new "outlook" and is much more active.  Her 71nd birthday is coming up on December 1. This actually is the same day as my daughter's birthday.  I would like to get her back in about 7 or 8 weeks. I would like to see her back before Christmas.  I spent about 30 minutes with she and her guardian. He is very dedicated to helping her.    Volanda Napoleon, MD 10/17/20183:18 PM

## 2017-01-12 LAB — KAPPA/LAMBDA LIGHT CHAINS
IG KAPPA FREE LIGHT CHAIN: 15.6 mg/L (ref 3.3–19.4)
Ig Lambda Free Light Chain: 1577.7 mg/L — ABNORMAL HIGH (ref 5.7–26.3)
KAPPA/LAMBDA FLC RATIO: 0.01 — AB (ref 0.26–1.65)

## 2017-01-12 LAB — IGG, IGA, IGM
IGM (IMMUNOGLOBIN M), SRM: 11 mg/dL — AB (ref 26–217)
IgA, Qn, Serum: 3460 mg/dL — ABNORMAL HIGH (ref 64–422)
IgG, Qn, Serum: 937 mg/dL (ref 700–1600)

## 2017-01-16 LAB — PROTEIN ELECTROPHORESIS, SERUM, WITH REFLEX
A/G RATIO SPE: 0.6 — AB (ref 0.7–1.7)
ALBUMIN: 3.1 g/dL (ref 2.9–4.4)
Alpha 1: 0.3 g/dL (ref 0.0–0.4)
Alpha 2: 0.7 g/dL (ref 0.4–1.0)
Beta: 3.1 g/dL — ABNORMAL HIGH (ref 0.7–1.3)
Gamma Globulin: 1.3 g/dL (ref 0.4–1.8)
Globulin, Total: 5.4 g/dL — ABNORMAL HIGH (ref 2.2–3.9)
INTERPRETATION(SEE BELOW): 0
M-Spike, %: 2.5 g/dL — ABNORMAL HIGH
TOTAL PROTEIN: 8.5 g/dL (ref 6.0–8.5)

## 2017-01-27 ENCOUNTER — Telehealth: Payer: Self-pay | Admitting: Hematology & Oncology

## 2017-01-27 NOTE — Telephone Encounter (Signed)
Received a fax letter from [facility] that patients [facility] has been canceled effective 01/27/2017 for Patient Non-Compliant.    [facility] [phone] 01/27/2017 8:53 AM

## 2017-03-08 ENCOUNTER — Other Ambulatory Visit: Payer: Medicare Other

## 2017-03-08 ENCOUNTER — Ambulatory Visit: Payer: Medicare Other | Admitting: Hematology & Oncology

## 2017-03-15 ENCOUNTER — Telehealth: Payer: Self-pay | Admitting: *Deleted

## 2017-03-15 ENCOUNTER — Other Ambulatory Visit (HOSPITAL_BASED_OUTPATIENT_CLINIC_OR_DEPARTMENT_OTHER): Payer: Medicare Other

## 2017-03-15 ENCOUNTER — Other Ambulatory Visit: Payer: Self-pay

## 2017-03-15 ENCOUNTER — Ambulatory Visit (HOSPITAL_BASED_OUTPATIENT_CLINIC_OR_DEPARTMENT_OTHER): Payer: Medicare Other | Admitting: Hematology & Oncology

## 2017-03-15 VITALS — BP 135/57 | HR 81 | Temp 98.4°F | Resp 20 | Wt 128.0 lb

## 2017-03-15 DIAGNOSIS — D649 Anemia, unspecified: Secondary | ICD-10-CM

## 2017-03-15 DIAGNOSIS — C9000 Multiple myeloma not having achieved remission: Secondary | ICD-10-CM

## 2017-03-15 LAB — CMP (CANCER CENTER ONLY)
ALT(SGPT): 22 U/L (ref 10–47)
AST: 27 U/L (ref 11–38)
Albumin: 3 g/dL — ABNORMAL LOW (ref 3.3–5.5)
Alkaline Phosphatase: 72 U/L (ref 26–84)
BUN, Bld: 27 mg/dL — ABNORMAL HIGH (ref 7–22)
CALCIUM: 14 mg/dL — AB (ref 8.0–10.3)
CO2: 28 meq/L (ref 18–33)
Chloride: 97 mEq/L — ABNORMAL LOW (ref 98–108)
Creat: 1.1 mg/dl (ref 0.6–1.2)
GLUCOSE: 92 mg/dL (ref 73–118)
POTASSIUM: 4.3 meq/L (ref 3.3–4.7)
Sodium: 141 mEq/L (ref 128–145)
Total Bilirubin: 0.5 mg/dl (ref 0.20–1.60)
Total Protein: 9.3 g/dL — ABNORMAL HIGH (ref 6.4–8.1)

## 2017-03-15 LAB — CBC WITH DIFFERENTIAL (CANCER CENTER ONLY)
BASO#: 0 10*3/uL (ref 0.0–0.2)
BASO%: 0.2 % (ref 0.0–2.0)
EOS%: 2.1 % (ref 0.0–7.0)
Eosinophils Absolute: 0.1 10*3/uL (ref 0.0–0.5)
HEMATOCRIT: 24.4 % — AB (ref 34.8–46.6)
HGB: 8.1 g/dL — ABNORMAL LOW (ref 11.6–15.9)
LYMPH#: 1.3 10*3/uL (ref 0.9–3.3)
LYMPH%: 27.1 % (ref 14.0–48.0)
MCH: 31.9 pg (ref 26.0–34.0)
MCHC: 33.2 g/dL (ref 32.0–36.0)
MCV: 96 fL (ref 81–101)
MONO#: 0.4 10*3/uL (ref 0.1–0.9)
MONO%: 7.3 % (ref 0.0–13.0)
NEUT#: 3 10*3/uL (ref 1.5–6.5)
NEUT%: 63.3 % (ref 39.6–80.0)
Platelets: 153 10*3/uL (ref 145–400)
RBC: 2.54 10*6/uL — ABNORMAL LOW (ref 3.70–5.32)
RDW: 14.3 % (ref 11.1–15.7)
WBC: 4.8 10*3/uL (ref 3.9–10.0)

## 2017-03-15 NOTE — Progress Notes (Signed)
Hematology and Oncology Follow Up Visit  Deanna Holloway [id number] [birth year] 71 y.o. 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 She completed therapy in August 2018 Xgeva 120 mg subcutaneous every 3 months -patient refuses    Interim History:  Deanna Holloway is back for follow-up.  She seems to be doing okay.  She says she does not feel as well.  She may feel a little bit more tired.  She said that after she last saw [place], she had some hemorrhoidal bleeding.  She says that she is sleeping pretty well.  Her caretaker who comes in with her, so that she is eating pretty well.  She did have a nice Thanksgiving.  We last saw her, her myeloma levels are going up.  Her M spike was 2.5 g/dL.  Her IgA level was 3460 mg/dL.  Her lambda light chain was 160 mg/dL.  She has had no diarrhea.  She has had no dysuria.  There is been no hematuria.  She is trying to walk.  However, with the snow that we have had, she has not been able to walk as much.  She has had no cough or shortness of breath.  Overall, her performance status is ECOG 1.    Medications:  Allergies as of 03/15/2017      Reactions   Lasix [furosemide] Swelling   Adhesive [tape] Rash   Nickel Rash      Medication List        Accurate as of 03/15/17  3:53 PM. Always use your most recent med list.          acetaminophen 500 MG tablet Commonly known as:  TYLENOL Take 500 mg by mouth every 6 (six) hours as needed.   calcium-vitamin D 500-200 MG-UNIT tablet Commonly known as:  OSCAL WITH D Take 1 tablet by mouth 2 (two) times daily.       Allergies:  Allergies  Allergen Reactions  . Lasix [Furosemide] Swelling  . Adhesive [Tape] Rash  . Nickel Rash    Past Medical History, Surgical history, Social history, and Family History were  reviewed and updated.  Review of Systems: As stated in the interim history  Physical Exam:  weight is 128 lb (58.1 kg). Her oral temperature is 98.4 F (36.9 C). Her blood pressure is 135/57 (abnormal) and her pulse is 81. Her respiration is 20 and oxygen saturation is 100%.   Wt Readings from Last 3 Encounters:  03/15/17 128 lb (58.1 kg)  01/11/17 127 lb (57.6 kg)  11/16/16 121 lb (54.9 kg)   Thin but fairly well-nourished white female.  She is alert and oriented x3.  Head neck exam shows no ocular or oral lesions.  There are no palpable cervical or supraclavicular lymph nodes.  Lungs are clear bilaterally.  Cardiac exam regular rate and rhythm with no murmurs, rubs or bruits.  Abdomen is soft.  She has decent bowel sounds.  There is no fluid wave.  There is no palpable liver or spleen tip.  Back exam shows no tenderness over the spine, ribs or hips.  Extremities shows no clubbing, cyanosis or edema.  She has decent range of motion of her joints.  Neurological exam shows no focal neurological deficits.  Skin exam shows no rashes, ecchymoses or petechia.  Lab Results  Component Value Date  WBC 4.8 03/15/2017   HGB 8.1 (L) 03/15/2017   HCT 24.4 (L) 03/15/2017   MCV 96 03/15/2017   PLT 153 03/15/2017   No results found for: FERRITIN, IRON, TIBC, UIBC, IRONPCTSAT Lab Results  Component Value Date   RBC 2.54 (L) 03/15/2017   Lab Results  Component Value Date   KAPLAMBRATIO 0.01 (L) 01/11/2017   Lab Results  Component Value Date   IGGSERUM 937 01/11/2017   IGMSERUM 11 (L) 01/11/2017   Lab Results  Component Value Date   MSPIKE 2.5 (H) 01/11/2017     Chemistry      Component Value Date/Time   NA 141 03/15/2017 1348   K 4.3 03/15/2017 1348   CL 97 (L) 03/15/2017 1348   CO2 28 03/15/2017 1348   BUN 27 (H) 03/15/2017 1348   CREATININE 1.1 03/15/2017 1348      Component Value Date/Time   CALCIUM 14.0 (HH) 03/15/2017 1348   ALKPHOS 72 03/15/2017 1348   AST 27 03/15/2017  1348   ALT 22 03/15/2017 1348   BILITOT 0.50 03/15/2017 1348      Impression and Plan: Deanna Holloway is a very pleasant 71 yo caucasian female with IgA lambda myeloma. She has the trisomy 11 chromosomal abnormality.  I am incredibly concerned today.  Her calcium is 14.  This is incredibly high.  I talked to her and her caretaker about this.  I told her that this level of calcium would be considered life-threatening.  Unfortunately, we cannot treat her in the office for this because this is getting too late.  I recommend that she be admitted to the hospital.  She absolutely refuses to go to the hospital.  She just does not trust any hospital.  I again try to convince her to go to the hospital for treatment of her hypercalcemia.  Again, she understands that without emergent treatment, that she could suffer a cardiac arrest or another lethal event.  I told her that we probably would not be able to treat her for this until Friday.  She needs IV fluids, she needs calcitonin, she needs Zometa/Xgeva.  She takes full responsibility if anything happens and she passes on.  She accepts this risk.  Her caretaker heard this.  She is of total competence to make this decision.  It is obvious that her myeloma is worsening.  She just refuses to accept this.  She understands full well that without any treatment, she will eventually succumb to the myeloma.  I suspect that she will get further anemic.  She again does not except the fact that her myeloma is causing her anemia to get worse.  She thinks this anemia is worse because of her having a hemorrhoidal bleed.  I spent about 40 minutes with her.  Again I tried my best to get her to understand the severity of her problem with the extreme hypercalcemia.  She has her mind made up and just is not going to go to the hospital. Volanda Napoleon, MD 12/19/20183:53 PM

## 2017-03-15 NOTE — Telephone Encounter (Signed)
Critical Value Calcium 14 Dr Marin Olp notified. No orders at this time

## 2017-03-16 LAB — KAPPA/LAMBDA LIGHT CHAINS
IG KAPPA FREE LIGHT CHAIN: 8.7 mg/L (ref 3.3–19.4)
Kappa/Lambda FluidC Ratio: 0 — ABNORMAL LOW (ref 0.26–1.65)

## 2017-03-16 LAB — IGG, IGA, IGM
IgA, Qn, Serum: 4027 mg/dL — ABNORMAL HIGH (ref 64–422)
IgG, Qn, Serum: 704 mg/dL (ref 700–1600)
IgM, Qn, Serum: 10 mg/dL — ABNORMAL LOW (ref 26–217)

## 2017-03-16 LAB — BETA 2 MICROGLOBULIN, SERUM: BETA 2: 8.5 mg/L — AB (ref 0.6–2.4)

## 2017-03-17 ENCOUNTER — Other Ambulatory Visit: Payer: Self-pay | Admitting: Family

## 2017-03-17 ENCOUNTER — Ambulatory Visit (HOSPITAL_BASED_OUTPATIENT_CLINIC_OR_DEPARTMENT_OTHER): Payer: Medicare Other

## 2017-03-17 VITALS — BP 124/56 | HR 77 | Temp 98.5°F | Resp 17

## 2017-03-17 DIAGNOSIS — C9000 Multiple myeloma not having achieved remission: Secondary | ICD-10-CM | POA: Diagnosis present

## 2017-03-17 MED ORDER — CALCITONIN (SALMON) 200 UNIT/ML IJ SOLN
350.0000 [IU] | Freq: Once | INTRAMUSCULAR | Status: AC
  Administered 2017-03-17: 350 [IU] via SUBCUTANEOUS
  Filled 2017-03-17: qty 1.75

## 2017-03-17 MED ORDER — ZOLEDRONIC ACID 4 MG/100ML IV SOLN
4.0000 mg | Freq: Once | INTRAVENOUS | Status: AC
  Administered 2017-03-17: 4 mg via INTRAVENOUS
  Filled 2017-03-17: qty 100

## 2017-03-17 MED ORDER — SODIUM CHLORIDE 0.9 % IV SOLN
500.0000 mL | Freq: Once | INTRAVENOUS | Status: DC
  Administered 2017-03-17: 500 mL via INTRAVENOUS

## 2017-03-17 NOTE — Patient Instructions (Addendum)
Hypercalcemia Hypercalcemia is having too much calcium in the blood. The body needs calcium to make bones and keep them strong. Calcium also helps the muscles, nerves, brain, and heart work the way they should. Most of the calcium in the body is in the bones. There is also some calcium in the blood. Hypercalcemia can happen when calcium comes out of the bones, or when the kidneys are not able to remove calcium from the blood. Hypercalcemia can be mild or severe. What are the causes? There are many possible causes of hypercalcemia. Common causes include:  Hyperparathyroidism. This is a condition in which the body produces too much parathyroid hormone. There are four parathyroid glands in your neck. These glands produce a chemical messenger (hormone) that helps the body absorb calcium from foods and helps your bones release calcium.  Certain kinds of cancer, such as lung cancer, breast cancer, or myeloma.  Less common causes of hypercalcemia include:  Getting too much calcium or vitamin D from your diet.  Kidney failure.  Hyperthyroidism.  Being on bed rest for a long time.  Certain medicines.  Infections.  Sarcoidosis.  What increases the risk? This condition is more likely to develop in:  Women.  People who are 60 years or older.  People who have a family history of hypercalcemia.  What are the signs or symptoms? Mild hypercalcemia that starts slowly may not cause symptoms. Severe, sudden hypercalcemia is more likely to cause symptoms, such as:  Loss of appetite.  Increased thirst and frequent urination.  Fatigue.  Nausea and vomiting.  Headache.  Abdominal pain.  Muscle pain, twitching, or weakness.  Constipation.  Blood in the urine.  Pain in the side of the back (flank pain).  Anxiety, confusion, or depression.  Irregular heartbeat (arrhythmia).  Loss of consciousness.  How is this diagnosed? This condition may be diagnosed based on:  Your  symptoms.  Blood tests.  Urine tests.  X-rays.  Ultrasound.  MRI.  CT scan.  How is this treated? Treatment for hypercalcemia depends on the cause. Treatment may include:  Receiving fluids through an IV tube.  Medicines that keep calcium levels steady after receiving fluids (loop diuretics).  Medicines that keep calcium in your bones (bisphosphonates).  Medicines that lower the calcium level in your blood.  Surgery to remove overactive parathyroid glands.  Follow these instructions at home:  Take over-the-counter and prescription medicines only as told by your health care provider.  Follow instructions from your health care provider about eating or drinking restrictions.  Drink enough fluid to keep your urine clear or pale yellow.  Stay active. Weight-bearing exercise helps to keep calcium in your bones. Follow instructions from your health care provider about what type and level of exercise is safe for you.  Keep all follow-up visits as told by your health care provider. This is important. Contact a health care provider if:  You have a fever.  You have flank or abdominal pain that is getting worse. Get help right away if:  You have severe abdominal or flank pain.  You have chest pain.  You have trouble breathing.  You become very confused and sleepy.  You lose consciousness. This information is not intended to replace advice given to you by your health care provider. Make sure you discuss any questions you have with your health care provider. Document Released: 05/28/2004 Document Revised: 08/20/2015 Document Reviewed: 07/30/2014 Elsevier Interactive Patient Education  2018 [employer]. Calcitonin injection What is this medicine? CALCITONIN (kal  si TOE nin) is a hormone. It helps control calcium in the body. It is used to treat Paget's disease, osteoporosis, and hypercalcemia. This medicine may be used for other purposes; ask your health care provider or  pharmacist if you have questions. COMMON BRAND NAME(S): Miacalcin What should I tell my health care provider before I take this medicine? They need to know if you have any of these conditions: -bone cancer -low level of blood calcium -an unusual or allergic reaction to calcitonin, fish, other medicines, foods, dyes, or preservatives -pregnant or trying to get pregnant -breast-feeding How should I use this medicine? This medicine is for injection under the skin or into a muscle. You will be taught how to prepare and give this medicine. Use exactly as directed. Take your medicine at regular intervals. Do not take your medicine more often than directed. It is important that you put your used needles and syringes in a special sharps container. Do not put them in a trash can. If you do not have a sharps container, call your pharmacist or healthcare provider to get one. Talk to your pediatrician regarding the use of this medicine in children. Special care may be needed. Patients over 60 years old may have a stronger reaction and need a smaller dose. Overdosage: If you think you have taken too much of this medicine contact a poison control center or emergency room at once. NOTE: This medicine is only for you. Do not share this medicine with others. What if I miss a dose? If you miss a dose, use it as soon as you can. If it is almost time for your next dose, use only that dose. Do not use double or extra doses. What may interact with this medicine? -lithium This list may not describe all possible interactions. Give your health care provider a list of all the medicines, herbs, non-prescription drugs, or dietary supplements you use. Also tell them if you smoke, drink alcohol, or use illegal drugs. Some items may interact with your medicine. What should I watch for while using this medicine? Visit your doctor or health care professional for regular checks on your progress. You will need regular blood  tests while using this medicine. Talk to your doctor about your risk of cancer. You may be more at risk for certain types of cancers if you take this medicine. You may need to be on a special diet while taking this medicine. Check with your doctor. Ask if you need to take extra calcium or vitamin D while taking this medicine. What side effects may I notice from receiving this medicine? Side effects that you should report to your doctor or health care professional as soon as possible: -allergic reactions like skin rash, itching or hives, swelling of the face, lips, or tongue -breathing problems -chest pain, tightness -dizziness -fever, chills -tingling in the hands, feet Side effects that usually do not require medical attention (report to your doctor or health care professional if they continue or are bothersome): -back or joint pain -flushing -loss of appetite -nausea, vomiting -pain, swelling where injected -stomach pain -swollen feet -tremors This list may not describe all possible side effects. Call your doctor for medical advice about side effects. You may report side effects to FDA at 1-800-FDA-1088. Where should I keep my medicine? Keep out of the reach of children. Store in a refrigerator between 2 and 8 degrees C (36 and 46 degrees F). Do not freeze. Throw away any unused medicine after the expiration  date. NOTE: This sheet is a summary. It may not cover all possible information. If you have questions about this medicine, talk to your doctor, pharmacist, or health care provider.  2018 Elsevier/Gold Standard (2012-06-07 12:10:57) Zoledronic Acid injection (Hypercalcemia, Oncology) What is this medicine? ZOLEDRONIC ACID (ZOE le dron ik AS id) lowers the amount of calcium loss from bone. It is used to treat too much calcium in your blood from cancer. It is also used to prevent complications of cancer that has spread to the bone. This medicine may be used for other purposes; ask  your health care provider or pharmacist if you have questions. COMMON BRAND NAME(S): Zometa What should I tell my health care provider before I take this medicine? They need to know if you have any of these conditions: -aspirin-sensitive asthma -cancer, especially if you are receiving medicines used to treat cancer -dental disease or wear dentures -infection -kidney disease -receiving corticosteroids like dexamethasone or prednisone -an unusual or allergic reaction to zoledronic acid, other medicines, foods, dyes, or preservatives -pregnant or trying to get pregnant -breast-feeding How should I use this medicine? This medicine is for infusion into a vein. It is given by a health care professional in a hospital or clinic setting. Talk to your pediatrician regarding the use of this medicine in children. Special care may be needed. Overdosage: If you think you have taken too much of this medicine contact a poison control center or emergency room at once. NOTE: This medicine is only for you. Do not share this medicine with others. What if I miss a dose? It is important not to miss your dose. Call your doctor or health care professional if you are unable to keep an appointment. What may interact with this medicine? -certain antibiotics given by injection -NSAIDs, medicines for pain and inflammation, like ibuprofen or naproxen -some diuretics like bumetanide, furosemide -teriparatide -thalidomide This list may not describe all possible interactions. Give your health care provider a list of all the medicines, herbs, non-prescription drugs, or dietary supplements you use. Also tell them if you smoke, drink alcohol, or use illegal drugs. Some items may interact with your medicine. What should I watch for while using this medicine? Visit your doctor or health care professional for regular checkups. It may be some time before you see the benefit from this medicine. Do not stop taking your medicine  unless your doctor tells you to. Your doctor may order blood tests or other tests to see how you are doing. Women should inform their doctor if they wish to become pregnant or think they might be pregnant. There is a potential for serious side effects to an unborn child. Talk to your health care professional or pharmacist for more information. You should make sure that you get enough calcium and vitamin D while you are taking this medicine. Discuss the foods you eat and the vitamins you take with your health care professional. Some people who take this medicine have severe bone, joint, and/or muscle pain. This medicine may also increase your risk for jaw problems or a broken thigh bone. Tell your doctor right away if you have severe pain in your jaw, bones, joints, or muscles. Tell your doctor if you have any pain that does not go away or that gets worse. Tell your dentist and dental surgeon that you are taking this medicine. You should not have major dental surgery while on this medicine. See your dentist to have a dental exam and fix any dental problems  before starting this medicine. Take good care of your teeth while on this medicine. Make sure you see your dentist for regular follow-up appointments. What side effects may I notice from receiving this medicine? Side effects that you should report to your doctor or health care professional as soon as possible: -allergic reactions like skin rash, itching or hives, swelling of the face, lips, or tongue -anxiety, confusion, or depression -breathing problems -changes in vision -eye pain -feeling faint or lightheaded, falls -jaw pain, especially after dental work -mouth sores -muscle cramps, stiffness, or weakness -redness, blistering, peeling or loosening of the skin, including inside the mouth -trouble passing urine or change in the amount of urine Side effects that usually do not require medical attention (report to your doctor or health care  professional if they continue or are bothersome): -bone, joint, or muscle pain -constipation -diarrhea -fever -hair loss -irritation at site where injected -loss of appetite -nausea, vomiting -stomach upset -trouble sleeping -trouble swallowing -weak or tired This list may not describe all possible side effects. Call your doctor for medical advice about side effects. You may report side effects to FDA at 1-800-FDA-1088. Where should I keep my medicine? This drug is given in a hospital or clinic and will not be stored at home. NOTE: This sheet is a summary. It may not cover all possible information. If you have questions about this medicine, talk to your doctor, pharmacist, or health care provider.  2018 Elsevier/Gold Standard (2013-08-10 14:19:39)

## 2017-03-20 LAB — PROTEIN ELECTROPHORESIS, SERUM, WITH REFLEX
A/G Ratio: 0.7 (ref 0.7–1.7)
ALBUMIN: 3.4 g/dL (ref 2.9–4.4)
ALPHA 1: 0.3 g/dL (ref 0.0–0.4)
Alpha 2: 0.8 g/dL (ref 0.4–1.0)
Beta: 3.5 g/dL — ABNORMAL HIGH (ref 0.7–1.3)
GAMMA GLOBULIN: 0.7 g/dL (ref 0.4–1.8)
Globulin, Total: 5.2 g/dL — ABNORMAL HIGH (ref 2.2–3.9)
Interpretation(See Below): 0
M-Spike, %: 2.8 g/dL — ABNORMAL HIGH
Total Protein: 8.6 g/dL — ABNORMAL HIGH (ref 6.0–8.5)

## 2017-03-23 ENCOUNTER — Encounter: Payer: Self-pay | Admitting: Family

## 2017-03-23 ENCOUNTER — Ambulatory Visit (HOSPITAL_BASED_OUTPATIENT_CLINIC_OR_DEPARTMENT_OTHER): Payer: Medicare Other | Admitting: Family

## 2017-03-23 ENCOUNTER — Other Ambulatory Visit: Payer: Self-pay

## 2017-03-23 ENCOUNTER — Other Ambulatory Visit (HOSPITAL_BASED_OUTPATIENT_CLINIC_OR_DEPARTMENT_OTHER): Payer: Medicare Other

## 2017-03-23 VITALS — BP 129/52 | HR 90 | Temp 98.2°F | Resp 20 | Wt 125.0 lb

## 2017-03-23 DIAGNOSIS — C9000 Multiple myeloma not having achieved remission: Secondary | ICD-10-CM

## 2017-03-23 DIAGNOSIS — R002 Palpitations: Secondary | ICD-10-CM | POA: Diagnosis not present

## 2017-03-23 DIAGNOSIS — R5383 Other fatigue: Secondary | ICD-10-CM | POA: Diagnosis not present

## 2017-03-23 LAB — CMP (CANCER CENTER ONLY)
ALBUMIN: 2.7 g/dL — AB (ref 3.3–5.5)
ALT(SGPT): 20 U/L (ref 10–47)
AST: 27 U/L (ref 11–38)
Alkaline Phosphatase: 102 U/L — ABNORMAL HIGH (ref 26–84)
BUN, Bld: 25 mg/dL — ABNORMAL HIGH (ref 7–22)
CHLORIDE: 100 meq/L (ref 98–108)
CO2: 26 meq/L (ref 18–33)
CREATININE: 1.1 mg/dL (ref 0.6–1.2)
Calcium: 12.6 mg/dL — ABNORMAL HIGH (ref 8.0–10.3)
Glucose, Bld: 98 mg/dL (ref 73–118)
Potassium: 4.1 mEq/L (ref 3.3–4.7)
SODIUM: 138 meq/L (ref 128–145)
Total Bilirubin: 0.5 mg/dl (ref 0.20–1.60)
Total Protein: 9 g/dL — ABNORMAL HIGH (ref 6.4–8.1)

## 2017-03-23 LAB — CBC WITH DIFFERENTIAL (CANCER CENTER ONLY)
BASO#: 0 10*3/uL (ref 0.0–0.2)
BASO%: 0.2 % (ref 0.0–2.0)
EOS ABS: 0.1 10*3/uL (ref 0.0–0.5)
EOS%: 2.4 % (ref 0.0–7.0)
HCT: 23.8 % — ABNORMAL LOW (ref 34.8–46.6)
HEMOGLOBIN: 8 g/dL — AB (ref 11.6–15.9)
LYMPH#: 1.3 10*3/uL (ref 0.9–3.3)
LYMPH%: 27.5 % (ref 14.0–48.0)
MCH: 31.9 pg (ref 26.0–34.0)
MCHC: 33.6 g/dL (ref 32.0–36.0)
MCV: 95 fL (ref 81–101)
MONO#: 0.5 10*3/uL (ref 0.1–0.9)
MONO%: 10.5 % (ref 0.0–13.0)
NEUT#: 2.8 10*3/uL (ref 1.5–6.5)
NEUT%: 59.4 % (ref 39.6–80.0)
PLATELETS: 141 10*3/uL — AB (ref 145–400)
RBC: 2.51 10*6/uL — ABNORMAL LOW (ref 3.70–5.32)
RDW: 14.5 % (ref 11.1–15.7)
WBC: 4.7 10*3/uL (ref 3.9–10.0)

## 2017-03-23 NOTE — Progress Notes (Signed)
Hematology and Oncology Follow Up Visit  Deanna Holloway [id number] [birth year] 71 y.o. 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 She completed therapy in August 2018 Xgeva 120 mg subcutaneous every 3 months - patient refuses    Interim History:  Deanna Holloway is here today with her friend for follow-up. She states that she is feeling a little better but still having some symptoms.  She has some fatigue and has had occasional palpitations.  Her calcium is down a little to 12.5. Hgb is 8.0. She is convinced that her diet is most important and will help correct these numbers. We discussed in detail the effect her myeloma is having on her blood production.  Her M-spike earlier this month was 2.8, IgA level 4,027 mg/dL and lambda light chain 1,842.9 mg/L. She is adhering to a healthy diet and staying hydrated. Her appetite comes and goes. She will eat ginger slices as needed to help with nausea.   She was able to get out yesterday and walk the hills around her house. She enjoyed this and the fresh are was great for her.  No fever, chills, cough, rash, dizziness, SOB, chest pain, abdominal pain or changes in bowel or bladder habits.  The occasional numbness and tingling in her fingertips is improved. No swelling or tenderness in her extremities at this tme.   ECOG Performance Status: 1 - Symptomatic but completely ambulatory  Medications:  Allergies as of 03/23/2017      Reactions   Lasix [furosemide] Swelling   Adhesive [tape] Rash   Nickel Rash      Medication List        Accurate as of 03/23/17 12:30 PM. Always use your most recent med list.          acetaminophen 500 MG tablet Commonly known as:  TYLENOL Take 500 mg by mouth every 6 (six) hours as needed.   calcium-vitamin D 500-200 MG-UNIT  tablet Commonly known as:  OSCAL WITH D Take 1 tablet by mouth 2 (two) times daily.       Allergies:  Allergies  Allergen Reactions  . Lasix [Furosemide] Swelling  . Adhesive [Tape] Rash  . Nickel Rash    Past Medical History, Surgical history, Social history, and Family History were reviewed and updated.  Review of Systems: All other 10 point review of systems is negative.   Physical Exam:  vitals were not taken for this visit.   Wt Readings from Last 3 Encounters:  03/15/17 128 lb (58.1 kg)  01/11/17 127 lb (57.6 kg)  11/16/16 121 lb (54.9 kg)    Ocular: Sclerae unicteric, pupils equal, round and reactive to light Ear-nose-throat: Oropharynx clear, dentition fair Lymphatic: No cervical, supraclavicular or axillary adenopathy Lungs no rales or rhonchi, good excursion bilaterally Heart regular rate and rhythm, no murmur appreciated Abd soft, nontender, positive bowel sounds, no liver or spleen tip palpated on exam, no fluid wave  MSK no focal spinal tenderness, no joint edema Neuro: non-focal, well-oriented, appropriate affect Breasts: Deferred   Lab Results  Component Value Date   WBC 4.7 03/23/2017   HGB 8.0 (L) 03/23/2017   HCT 23.8 (L) 03/23/2017   MCV 95 03/23/2017   PLT 141 (L) 03/23/2017   No results found for: FERRITIN, IRON, TIBC, UIBC, IRONPCTSAT Lab Results  Component Value Date  RBC 2.51 (L) 03/23/2017   Lab Results  Component Value Date   KAPLAMBRATIO 0.00 (L) 03/15/2017   Lab Results  Component Value Date   IGGSERUM 704 03/15/2017   IGMSERUM 10 (L) 03/15/2017   Lab Results  Component Value Date   MSPIKE 2.8 (H) 03/15/2017     Chemistry      Component Value Date/Time   NA 138 03/23/2017 1147   K 4.1 03/23/2017 1147   CL 100 03/23/2017 1147   CO2 26 03/23/2017 1147   BUN 25 (H) 03/23/2017 1147   CREATININE 1.1 03/23/2017 1147      Component Value Date/Time   CALCIUM 12.6 (H) 03/23/2017 1147   ALKPHOS 102 (H) 03/23/2017 1147    AST 27 03/23/2017 1147   ALT 20 03/23/2017 1147   BILITOT 0.50 03/23/2017 1147      Impression and Plan: Deanna Holloway is a pleasant 71 yo caucasian female with IgA lambda myeloma, trisomy 11 chromosomal abnormality. She still does not want to be treated. Her calcium is slightly better at 12.5.  Her myeloma studies continue to worsen.  Hgb is 8.0 and she is symptomatic with mild fatigue and occasional palpitations. We offered to giver her blood but she refused. She states that she can tell when she needs blood and will let us know when she is ready.  She continues to adhere to a strict diet and is trying to start exercising again.  She states that she did not want to coma back in a week and we settled on a 2 weeks follow-up.  She promises to contact our office with any questions or concerns. We can certainly see her sooner if needed.   Deanna Peace, NP 12/27/201812:30 PM

## 2017-03-29 ENCOUNTER — Other Ambulatory Visit: Payer: Self-pay | Admitting: Family

## 2017-03-29 DIAGNOSIS — C9000 Multiple myeloma not having achieved remission: Secondary | ICD-10-CM

## 2017-03-29 DIAGNOSIS — D508 Other iron deficiency anemias: Secondary | ICD-10-CM

## 2017-03-30 ENCOUNTER — Other Ambulatory Visit: Payer: Self-pay | Admitting: Family

## 2017-03-30 DIAGNOSIS — D508 Other iron deficiency anemias: Secondary | ICD-10-CM

## 2017-03-30 DIAGNOSIS — C9000 Multiple myeloma not having achieved remission: Secondary | ICD-10-CM

## 2017-03-30 DIAGNOSIS — D649 Anemia, unspecified: Secondary | ICD-10-CM

## 2017-03-30 DIAGNOSIS — D509 Iron deficiency anemia, unspecified: Secondary | ICD-10-CM | POA: Insufficient documentation

## 2017-03-30 NOTE — Progress Notes (Signed)
I spoke with Deanna Holloway and she is feeling quite fatigue but denies being in any distress. She consents to receiving blood and also IV iron if needed on Tuesday 04/04/17. She only likes to have 2 tubes of blood drawn at a a time so we will type and cross her that morning and check iron studies only. We will also mover her follow-up appointment to that day. She has a hard time coming in from [place] and likes to do everything in one day. Orders are in and she is in agreement with the plan.

## 2017-04-04 ENCOUNTER — Inpatient Hospital Stay: Payer: Medicare Other | Attending: Hematology & Oncology

## 2017-04-04 ENCOUNTER — Inpatient Hospital Stay (HOSPITAL_BASED_OUTPATIENT_CLINIC_OR_DEPARTMENT_OTHER): Payer: Medicare Other | Admitting: Family

## 2017-04-04 ENCOUNTER — Other Ambulatory Visit: Payer: Self-pay

## 2017-04-04 ENCOUNTER — Encounter: Payer: Self-pay | Admitting: Family

## 2017-04-04 ENCOUNTER — Inpatient Hospital Stay: Payer: Medicare Other

## 2017-04-04 ENCOUNTER — Other Ambulatory Visit: Payer: Self-pay | Admitting: Family

## 2017-04-04 ENCOUNTER — Other Ambulatory Visit: Payer: Self-pay | Admitting: *Deleted

## 2017-04-04 VITALS — BP 128/75 | HR 82 | Temp 98.0°F | Resp 16 | Wt 123.0 lb

## 2017-04-04 DIAGNOSIS — C9000 Multiple myeloma not having achieved remission: Secondary | ICD-10-CM

## 2017-04-04 DIAGNOSIS — D649 Anemia, unspecified: Secondary | ICD-10-CM

## 2017-04-04 DIAGNOSIS — D508 Other iron deficiency anemias: Secondary | ICD-10-CM

## 2017-04-04 DIAGNOSIS — D630 Anemia in neoplastic disease: Secondary | ICD-10-CM

## 2017-04-04 LAB — CBC WITH DIFFERENTIAL (CANCER CENTER ONLY)
BASOS ABS: 0 10*3/uL (ref 0.0–0.1)
BASOS PCT: 0 %
EOS PCT: 1 %
Eosinophils Absolute: 0.1 10*3/uL (ref 0.0–0.5)
HEMATOCRIT: 23.3 % — AB (ref 34.8–46.6)
HEMOGLOBIN: 8 g/dL — AB (ref 11.6–15.9)
LYMPHS ABS: 1.5 10*3/uL (ref 0.9–3.3)
LYMPHS PCT: 29 %
MCH: 32.4 pg (ref 26.0–34.0)
MCHC: 34.3 g/dL (ref 32.0–36.0)
MCV: 94.3 fL (ref 81.0–101.0)
MONOS PCT: 7 %
Monocytes Absolute: 0.4 10*3/uL (ref 0.1–0.9)
NEUTROS ABS: 3 10*3/uL (ref 1.5–6.5)
Neutrophils Relative %: 63 %
Platelet Count: 148 10*3/uL (ref 145–400)
RBC: 2.47 MIL/uL — ABNORMAL LOW (ref 3.70–5.32)
RDW: 14.2 % (ref 11.1–15.7)
WBC Count: 5 10*3/uL (ref 4.0–10.3)

## 2017-04-04 LAB — CMP (CANCER CENTER ONLY)
ALBUMIN: 2.8 g/dL — AB (ref 3.5–5.0)
ALK PHOS: 103 U/L (ref 40–150)
ALT: 11 U/L (ref 0–55)
ANION GAP: 10 (ref 3–11)
AST: 33 U/L (ref 5–34)
BUN: 29 mg/dL — ABNORMAL HIGH (ref 7–26)
CALCIUM: 15.3 mg/dL — AB (ref 8.4–10.4)
CO2: 26 mmol/L (ref 22–29)
Chloride: 95 mmol/L — ABNORMAL LOW (ref 98–109)
Creatinine: 1.33 mg/dL — ABNORMAL HIGH (ref 0.70–1.30)
GFR, EST NON AFRICAN AMERICAN: 39 mL/min — AB (ref >60)
GFR, Est AFR Am: 45 mL/min — ABNORMAL LOW (ref >60)
GLUCOSE: 103 mg/dL (ref 70–140)
POTASSIUM: 4.1 mmol/L (ref 3.3–4.7)
Sodium: 131 mmol/L — ABNORMAL LOW (ref 136–145)
Total Bilirubin: 0.3 mg/dL (ref 0.2–1.2)
Total Protein: 8.8 g/dL — ABNORMAL HIGH (ref 6.4–8.3)

## 2017-04-04 LAB — FERRITIN: FERRITIN: 1057 ng/mL — AB (ref 9–269)

## 2017-04-04 LAB — IRON AND TIBC
Iron: 78 ug/dL (ref 41–142)
SATURATION RATIOS: 41 % (ref 21–57)
TIBC: 193 ug/dL — AB (ref 236–444)
UIBC: 115 ug/dL

## 2017-04-04 LAB — PREPARE RBC (CROSSMATCH)

## 2017-04-04 LAB — ABO/RH: ABO/RH(D): A POS

## 2017-04-04 MED ORDER — SODIUM CHLORIDE 0.9 % IV SOLN
250.0000 mL | Freq: Once | INTRAVENOUS | Status: AC
  Administered 2017-04-04: 250 mL via INTRAVENOUS

## 2017-04-04 MED ORDER — ACETAMINOPHEN 325 MG PO TABS
650.0000 mg | ORAL_TABLET | Freq: Once | ORAL | Status: AC
  Administered 2017-04-04: 650 mg via ORAL

## 2017-04-04 MED ORDER — DIPHENHYDRAMINE HCL 25 MG PO CAPS: 25.0000 mg | ORAL_CAPSULE | Freq: Once | ORAL | Status: DC

## 2017-04-04 MED ORDER — DIPHENHYDRAMINE HCL 25 MG PO CAPS
ORAL_CAPSULE | ORAL | Status: AC
  Filled 2017-04-04: qty 1

## 2017-04-04 MED ORDER — ACETAMINOPHEN 325 MG PO TABS
ORAL_TABLET | ORAL | Status: AC
  Filled 2017-04-04: qty 2

## 2017-04-04 MED ORDER — ZOLEDRONIC ACID 4 MG/100ML IV SOLN
4.0000 mg | Freq: Once | INTRAVENOUS | Status: AC
  Administered 2017-04-04: 4 mg via INTRAVENOUS
  Filled 2017-04-04: qty 100

## 2017-04-04 NOTE — Progress Notes (Signed)
Hematology and Oncology Follow Up Visit  Deanna Holloway [id number] 26-Mar-1946 72 y.o. 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 She did consent to receive blood for the Hgb of 8.0 but has refused treatment with Velcade and Xgeva.  She is symptomatic with fatigue, weakness, palpitations and dizziness. She still has occasional nausea, no vomiting. M-spike in December was 2.8, IgA was 4,027 mg/dL and lambda light chain 1,842.9 mg/L.  Calcium continues to climb and is 15.3 today.  She states that she is thinking about resuming treatment. She understands that her condition will not improve with just dietary changes and exercise.  She states that her "friend" that comes with her is not agreeable to her getting treatment. He has his own business and does not have time to bring her here for treatment from [place]. We discussed transferring care to [facility] so she will be much closer to home. She states that she is agreeable to this. I will speak with Dr. Marin Olp and Burman Nieves about all this tomorrow once they return and see what arrangements can be made.  No fever, chills, cough, rash, SOB, chest pain, abdominal pain or changes in bowel or bladder habits.  She is still having constipation and will try smooth move tea and Dulcolax as needed.  No swelling or tenderness in her extremities. She has occasional numbness and tingling in her extremities.  Her appetite comes and goes. She admits that she has not been hydrating well. Her weight is down 2 lbs since her last visit.   ECOG  Performance Status: 2 - Symptomatic, <50% confined to bed  Medications:  Allergies as of 04/04/2017      Reactions   Lasix [furosemide] Swelling   Adhesive [tape] Rash   Nickel Rash      Medication List        Accurate as of 04/04/17  2:00 PM. Always use your most recent med list.          acetaminophen 500 MG tablet Commonly known as:  TYLENOL Take 500 mg by mouth every 6 (six) hours as needed.   calcium-vitamin D 500-200 MG-UNIT tablet Commonly known as:  OSCAL WITH D Take 1 tablet by mouth 2 (two) times daily.       Allergies:  Allergies  Allergen Reactions  . Lasix [Furosemide] Swelling  . Adhesive [Tape] Rash  . Nickel Rash    Past Medical History, Surgical history, Social history, and Family History were reviewed and updated.  Review of Systems: All other 10 point review of systems is negative.   Physical Exam:  vitals were not taken for this visit.   Wt Readings from Last 3 Encounters:  04/04/17 123 lb (55.8 kg)  03/23/17 125 lb (56.7 kg)  03/15/17 128 lb (58.1 kg)    Ocular: Sclerae unicteric, pupils equal, round and reactive to light Ear-nose-throat: Oropharynx clear, dentition fair Lymphatic: No cervical, supraclavicular or axillary adenopathy Lungs no rales or rhonchi, good excursion bilaterally Heart regular rate and rhythm, no murmur appreciated Abd soft, nontender, positive bowel sounds, no liver or spleen tip palpated on exam, no fluid wave  MSK no focal spinal tenderness, no joint edema Neuro: non-focal, well-oriented, appropriate affect Breasts: Deferred   Lab Results  Component Value Date   WBC 4.7 03/23/2017   HGB 8.0 (L) 03/23/2017   HCT 23.3 (L) 04/04/2017   MCV 94.3 04/04/2017   PLT 141 (L) 03/23/2017   Lab Results  Component Value Date   FERRITIN 1,057 (H) 04/04/2017   IRON 78 04/04/2017   TIBC 193 (L) 04/04/2017   UIBC 115 04/04/2017   IRONPCTSAT 41 04/04/2017   Lab Results  Component Value Date   RBC 2.47 (L)  04/04/2017   Lab Results  Component Value Date   KAPLAMBRATIO 0.00 (L) 03/15/2017   Lab Results  Component Value Date   IGGSERUM 704 03/15/2017   IGMSERUM 10 (L) 03/15/2017   Lab Results  Component Value Date   MSPIKE 2.8 (H) 03/15/2017     Chemistry      Component Value Date/Time   NA 131 (L) 04/04/2017 0830   NA 138 03/23/2017 1147   K 4.1 04/04/2017 0830   K 4.1 03/23/2017 1147   CL 95 (L) 04/04/2017 0830   CL 100 03/23/2017 1147   CO2 26 04/04/2017 0830   CO2 26 03/23/2017 1147   BUN 29 (H) 04/04/2017 0830   BUN 25 (H) 03/23/2017 1147   CREATININE 1.1 03/23/2017 1147      Component Value Date/Time   CALCIUM 15.3 (HH) 04/04/2017 0830   CALCIUM 12.6 (H) 03/23/2017 1147   ALKPHOS 103 04/04/2017 0830   ALKPHOS 102 (H) 03/23/2017 1147   AST 33 04/04/2017 0830   ALT 11 04/04/2017 0830   ALT 20 03/23/2017 1147   BILITOT 0.3 04/04/2017 0830      Impression and Plan: Deanna Holloway is a pleasant 72 yo caucasian female with IgA lambda myeloma, trisomy 11 chromosomal abnormality. She has refused treatment and her condition has continued to decline.  Calcium is now 15. She is quite symptomatic as mentioned above and now states that she will consider resuming treatment.  We gave her 1 unit of blood today followed by Zometa and fluids. She is feeling a little better at this time.  We discussed transferring her treatments to [facility] is they will accept her as this is much closer to her home. Burman Nieves is working on this and Dr. Marin Olp states that he will call and speak with her later this afternoon.  We will give her a new scheduled once arrangements are made.  Greater than 50% of her 25 minute face to face visit was spent counseling and coordinating care.  She will contact our office with any questions or concerns. We can certainy see her sooner if need be.   Laverna Peace, NP 1/8/20192:00 PM

## 2017-04-04 NOTE — Patient Instructions (Addendum)
Zoledronic Acid injection (Hypercalcemia, Oncology) What is this medicine? ZOLEDRONIC ACID (ZOE le dron ik AS id) lowers the amount of calcium loss from bone. It is used to treat too much calcium in your blood from cancer. It is also used to prevent complications of cancer that has spread to the bone. This medicine may be used for other purposes; ask your health care provider or pharmacist if you have questions. COMMON BRAND NAME(S): Zometa What should I tell my health care provider before I take this medicine? They need to know if you have any of these conditions: -aspirin-sensitive asthma -cancer, especially if you are receiving medicines used to treat cancer -dental disease or wear dentures -infection -kidney disease -receiving corticosteroids like dexamethasone or prednisone -an unusual or allergic reaction to zoledronic acid, other medicines, foods, dyes, or preservatives -pregnant or trying to get pregnant -breast-feeding How should I use this medicine? This medicine is for infusion into a vein. It is given by a health care professional in a hospital or clinic setting. Talk to your pediatrician regarding the use of this medicine in children. Special care may be needed. Overdosage: If you think you have taken too much of this medicine contact a poison control center or emergency room at once. NOTE: This medicine is only for you. Do not share this medicine with others. What if I miss a dose? It is important not to miss your dose. Call your doctor or health care professional if you are unable to keep an appointment. What may interact with this medicine? -certain antibiotics given by injection -NSAIDs, medicines for pain and inflammation, like ibuprofen or naproxen -some diuretics like bumetanide, furosemide -teriparatide -thalidomide This list may not describe all possible interactions. Give your health care provider a list of all the medicines, herbs, non-prescription drugs, or  dietary supplements you use. Also tell them if you smoke, drink alcohol, or use illegal drugs. Some items may interact with your medicine. What should I watch for while using this medicine? Visit your doctor or health care professional for regular checkups. It may be some time before you see the benefit from this medicine. Do not stop taking your medicine unless your doctor tells you to. Your doctor may order blood tests or other tests to see how you are doing. Women should inform their doctor if they wish to become pregnant or think they might be pregnant. There is a potential for serious side effects to an unborn child. Talk to your health care professional or pharmacist for more information. You should make sure that you get enough calcium and vitamin D while you are taking this medicine. Discuss the foods you eat and the vitamins you take with your health care professional. Some people who take this medicine have severe bone, joint, and/or muscle pain. This medicine may also increase your risk for jaw problems or a broken thigh bone. Tell your doctor right away if you have severe pain in your jaw, bones, joints, or muscles. Tell your doctor if you have any pain that does not go away or that gets worse. Tell your dentist and dental surgeon that you are taking this medicine. You should not have major dental surgery while on this medicine. See your dentist to have a dental exam and fix any dental problems before starting this medicine. Take good care of your teeth while on this medicine. Make sure you see your dentist for regular follow-up appointments. What side effects may I notice from receiving this medicine? Side effects that   you should report to your doctor or health care professional as soon as possible: -allergic reactions like skin rash, itching or hives, swelling of the face, lips, or tongue -anxiety, confusion, or depression -breathing problems -changes in vision -eye pain -feeling faint or  lightheaded, falls -jaw pain, especially after dental work -mouth sores -muscle cramps, stiffness, or weakness -redness, blistering, peeling or loosening of the skin, including inside the mouth -trouble passing urine or change in the amount of urine Side effects that usually do not require medical attention (report to your doctor or health care professional if they continue or are bothersome): -bone, joint, or muscle pain -constipation -diarrhea -fever -hair loss -irritation at site where injected -loss of appetite -nausea, vomiting -stomach upset -trouble sleeping -trouble swallowing -weak or tired This list may not describe all possible side effects. Call your doctor for medical advice about side effects. You may report side effects to FDA at 1-800-FDA-1088. Where should I keep my medicine? This drug is given in a hospital or clinic and will not be stored at home. NOTE: This sheet is a summary. It may not cover all possible information. If you have questions about this medicine, talk to your doctor, pharmacist, or health care provider.  2018 Elsevier/Gold Standard (2013-08-10 14:19:39)  Blood Transfusion, Adult A blood transfusion is a procedure in which you receive donated blood, including plasma, platelets, and red blood cells, through an IV tube. You may need a blood transfusion because of illness, surgery, or injury. The blood may come from a donor. You may also be able to donate blood for yourself (autologous blood donation) before a surgery if you know that you might require a blood transfusion. The blood given in a transfusion is made up of different types of cells. You may receive:  Red blood cells. These carry oxygen to the cells in the body.  White blood cells. These help you fight infections.  Platelets. These help your blood to clot.  Plasma. This is the liquid part of your blood and it helps with fluid imbalances.  If you have hemophilia or another clotting  disorder, you may also receive other types of blood products. Tell a health care provider about:  Any allergies you have.  All medicines you are taking, including vitamins, herbs, eye drops, creams, and over-the-counter medicines.  Any problems you or family members have had with anesthetic medicines.  Any blood disorders you have.  Any surgeries you have had.  Any medical conditions you have, including any recent fever or cold symptoms.  Whether you are pregnant or may be pregnant.  Any previous reactions you have had during a blood transfusion. What are the risks? Generally, this is a safe procedure. However, problems may occur, including:  Having an allergic reaction to something in the donated blood. Hives and itching may be symptoms of this type of reaction.  Fever. This may be a reaction to the white blood cells in the transfused blood. Nausea or chest pain may accompany a fever.  Iron overload. This can happen from having many transfusions.  Transfusion-related acute lung injury (TRALI). This is a rare reaction that causes lung damage. The cause is not known.TRALI can occur within hours of a transfusion or several days later.  Sudden (acute) or delayed hemolytic reactions. This happens if your blood does not match the cells in your transfusion. Your body's defense system (immune system) may try to attack the new cells. This complication is rare. The symptoms include fever, chills, nausea, and  low back pain or chest pain.  Infection or disease transmission. This is rare.  What happens before the procedure?  You will have a blood test to determine your blood type. This is necessary to know what kind of blood your body will accept and to match it to the donor blood.  If you are going to have a planned surgery, you may be able to do an autologous blood donation. This may be done in case you need to have a transfusion.  If you have had an allergic reaction to a transfusion  in the past, you may be given medicine to help prevent a reaction. This medicine may be given to you by mouth or through an IV tube.  You will have your temperature, blood pressure, and pulse monitored before the transfusion.  Follow instructions from your health care provider about eating and drinking restrictions.  Ask your health care provider about: ? Changing or stopping your regular medicines. This is especially important if you are taking diabetes medicines or blood thinners. ? Taking medicines such as aspirin and ibuprofen. These medicines can thin your blood. Do not take these medicines before your procedure if your health care provider instructs you not to. What happens during the procedure?  An IV tube will be inserted into one of your veins.  The bag of donated blood will be attached to your IV tube. The blood will then enter through your vein.  Your temperature, blood pressure, and pulse will be monitored regularly during the transfusion. This monitoring is done to detect early signs of a transfusion reaction.  If you have any signs or symptoms of a reaction, your transfusion will be stopped and you may be given medicine.  When the transfusion is complete, your IV tube will be removed.  Pressure may be applied to the IV site for a few minutes.  A bandage (dressing) will be applied. The procedure may vary among health care providers and hospitals. What happens after the procedure?  Your temperature, blood pressure, heart rate, breathing rate, and blood oxygen level will be monitored often.  Your blood may be tested to see how you are responding to the transfusion.  You may be warmed with fluids or blankets to maintain a normal body temperature. Summary  A blood transfusion is a procedure in which you receive donated blood, including plasma, platelets, and red blood cells, through an IV tube.  Your temperature, blood pressure, and pulse will be monitored before, during,  and after the transfusion.  Your blood may be tested after the transfusion to see how your body has responded. This information is not intended to replace advice given to you by your health care provider. Make sure you discuss any questions you have with your health care provider. Document Released: 03/11/2000 Document Revised: 12/10/2015 Document Reviewed: 12/10/2015 Elsevier Interactive Patient Education  [employer].

## 2017-04-05 LAB — TYPE AND SCREEN
ABO/RH(D): A POS
ANTIBODY SCREEN: NEGATIVE
UNIT DIVISION: 0

## 2017-04-05 LAB — BPAM RBC
BLOOD PRODUCT EXPIRATION DATE: 201901292359
ISSUE DATE / TIME: 201901081212
Unit Type and Rh: 6200

## 2017-04-06 ENCOUNTER — Telehealth: Payer: Self-pay | Admitting: *Deleted

## 2017-04-06 ENCOUNTER — Other Ambulatory Visit: Payer: Self-pay

## 2017-04-06 ENCOUNTER — Other Ambulatory Visit: Payer: Medicare Other

## 2017-04-06 ENCOUNTER — Encounter (HOSPITAL_COMMUNITY): Payer: Self-pay | Admitting: Emergency Medicine

## 2017-04-06 ENCOUNTER — Ambulatory Visit: Payer: Medicare Other | Admitting: Family

## 2017-04-06 ENCOUNTER — Telehealth: Payer: Self-pay

## 2017-04-06 ENCOUNTER — Emergency Department (HOSPITAL_COMMUNITY): Payer: Medicare Other

## 2017-04-06 ENCOUNTER — Emergency Department (HOSPITAL_COMMUNITY)
Admission: EM | Admit: 2017-04-06 | Discharge: 2017-04-06 | Disposition: A | Discharge status: Home / Self Care | Payer: Medicare Other | Attending: Emergency Medicine | Admitting: Emergency Medicine

## 2017-04-06 DIAGNOSIS — K529 Noninfective gastroenteritis and colitis, unspecified: Secondary | ICD-10-CM | POA: Insufficient documentation

## 2017-04-06 DIAGNOSIS — K5900 Constipation, unspecified: Secondary | ICD-10-CM

## 2017-04-06 DIAGNOSIS — K7689 Other specified diseases of liver: Secondary | ICD-10-CM | POA: Diagnosis not present

## 2017-04-06 DIAGNOSIS — Z79899 Other long term (current) drug therapy: Secondary | ICD-10-CM | POA: Insufficient documentation

## 2017-04-06 DIAGNOSIS — K644 Residual hemorrhoidal skin tags: Secondary | ICD-10-CM | POA: Diagnosis not present

## 2017-04-06 DIAGNOSIS — N281 Cyst of kidney, acquired: Secondary | ICD-10-CM | POA: Diagnosis not present

## 2017-04-06 DIAGNOSIS — Z8579 Personal history of other malignant neoplasms of lymphoid, hematopoietic and related tissues: Secondary | ICD-10-CM | POA: Diagnosis not present

## 2017-04-06 LAB — I-STAT CHEM 8, ED
BUN: 33 mg/dL — ABNORMAL HIGH (ref 6–20)
CALCIUM ION: 1.83 mmol/L — AB (ref 1.15–1.40)
Chloride: 95 mmol/L — ABNORMAL LOW (ref 101–111)
Creatinine, Ser: 1.4 mg/dL — ABNORMAL HIGH (ref 0.44–1.00)
GLUCOSE: 85 mg/dL (ref 65–99)
HEMATOCRIT: 24 % — AB (ref 36.0–46.0)
HEMOGLOBIN: 8.2 g/dL — AB (ref 12.0–15.0)
Potassium: 4.5 mmol/L (ref 3.5–5.1)
Sodium: 128 mmol/L — ABNORMAL LOW (ref 135–145)
TCO2: 28 mmol/L (ref 22–32)

## 2017-04-06 MED ORDER — CIPROFLOXACIN HCL 500 MG PO TABS: 500.0000 mg | ORAL_TABLET | Freq: Two times a day (BID) | ORAL | 0 refills | Status: DC

## 2017-04-06 MED ORDER — IOPAMIDOL (ISOVUE-300) INJECTION 61%
60.0000 mL | Freq: Once | INTRAVENOUS | Status: AC | PRN
  Administered 2017-04-06: 60 mL via INTRAVENOUS

## 2017-04-06 MED ORDER — SODIUM CHLORIDE 0.9 % IV BOLUS (SEPSIS)
1000.0000 mL | Freq: Once | INTRAVENOUS | Status: AC
  Administered 2017-04-06: 1000 mL via INTRAVENOUS

## 2017-04-06 MED ORDER — PEG 3350-KCL-NABCB-NACL-NASULF 236 G PO SOLR: ORAL | 0 refills | Status: DC

## 2017-04-06 MED ORDER — MAGNESIUM HYDROXIDE 400 MG/5ML PO SUSP
15.0000 mL | Freq: Once | ORAL | Status: AC
  Administered 2017-04-06: 15 mL via ORAL
  Filled 2017-04-06: qty 30

## 2017-04-06 MED ORDER — POLYETHYLENE GLYCOL 3350 17 G PO PACK
34.0000 g | PACK | Freq: Once | ORAL | Status: AC
  Administered 2017-04-06: 34 g via ORAL
  Filled 2017-04-06: qty 2

## 2017-04-06 NOTE — ED Notes (Signed)
Pt waiting on ride
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
## 2017-04-06 NOTE — Telephone Encounter (Signed)
Called and left message on patient's personal machine letting her know I made arrangements to transfer her care back to the [place] at [facility].  They will be calling her with an appointment to come in.

## 2017-04-06 NOTE — Telephone Encounter (Signed)
Patient called stating that she was extremely constipated and uncomfortable.  Feels a lot of pressure but no results.  Talked to Laverna Peace NP.  She suggested Lactulose.  Patient refused this.  Patient instead feels she needs to go to the ED in [place].  Patient en route now to ED

## 2017-04-06 NOTE — Telephone Encounter (Signed)
Spoke with Deanna Holloway and her caregiver and gave them appointment information.  She is to be seen at the [facility] at [facility] on Tuesday January 15 th at 10:50 am.  She will be seen there by Dr. Lebron Conners.

## 2017-04-06 NOTE — ED Notes (Signed)
Writer noted blood in tip of enema tubing. After patient had liquid sample in bedpan, liquid noted to have foul odor and red tint. Dr. Roderic Palau made aware.

## 2017-04-06 NOTE — ED Triage Notes (Signed)
Patient c/o constipation x3 days. Patient using stool softeners and Mirilax with no results. Patient states she has the urge to use the bathroom but unable to . Patient also reports "inflamed hemorrhoids".

## 2017-04-06 NOTE — ED Provider Notes (Signed)
Patient had minimal relief with the enema and also had some bleeding.  Patient has a history of hemorrhoids and could be bleeding from internal hemorrhoids.  Patient did not want to have any more enema due to the discomfort.  Patient ended up getting a CT scan of her abdomen that showed significant constipation but also colitis that could be infectious.  Patient was sent home with a prescription of GoLYTELY to help relieve her constipation and also given Cipro for possible colitis and will follow-up in 5 days with her doctor   Milton Ferguson, MD 04/06/17 1939

## 2017-04-06 NOTE — Discharge Instructions (Addendum)
Drink plenty of fluids and make sure you make your appointment on the 15th with your doctor

## 2017-04-11 ENCOUNTER — Ambulatory Visit (HOSPITAL_COMMUNITY): Payer: Medicare Other | Admitting: Hematology and Oncology

## 2017-04-11 ENCOUNTER — Telehealth: Payer: Self-pay | Admitting: *Deleted

## 2017-04-11 NOTE — Telephone Encounter (Signed)
Patient calling with several concerns. She wants to speak to Laverna Peace NP and will share with me her concerns, but wants to hear from Judson Roch for responses.  1. Her care was to be transferred to [facility]. Her appointment was this morning, but due to recent weather, she had to reschedule. Her next appointment is 1/25 and she thinks this is too far away. She wants to check her lab work before then. Explained to the patient, that we could not influence another MD office scheduling, but she could come in to this office for lab work this week. She will think about this and let Judson Roch know.   2. She is wanting to know which Calcium tablet is best for her to take.  3. She would like to know why this office initiated a transfer of care to [facility]. She states she just wanted to go there for convienence but continue to see Dr Marin Olp. Explained that for Forestine Na to treat her, she needed to be a patient of record there. If she wants to continue to see Dr Marin Olp, she can remain with this office, but treatments would need to be scheduled here with [place].   Message routed to Laverna Peace NP for her to address patient needs.

## 2017-04-12 ENCOUNTER — Other Ambulatory Visit: Payer: Self-pay | Admitting: *Deleted

## 2017-04-12 DIAGNOSIS — C9000 Multiple myeloma not having achieved remission: Secondary | ICD-10-CM

## 2017-04-13 ENCOUNTER — Inpatient Hospital Stay: Payer: Medicare Other

## 2017-04-13 DIAGNOSIS — D649 Anemia, unspecified: Secondary | ICD-10-CM | POA: Diagnosis not present

## 2017-04-13 DIAGNOSIS — C9000 Multiple myeloma not having achieved remission: Secondary | ICD-10-CM

## 2017-04-13 LAB — CBC WITH DIFFERENTIAL (CANCER CENTER ONLY)
BASOS ABS: 0 10*3/uL (ref 0.0–0.1)
BASOS PCT: 0 %
EOS PCT: 2 %
Eosinophils Absolute: 0.1 10*3/uL (ref 0.0–0.5)
HCT: 25.8 % — ABNORMAL LOW (ref 34.8–46.6)
Hemoglobin: 8.6 g/dL — ABNORMAL LOW (ref 11.6–15.9)
LYMPHS PCT: 22 %
Lymphs Abs: 1.3 10*3/uL (ref 0.9–3.3)
MCH: 31.5 pg (ref 26.0–34.0)
MCHC: 33.3 g/dL (ref 32.0–36.0)
MCV: 94.5 fL (ref 81.0–101.0)
Monocytes Absolute: 0.6 10*3/uL (ref 0.1–0.9)
Monocytes Relative: 10 %
NEUTROS ABS: 3.8 10*3/uL (ref 1.5–6.5)
Neutrophils Relative %: 66 %
PLATELETS: 156 10*3/uL (ref 145–400)
RBC: 2.73 MIL/uL — AB (ref 3.70–5.32)
RDW: 14.8 % (ref 11.1–15.7)
WBC: 5.8 10*3/uL (ref 3.9–10.3)

## 2017-04-13 LAB — CMP (CANCER CENTER ONLY)
ALBUMIN: 2.5 g/dL — AB (ref 3.5–5.0)
ALK PHOS: 89 U/L — AB (ref 26–84)
ALT: 20 U/L (ref 0–55)
AST: 32 U/L (ref 5–34)
Anion gap: 14 (ref 5–15)
BUN: 21 mg/dL (ref 7–22)
CHLORIDE: 95 mmol/L — AB (ref 98–108)
CO2: 29 mmol/L (ref 18–33)
CREATININE: 1.6 mg/dL — AB (ref 0.60–1.10)
Calcium: 13.8 mg/dL (ref 8.0–10.3)
Glucose, Bld: 120 mg/dL — ABNORMAL HIGH (ref 73–118)
POTASSIUM: 3.3 mmol/L — AB (ref 3.5–5.1)
SODIUM: 138 mmol/L (ref 128–145)
Total Bilirubin: 0.4 mg/dL (ref 0.2–1.2)
Total Protein: 8.2 g/dL — ABNORMAL HIGH (ref 6.4–8.1)

## 2017-04-13 LAB — SAMPLE TO BLOOD BANK

## 2017-04-13 MED ORDER — SODIUM CHLORIDE 0.9 % IV SOLN
INTRAVENOUS | Status: AC
  Administered 2017-04-13: 13:00:00 via INTRAVENOUS

## 2017-04-13 MED ORDER — ZOLEDRONIC ACID 4 MG/100ML IV SOLN
4.0000 mg | Freq: Once | INTRAVENOUS | Status: AC
  Administered 2017-04-13: 4 mg via INTRAVENOUS
  Filled 2017-04-13: qty 100

## 2017-04-13 NOTE — Patient Instructions (Signed)
Hypercalcemia  Hypercalcemia is having too much calcium in the blood. The body needs calcium to make bones and keep them strong. Calcium also helps the muscles, nerves, brain, and heart work the way they should.  Most of the calcium in the body is in the bones. There is also some calcium in the blood. Hypercalcemia can happen when calcium comes out of the bones, or when the kidneys are not able to remove calcium from the blood. Hypercalcemia can be mild or severe.  What are the causes?  There are many possible causes of hypercalcemia. Common causes include:   Hyperparathyroidism. This is a condition in which the body produces too much parathyroid hormone. There are four parathyroid glands in your neck. These glands produce a chemical messenger (hormone) that helps the body absorb calcium from foods and helps your bones release calcium.   Certain kinds of cancer, such as lung cancer, breast cancer, or myeloma.    Less common causes of hypercalcemia include:   Getting too much calcium or vitamin D from your diet.   Kidney failure.   Hyperthyroidism.   Being on bed rest for a long time.   Certain medicines.   Infections.   Sarcoidosis.    What increases the risk?  This condition is more likely to develop in:   Women.   People who are 60 years or older.   People who have a family history of hypercalcemia.    What are the signs or symptoms?  Mild hypercalcemia that starts slowly may not cause symptoms. Severe, sudden hypercalcemia is more likely to cause symptoms, such as:   Loss of appetite.   Increased thirst and frequent urination.   Fatigue.   Nausea and vomiting.   Headache.   Abdominal pain.   Muscle pain, twitching, or weakness.   Constipation.   Blood in the urine.   Pain in the side of the back (flank pain).   Anxiety, confusion, or depression.   Irregular heartbeat (arrhythmia).   Loss of consciousness.    How is this diagnosed?  This condition may be diagnosed based on:   Your  symptoms.   Blood tests.   Urine tests.   X-rays.   Ultrasound.   MRI.   CT scan.    How is this treated?  Treatment for hypercalcemia depends on the cause. Treatment may include:   Receiving fluids through an IV tube.   Medicines that keep calcium levels steady after receiving fluids (loop diuretics).   Medicines that keep calcium in your bones (bisphosphonates).   Medicines that lower the calcium level in your blood.   Surgery to remove overactive parathyroid glands.    Follow these instructions at home:   Take over-the-counter and prescription medicines only as told by your health care provider.   Follow instructions from your health care provider about eating or drinking restrictions.   Drink enough fluid to keep your urine clear or pale yellow.   Stay active. Weight-bearing exercise helps to keep calcium in your bones. Follow instructions from your health care provider about what type and level of exercise is safe for you.   Keep all follow-up visits as told by your health care provider. This is important.  Contact a health care provider if:   You have a fever.   You have flank or abdominal pain that is getting worse.  Get help right away if:   You have severe abdominal or flank pain.   You have chest pain.     You have trouble breathing.   You become very confused and sleepy.   You lose consciousness.  This information is not intended to replace advice given to you by your health care provider. Make sure you discuss any questions you have with your health care provider.  Document Released: 05/28/2004 Document Revised: 08/20/2015 Document Reviewed: 07/30/2014  Elsevier Interactive Patient Education  2018 [employer] Inc[employer]

## 2017-04-21 ENCOUNTER — Inpatient Hospital Stay (HOSPITAL_COMMUNITY): Payer: Medicare Other

## 2017-04-21 ENCOUNTER — Ambulatory Visit (HOSPITAL_COMMUNITY): Payer: Medicare Other

## 2017-04-21 ENCOUNTER — Encounter (HOSPITAL_COMMUNITY): Payer: Self-pay | Admitting: Internal Medicine

## 2017-04-21 ENCOUNTER — Other Ambulatory Visit: Payer: Self-pay

## 2017-04-21 ENCOUNTER — Inpatient Hospital Stay (HOSPITAL_COMMUNITY): Payer: Medicare Other | Attending: Internal Medicine | Admitting: Internal Medicine

## 2017-04-21 VITALS — BP 98/46 | HR 91 | Temp 98.3°F | Resp 18 | Ht 63.0 in | Wt 121.0 lb

## 2017-04-21 DIAGNOSIS — D649 Anemia, unspecified: Secondary | ICD-10-CM | POA: Diagnosis not present

## 2017-04-21 DIAGNOSIS — R110 Nausea: Secondary | ICD-10-CM

## 2017-04-21 DIAGNOSIS — C9000 Multiple myeloma not having achieved remission: Secondary | ICD-10-CM | POA: Insufficient documentation

## 2017-04-21 DIAGNOSIS — Z5112 Encounter for antineoplastic immunotherapy: Secondary | ICD-10-CM | POA: Insufficient documentation

## 2017-04-21 DIAGNOSIS — K5900 Constipation, unspecified: Secondary | ICD-10-CM | POA: Insufficient documentation

## 2017-04-21 LAB — CBC WITH DIFFERENTIAL/PLATELET
BASOS PCT: 1 %
Basophils Absolute: 0.1 10*3/uL (ref 0.0–0.1)
EOS PCT: 1 %
Eosinophils Absolute: 0.1 10*3/uL (ref 0.0–0.7)
HCT: 24.4 % — ABNORMAL LOW (ref 36.0–46.0)
Hemoglobin: 8.1 g/dL — ABNORMAL LOW (ref 12.0–15.0)
LYMPHS ABS: 1.3 10*3/uL (ref 0.7–4.0)
Lymphocytes Relative: 25 %
MCH: 30.7 pg (ref 26.0–34.0)
MCHC: 33.2 g/dL (ref 30.0–36.0)
MCV: 92.4 fL (ref 78.0–100.0)
MONO ABS: 0.4 10*3/uL (ref 0.1–1.0)
Monocytes Relative: 8 %
Neutro Abs: 3.4 10*3/uL (ref 1.7–7.7)
Neutrophils Relative %: 65 %
PLATELETS: 156 10*3/uL (ref 150–400)
RBC: 2.64 MIL/uL — ABNORMAL LOW (ref 3.87–5.11)
RDW: 15.2 % (ref 11.5–15.5)
WBC: 5.3 10*3/uL (ref 4.0–10.5)

## 2017-04-21 LAB — COMPREHENSIVE METABOLIC PANEL
ALK PHOS: 94 U/L (ref 38–126)
ALT: 13 U/L — AB (ref 14–54)
AST: 42 U/L — AB (ref 15–41)
Albumin: 2.9 g/dL — ABNORMAL LOW (ref 3.5–5.0)
Anion gap: 13 (ref 5–15)
BUN: 24 mg/dL — AB (ref 6–20)
CHLORIDE: 95 mmol/L — AB (ref 101–111)
CO2: 25 mmol/L (ref 22–32)
CREATININE: 1.55 mg/dL — AB (ref 0.44–1.00)
Calcium: >15 mg/dL (ref 8.9–10.3)
GFR calc Af Amer: 37 mL/min — ABNORMAL LOW (ref >60)
GFR calc non Af Amer: 32 mL/min — ABNORMAL LOW (ref >60)
Glucose, Bld: 107 mg/dL — ABNORMAL HIGH (ref 65–99)
Potassium: 3.8 mmol/L (ref 3.5–5.1)
SODIUM: 133 mmol/L — AB (ref 135–145)
Total Bilirubin: 0.4 mg/dL (ref 0.3–1.2)
Total Protein: 8.4 g/dL — ABNORMAL HIGH (ref 6.5–8.1)

## 2017-04-21 MED ORDER — LACTULOSE 20 GM/30ML PO SOLN: 30.0000 mL | ORAL | 3 refills | Status: DC

## 2017-04-21 MED ORDER — CALCITONIN (SALMON) 200 UNIT/ML IJ SOLN: 4.0000 [IU]/kg | Freq: Once | INTRAMUSCULAR | Status: DC

## 2017-04-21 MED ORDER — SODIUM CHLORIDE 0.9 % IV SOLN
Freq: Once | INTRAVENOUS | Status: AC
Start: 2017-04-21 — End: 2017-04-21
  Administered 2017-04-21: 14:00:00 via INTRAVENOUS

## 2017-04-21 MED ORDER — PROCHLORPERAZINE MALEATE 10 MG PO TABS
10.0000 mg | ORAL_TABLET | Freq: Once | ORAL | Status: AC
  Administered 2017-04-21: 10 mg via ORAL
  Filled 2017-04-21: qty 1

## 2017-04-21 MED ORDER — SODIUM CHLORIDE 0.9 % IV SOLN
20.0000 mg | Freq: Once | INTRAVENOUS | Status: AC
  Administered 2017-04-21: 20 mg via INTRAVENOUS
  Filled 2017-04-21: qty 2

## 2017-04-21 MED ORDER — CALCITONIN (SALMON) 200 UNIT/ML IJ SOLN
4.0000 [IU]/kg | Freq: Once | INTRAMUSCULAR | Status: AC
  Administered 2017-04-21: 220 [IU] via SUBCUTANEOUS
  Filled 2017-04-21: qty 1.1

## 2017-04-21 MED ORDER — BORTEZOMIB CHEMO SQ INJECTION 3.5 MG (2.5MG/ML)
1.3000 mg/m2 | Freq: Once | INTRAMUSCULAR | Status: AC
  Administered 2017-04-21: 2 mg via SUBCUTANEOUS
  Filled 2017-04-21: qty 2

## 2017-04-21 MED ORDER — SODIUM CHLORIDE 0.9 % IV SOLN
Freq: Once | INTRAVENOUS | Status: AC
  Administered 2017-04-21: 11:00:00 via INTRAVENOUS

## 2017-04-21 NOTE — Progress Notes (Signed)
2 liters of fluids given today per MD. See MAR for other medications given.   Treatment given per orders. Patient tolerated it well without problems. Vitals stable and discharged home from clinic via wheelchair. Follow up as scheduled.

## 2017-04-24 ENCOUNTER — Other Ambulatory Visit: Payer: Self-pay

## 2017-04-24 ENCOUNTER — Other Ambulatory Visit (HOSPITAL_COMMUNITY): Payer: Self-pay | Admitting: Emergency Medicine

## 2017-04-24 ENCOUNTER — Inpatient Hospital Stay (HOSPITAL_COMMUNITY): Payer: Medicare Other | Attending: Internal Medicine

## 2017-04-24 ENCOUNTER — Encounter (HOSPITAL_COMMUNITY): Payer: Self-pay

## 2017-04-24 DIAGNOSIS — C9000 Multiple myeloma not having achieved remission: Secondary | ICD-10-CM | POA: Diagnosis not present

## 2017-04-24 MED ORDER — SODIUM CHLORIDE 0.9 % IV SOLN
INTRAVENOUS | Status: DC
  Administered 2017-04-24: 12:00:00 via INTRAVENOUS

## 2017-04-24 MED ORDER — LACTULOSE 20 GM/30ML PO SOLN: 30.0000 mL | ORAL | 3 refills | Status: DC

## 2017-04-24 NOTE — Progress Notes (Signed)
Tolerated IVFs well.  Alert, in no distress.  VSS.  Discharged via wheelchair in c/o family.

## 2017-04-24 NOTE — Patient Instructions (Signed)
Today you received one liter of fluids - normal saline. Return as scheduled for your lab appointment, office visit, and Velcade injection. Call the clinic should you have any questions or concerns prior to your next visit.

## 2017-04-25 LAB — MULTIPLE MYELOMA PANEL, SERUM
ALBUMIN/GLOB SERPL: 0.7 (ref 0.7–1.7)
ALPHA2 GLOB SERPL ELPH-MCNC: 0.8 g/dL (ref 0.4–1.0)
Albumin SerPl Elph-Mcnc: 3.2 g/dL (ref 2.9–4.4)
Alpha 1: 0.3 g/dL (ref 0.0–0.4)
B-Globulin SerPl Elph-Mcnc: 3.4 g/dL — ABNORMAL HIGH (ref 0.7–1.3)
Gamma Glob SerPl Elph-Mcnc: 0.4 g/dL (ref 0.4–1.8)
Globulin, Total: 4.9 g/dL — ABNORMAL HIGH (ref 2.2–3.9)
IGA: 3870 mg/dL — AB (ref 64–422)
IGG (IMMUNOGLOBIN G), SERUM: 490 mg/dL — AB (ref 700–1600)
IGM (IMMUNOGLOBULIN M), SRM: 12 mg/dL — AB (ref 26–217)
M Protein SerPl Elph-Mcnc: 2.4 g/dL — ABNORMAL HIGH
Total Protein ELP: 8.1 g/dL (ref 6.0–8.5)

## 2017-04-25 NOTE — Progress Notes (Signed)
[facility] Initial Outpatient Consultation  Name: Deanna Holloway        MRN: [id number]      Date: 04/25/2017    DOB:June 25, 1945      REASON FOR REFERRAL:  Consultation for Gabriela Eves requested by Dr. Burney Gauze for transfer of care for IgA lambda multiple myeloma.  With trisomy 11   HISTORY OF PRESENT ILLNESS:Latiana Ju is a 72 y.o. female who was diagnosed with IgA lambda multiple myeloma back in 2018.  She was treated under the direction of Dr. Burney Gauze at [facility] in [place] she received 2 cycles of Velcade ending in August 2018 after patient refused to undergo any further treatments.  She has had severe hypercalcemia since then with multiple office visits.  She had been transfused for anemia has been given IV fluids and also received Xgeva for hypercalcemia without much benefit.  She continued to refuse treatment for a while but as of last week she was reconsidering treatment.  She is being transferred for continued care to [facility] because this is closer to her home.  Patient is accompanied by her caregiver today she is in wheelchair appears very weak. She was seen in the emergency room recently for constipation she was disimpacted and was sent home with MiraLAX she states this has not been working she has been vomiting. Caregiver states that she has been having very poor oral intake she has been's sleeping for the most part of the day and has essentially been wheelchair-bound. Patient's preference is to not take too many medications.  She tries to eat healthy and picks her food very carefully. She does complain of mild shortness of breath with activity.  No fever, no cough, no chest pain.  Her most recent M spike was 2.8 g/dL serum IgA level was 4027 mg/dL and her lambda light chain was 1843 mg/dL. She denies any hematochezia, melena.  All other systems review is negative at this time.  PAST MEDICAL HISTORY:    has a past medical history of Broken  arm, Cancer ([place]), Coma ([place]), Counseling regarding goals of care (09/15/2016), Hypercalcemia, and Multiple myeloma not having achieved remission ([place]) (09/15/2016).   ALLERGIES: is allergic to lasix [furosemide]; adhesive [tape]; and nickel.      PAST SURGICAL HISTORY History reviewed. No pertinent surgical history.   FAMILY HISTORY: Family History  Problem Relation Age of Onset  . Heart disease Mother   . Hypertension Mother      SOCIAL HISTORY:  reports that  has never smoked. she has never used smokeless tobacco. She reports that she does not drink alcohol or use drugs. She has 24-hour caregiver.  PERFORMANCE STATUS: The patient's performance status is 2 - Symptomatic, <50% confined to bed   PHYSICAL EXAM: Most Recent Vital Signs: Blood pressure (!) 98/46, pulse 91, temperature 98.3 F (36.8 C), temperature source Oral, resp. rate 18, height _0  (1.6 m), weight 121 lb (54.9 kg), SpO2 99 %. BP (!) 98/46 (BP Location: Left Arm, Patient Position: Sitting)   Pulse 91   Temp 98.3 F (36.8 C) (Oral)   Resp 18   Ht _1  (1.6 m)   Wt 121 lb (54.9 kg)   SpO2 99%   BMI 21.43 kg/m    General Appearance:    Lethargic, anxious, cachectic looking, wheelchair bound, accompanied by her caretaker  Head:    Normocephalic, without obvious abnormality, atraumatic  Eyes:    PERRL, dry conjunctiva/corneas clear, EOM's intact,  Nose:    no drainage    or sinus tenderness  Throat:   Lips, mucosa, and tongue very dry;  Neck:   Supple, symmetrical, trachea midline, no adenopathy;    no enlargement/tenderness/nodules;       Lungs:     Clear to auscultation bilaterally, respirations unlabored  Chest Wall:    No tenderness or deformity   Heart:    Regular rate and rhythm, ,      Abdomen:     Soft, non-tender, bowel sounds active all four quadrants,    no masses, no organomegaly        Extremities:   Extremities normal, atraumatic, no cyanosis or edema  Pulses:   2+ and symmetric  all extremities  Skin:   Skin pale, dry, poor turgor, no rashes or lesions  Lymph nodes:   Cervical, supraclavicular, and axillary nodes normal  Neurologic:   CNII-XII intact, poor motor strength - generalized    PATHOLOGY:   A bone marrow biopsy from April 2018 shows a hypercellular bone marrow with 89% infiltration with plasma cells that show lambda light chain restriction.   ASSESSMENT: IgA lambda multiple myeloma with severe hypercalcemia, renal failure, anemia.  Patient's performance status has been declining due to progressive myeloma.  She has refused treatment and has not been treated for greater than 6 months now.  She has also not been compliant with fluid intake.  She received intermittent doses of Xgeva with little benefit.  She also receives Zometa. Today we had an extensive discussion with the patient accompanied by her caregiver today.  She is afraid of the side effects of treatment.  She believes that she had gotten sick due to side effects from Velcade in the past.  However I believe that the adverse effects that she experienced may have been worse and due to concurrent hypercalcemia and dehydration at that time.  I have explained to her that we could potentially resume Velcade subcutaneously given once a week.  Also explained to her that Decadron would help improve the hypercalcemia and would also work against multiple myeloma. Also discussed the life-threatening situation she is in even though she has been fortunate to have not suffered extreme consequences from hypercalcemia so far.  However I believe that dehydration with allergy and constipation are likely secondary to the hypercalcemia.  And that without addressing this, she is very likely to require hospitalization and risk of fatal arrhythmias, seizures etc. Finally she agreed to have IV fluids today agreed to have Velcade injection today agreed for calcitonin and Decadron.  We will proceed with the above today and will  continue to engage her with discussions regarding continuing this treatment.  We will have her return twice a week for fluids.  For constipation have given her prescription for lactulose with instructions. She was given Compazine for nausea. She was encouraged to continue drinking plenty of fluids at home.  I have not added aspirin or herpes zoster prophylaxis at this point since she is so resistant to any new medications.  We will gradually add acyclovir, aspirin and also consider adding Revlimid with her consent she improves clinically and hopefully becomes more cooperative. Total face-to-face time spent with the patient today was 1 hour nearly 45 minutes of that time was spent counseling and coordinating her care. Please see Dr. Freddie Apley notes for details regarding her original diagnosis.       Plan: Orders Placed This Encounter  Procedures  . CBC with Differential/Platelet  . Comprehensive  metabolic panel  . Multiple Myeloma Panel (SPEP&IFE w/QIG)   Meds ordered this encounter  Medications  . calcitonin (MIACALCIN) injection 220 Units  . Lactulose 20 GM/30ML SOLN    Sig: Take 30 mLs (20 g total) by mouth every 4 (four) hours while awake. Until a loose bowel movement, then switch to taking 30 ml once at bedtime every day. Stop if you have diarrhea    Dispense:  1000 mL    Refill:  3   IV fluids today and decadron injection today Return on Monday next week for IV fluids and labs Injection Velcade [place] weekly

## 2017-04-27 ENCOUNTER — Other Ambulatory Visit (HOSPITAL_COMMUNITY): Payer: Self-pay | Admitting: *Deleted

## 2017-04-27 DIAGNOSIS — C9000 Multiple myeloma not having achieved remission: Secondary | ICD-10-CM

## 2017-04-28 ENCOUNTER — Inpatient Hospital Stay (HOSPITAL_BASED_OUTPATIENT_CLINIC_OR_DEPARTMENT_OTHER): Payer: Medicare Other | Admitting: Internal Medicine

## 2017-04-28 ENCOUNTER — Inpatient Hospital Stay (HOSPITAL_COMMUNITY): Payer: Medicare Other | Attending: Oncology

## 2017-04-28 ENCOUNTER — Encounter (HOSPITAL_COMMUNITY): Payer: Self-pay | Admitting: Internal Medicine

## 2017-04-28 ENCOUNTER — Ambulatory Visit (HOSPITAL_COMMUNITY): Payer: Medicare Other | Admitting: Internal Medicine

## 2017-04-28 ENCOUNTER — Other Ambulatory Visit: Payer: Self-pay

## 2017-04-28 ENCOUNTER — Inpatient Hospital Stay (HOSPITAL_COMMUNITY): Payer: Medicare Other

## 2017-04-28 DIAGNOSIS — E871 Hypo-osmolality and hyponatremia: Secondary | ICD-10-CM | POA: Diagnosis not present

## 2017-04-28 DIAGNOSIS — C9000 Multiple myeloma not having achieved remission: Secondary | ICD-10-CM | POA: Diagnosis not present

## 2017-04-28 DIAGNOSIS — D649 Anemia, unspecified: Secondary | ICD-10-CM | POA: Insufficient documentation

## 2017-04-28 DIAGNOSIS — N189 Chronic kidney disease, unspecified: Secondary | ICD-10-CM | POA: Insufficient documentation

## 2017-04-28 DIAGNOSIS — Z5112 Encounter for antineoplastic immunotherapy: Secondary | ICD-10-CM | POA: Insufficient documentation

## 2017-04-28 DIAGNOSIS — E876 Hypokalemia: Secondary | ICD-10-CM | POA: Diagnosis not present

## 2017-04-28 DIAGNOSIS — R451 Restlessness and agitation: Secondary | ICD-10-CM | POA: Diagnosis not present

## 2017-04-28 DIAGNOSIS — Z7952 Long term (current) use of systemic steroids: Secondary | ICD-10-CM | POA: Diagnosis not present

## 2017-04-28 DIAGNOSIS — R7989 Other specified abnormal findings of blood chemistry: Secondary | ICD-10-CM | POA: Diagnosis not present

## 2017-04-28 LAB — COMPREHENSIVE METABOLIC PANEL
ALBUMIN: 2.8 g/dL — AB (ref 3.5–5.0)
ALT: 12 U/L — AB (ref 14–54)
AST: 27 U/L (ref 15–41)
Alkaline Phosphatase: 130 U/L — ABNORMAL HIGH (ref 38–126)
Anion gap: 12 (ref 5–15)
BILIRUBIN TOTAL: 0.4 mg/dL (ref 0.3–1.2)
BUN: 21 mg/dL — AB (ref 6–20)
CHLORIDE: 96 mmol/L — AB (ref 101–111)
CO2: 21 mmol/L — ABNORMAL LOW (ref 22–32)
CREATININE: 1.59 mg/dL — AB (ref 0.44–1.00)
Calcium: 11.9 mg/dL — ABNORMAL HIGH (ref 8.9–10.3)
GFR calc Af Amer: 36 mL/min — ABNORMAL LOW (ref >60)
GFR, EST NON AFRICAN AMERICAN: 31 mL/min — AB (ref >60)
GLUCOSE: 112 mg/dL — AB (ref 65–99)
POTASSIUM: 3.1 mmol/L — AB (ref 3.5–5.1)
Sodium: 129 mmol/L — ABNORMAL LOW (ref 135–145)
TOTAL PROTEIN: 8.3 g/dL — AB (ref 6.5–8.1)

## 2017-04-28 LAB — CBC WITH DIFFERENTIAL/PLATELET
BASOS ABS: 0 10*3/uL (ref 0.0–0.1)
BASOS PCT: 0 %
Eosinophils Absolute: 0 10*3/uL (ref 0.0–0.7)
Eosinophils Relative: 1 %
HEMATOCRIT: 23.8 % — AB (ref 36.0–46.0)
Hemoglobin: 7.9 g/dL — ABNORMAL LOW (ref 12.0–15.0)
LYMPHS PCT: 24 %
Lymphs Abs: 1.3 10*3/uL (ref 0.7–4.0)
MCH: 30.4 pg (ref 26.0–34.0)
MCHC: 33.2 g/dL (ref 30.0–36.0)
MCV: 91.5 fL (ref 78.0–100.0)
Monocytes Absolute: 0.4 10*3/uL (ref 0.1–1.0)
Monocytes Relative: 8 %
NEUTROS PCT: 67 %
Neutro Abs: 3.6 10*3/uL (ref 1.7–7.7)
Platelets: 156 10*3/uL (ref 150–400)
RBC: 2.6 MIL/uL — AB (ref 3.87–5.11)
RDW: 15.3 % (ref 11.5–15.5)
WBC: 5.3 10*3/uL (ref 4.0–10.5)

## 2017-04-28 LAB — SAMPLE TO BLOOD BANK

## 2017-04-28 MED ORDER — PROCHLORPERAZINE MALEATE 10 MG PO TABS
ORAL_TABLET | ORAL | Status: AC
  Filled 2017-04-28: qty 1

## 2017-04-28 MED ORDER — DEXAMETHASONE 4 MG PO TABS: 40.0000 mg | ORAL_TABLET | ORAL | 2 refills | Status: DC

## 2017-04-28 MED ORDER — PROCHLORPERAZINE MALEATE 10 MG PO TABS
10.0000 mg | ORAL_TABLET | Freq: Once | ORAL | Status: AC
  Administered 2017-04-28: 10 mg via ORAL

## 2017-04-28 MED ORDER — BORTEZOMIB CHEMO SQ INJECTION 3.5 MG (2.5MG/ML)
1.3000 mg/m2 | Freq: Once | INTRAMUSCULAR | Status: AC
  Administered 2017-04-28: 2 mg via SUBCUTANEOUS
  Filled 2017-04-28: qty 2

## 2017-04-28 NOTE — Progress Notes (Signed)
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  She tolerated last week's Velcade very well. She states her energy is improved she is in a better mood and less dehydrated today.  No symptoms of peripheral neuropathy.   Constipation responded to lactulose she will continue to take that.  Vitals stable Blood pressure (!) 110/52, pulse 95, temperature 98.4 F (36.9 C), temperature source Oral, resp. rate 16, height '5\' 3"'  (1.6 m), weight 120 lb (54.4 kg), SpO2 100 %. Exam shows her to be well hydrated with normal skin turgor, she is less lethargic, and in good spirits. Pallor is present, no petechiae, no leg edema AAOX3, no focal motor deficits.  Impression and Plan:  Multiple myeloma- appears to be clinically responding with some decrease in total protein.Does not want to have IV accessed for Decadron injection therefore Decadron has been changed to 40 mg p.o. to be taken weekly prescription called to the pharmacy.   Proceed with Velcade injection today. We will gradullay address addign Revlimid in the future. She has been resistent to any therapy until last week and is very fearful of trying anything new.  Severe hypercalcemia- improved Given that both Xgeva and Zometa given over the last 2 weeks,  she is at risk for  hypocalcemia, we will continue to monitor her electrolytes.      Chronic hyponatremia noted at 128 patient is asymptomatic.  It could be multifactorial in etiology, in part could be pseudohyponatremia due to monoclonal proteinemia. Advised her to increase salt intake.  Hypopotassemia, she opted to take bananas and apple juice and orange juice does not want any potassium pills.  I advised her to increase her protein intake and fiber intake.   CKD: Serum creatinine is up at 1.59 we will continue to monitor this closely.    Return next week for her week 3 Velcade and repeat  labs. We will see her back in follow-up in approximately 3 weeks.

## 2017-04-28 NOTE — Progress Notes (Signed)
Patient ok to receive velcade today.  Patient will not receive decadron IV today verbal order Dr. Sherrine Maples.  Patient being switched to oral decadron.    Patient tolerated velcade shot with no complaints voiced.  Injection site clean and dry with no bruising or swelling noted at site.  Band aid applied.  Reviewed decadron directions with the patient and family with understanding verbalized.  After hour phone number for the triage nurse given in written form and highlited for the family and patient.  VSS with discharge and left via wheelchair with family.  No s/s of distress noted.

## 2017-04-28 NOTE — Progress Notes (Signed)
Called patient about potassium. She declined to take potassium by prescription but agreed to eat 4 bananas per day and apple and orange juice.

## 2017-04-28 NOTE — Patient Instructions (Signed)
[facility] Discharge Instructions for Patients Receiving Chemotherapy  Today you received the following chemotherapy agents Velcade.      If you develop nausea and vomiting that is not controlled by your nausea medication, call the clinic.   BELOW ARE SYMPTOMS THAT SHOULD BE REPORTED IMMEDIATELY:  *FEVER GREATER THAN 100.5 F  *CHILLS WITH OR WITHOUT FEVER  NAUSEA AND VOMITING THAT IS NOT CONTROLLED WITH YOUR NAUSEA MEDICATION  *UNUSUAL SHORTNESS OF BREATH  *UNUSUAL BRUISING OR BLEEDING  TENDERNESS IN MOUTH AND THROAT WITH OR WITHOUT PRESENCE OF ULCERS  *URINARY PROBLEMS  *BOWEL PROBLEMS  UNUSUAL RASH Items with * indicate a potential emergency and should be followed up as soon as possible.  Feel free to call the clinic should you have any questions or concerns. The clinic phone number is [phone].  Please show the CHEMO ALERT CARD at check-in to the Emergency Department and triage nurse.   

## 2017-05-04 ENCOUNTER — Other Ambulatory Visit (HOSPITAL_COMMUNITY): Payer: Self-pay | Admitting: *Deleted

## 2017-05-04 DIAGNOSIS — C9000 Multiple myeloma not having achieved remission: Secondary | ICD-10-CM

## 2017-05-05 ENCOUNTER — Encounter (HOSPITAL_COMMUNITY): Payer: Self-pay

## 2017-05-05 ENCOUNTER — Inpatient Hospital Stay (HOSPITAL_COMMUNITY): Payer: Medicare Other

## 2017-05-05 ENCOUNTER — Other Ambulatory Visit: Payer: Self-pay

## 2017-05-05 ENCOUNTER — Inpatient Hospital Stay (HOSPITAL_BASED_OUTPATIENT_CLINIC_OR_DEPARTMENT_OTHER): Payer: Medicare Other | Admitting: Internal Medicine

## 2017-05-05 VITALS — BP 96/46 | HR 84 | Temp 98.6°F | Resp 18 | Wt 120.2 lb

## 2017-05-05 VITALS — BP 108/61 | HR 82 | Temp 98.0°F | Resp 16 | Ht 63.0 in | Wt 120.1 lb

## 2017-05-05 DIAGNOSIS — D649 Anemia, unspecified: Secondary | ICD-10-CM

## 2017-05-05 DIAGNOSIS — E860 Dehydration: Secondary | ICD-10-CM

## 2017-05-05 DIAGNOSIS — R7989 Other specified abnormal findings of blood chemistry: Secondary | ICD-10-CM

## 2017-05-05 DIAGNOSIS — C9000 Multiple myeloma not having achieved remission: Secondary | ICD-10-CM

## 2017-05-05 DIAGNOSIS — R451 Restlessness and agitation: Secondary | ICD-10-CM | POA: Diagnosis not present

## 2017-05-05 DIAGNOSIS — R5383 Other fatigue: Secondary | ICD-10-CM

## 2017-05-05 DIAGNOSIS — R531 Weakness: Secondary | ICD-10-CM

## 2017-05-05 DIAGNOSIS — Z5112 Encounter for antineoplastic immunotherapy: Secondary | ICD-10-CM | POA: Diagnosis not present

## 2017-05-05 DIAGNOSIS — E871 Hypo-osmolality and hyponatremia: Secondary | ICD-10-CM

## 2017-05-05 DIAGNOSIS — Z7952 Long term (current) use of systemic steroids: Secondary | ICD-10-CM

## 2017-05-05 DIAGNOSIS — N189 Chronic kidney disease, unspecified: Secondary | ICD-10-CM | POA: Diagnosis not present

## 2017-05-05 DIAGNOSIS — D508 Other iron deficiency anemias: Secondary | ICD-10-CM

## 2017-05-05 LAB — CBC WITH DIFFERENTIAL/PLATELET
Basophils Absolute: 0 10*3/uL (ref 0.0–0.1)
Basophils Relative: 0 %
Eosinophils Absolute: 0.1 10*3/uL (ref 0.0–0.7)
Eosinophils Relative: 2 %
HCT: 23.2 % — ABNORMAL LOW (ref 36.0–46.0)
HEMOGLOBIN: 7.7 g/dL — AB (ref 12.0–15.0)
LYMPHS ABS: 1.3 10*3/uL (ref 0.7–4.0)
LYMPHS PCT: 24 %
MCH: 31 pg (ref 26.0–34.0)
MCHC: 33.2 g/dL (ref 30.0–36.0)
MCV: 93.5 fL (ref 78.0–100.0)
MONOS PCT: 10 %
Monocytes Absolute: 0.5 10*3/uL (ref 0.1–1.0)
NEUTROS PCT: 64 %
Neutro Abs: 3.4 10*3/uL (ref 1.7–7.7)
Platelets: 134 10*3/uL — ABNORMAL LOW (ref 150–400)
RBC: 2.48 MIL/uL — ABNORMAL LOW (ref 3.87–5.11)
RDW: 16.3 % — ABNORMAL HIGH (ref 11.5–15.5)
WBC: 5.3 10*3/uL (ref 4.0–10.5)

## 2017-05-05 LAB — COMPREHENSIVE METABOLIC PANEL
ALK PHOS: 215 U/L — AB (ref 38–126)
ALT: 13 U/L — AB (ref 14–54)
ANION GAP: 11 (ref 5–15)
AST: 20 U/L (ref 15–41)
Albumin: 2.7 g/dL — ABNORMAL LOW (ref 3.5–5.0)
BUN: 22 mg/dL — ABNORMAL HIGH (ref 6–20)
CALCIUM: 10.6 mg/dL — AB (ref 8.9–10.3)
CO2: 19 mmol/L — ABNORMAL LOW (ref 22–32)
Chloride: 104 mmol/L (ref 101–111)
Creatinine, Ser: 1.8 mg/dL — ABNORMAL HIGH (ref 0.44–1.00)
GFR, EST AFRICAN AMERICAN: 31 mL/min — AB (ref >60)
GFR, EST NON AFRICAN AMERICAN: 27 mL/min — AB (ref >60)
Glucose, Bld: 109 mg/dL — ABNORMAL HIGH (ref 65–99)
Potassium: 3.1 mmol/L — ABNORMAL LOW (ref 3.5–5.1)
Sodium: 134 mmol/L — ABNORMAL LOW (ref 135–145)
TOTAL PROTEIN: 7.7 g/dL (ref 6.5–8.1)
Total Bilirubin: 0.2 mg/dL — ABNORMAL LOW (ref 0.3–1.2)

## 2017-05-05 LAB — PREPARE RBC (CROSSMATCH)

## 2017-05-05 MED ORDER — SODIUM CHLORIDE 0.9 % IV SOLN: INTRAVENOUS | Status: DC

## 2017-05-05 MED ORDER — SODIUM CHLORIDE 0.9 % IV SOLN: 250.0000 mL | Freq: Once | INTRAVENOUS | Status: DC

## 2017-05-05 MED ORDER — PROCHLORPERAZINE MALEATE 10 MG PO TABS: 10.0000 mg | ORAL_TABLET | Freq: Once | ORAL | Status: DC

## 2017-05-05 MED ORDER — ACETAMINOPHEN 325 MG PO TABS
650.0000 mg | ORAL_TABLET | Freq: Once | ORAL | Status: AC
  Administered 2017-05-05: 650 mg via ORAL
  Filled 2017-05-05: qty 2

## 2017-05-05 MED ORDER — BORTEZOMIB CHEMO SQ INJECTION 3.5 MG (2.5MG/ML)
1.3000 mg/m2 | Freq: Once | INTRAMUSCULAR | Status: AC
  Administered 2017-05-05: 2 mg via SUBCUTANEOUS
  Filled 2017-05-05: qty 2

## 2017-05-05 MED ORDER — DIPHENHYDRAMINE HCL 25 MG PO CAPS
25.0000 mg | ORAL_CAPSULE | Freq: Once | ORAL | Status: AC
  Administered 2017-05-05: 25 mg via ORAL
  Filled 2017-05-05: qty 1

## 2017-05-05 MED ORDER — SODIUM CHLORIDE 0.9 % IV SOLN
500.0000 mL | INTRAVENOUS | Status: DC
  Administered 2017-05-05: 1000 mL via INTRAVENOUS

## 2017-05-05 MED ORDER — SODIUM CHLORIDE 0.9% FLUSH: 3.0000 mL | INTRAVENOUS | Status: DC | PRN

## 2017-05-05 MED ORDER — SODIUM CHLORIDE 0.9% FLUSH
10.0000 mL | INTRAVENOUS | Status: AC | PRN
  Administered 2017-05-05: 10 mL

## 2017-05-05 NOTE — Addendum Note (Signed)
Addended by: Charlyne Petrin B on: 05/05/2017 03:49 PM   Modules accepted: Orders, SmartSet

## 2017-05-05 NOTE — Patient Instructions (Signed)
[facility] Discharge Instructions for Patients Receiving Chemotherapy  Today you received the following chemotherapy agents Velcade and a blood transfusion today.  Call for any questions or concerns.     If you develop nausea and vomiting that is not controlled by your nausea medication, call the clinic.   BELOW ARE SYMPTOMS THAT SHOULD BE REPORTED IMMEDIATELY:  *FEVER GREATER THAN 100.5 F  *CHILLS WITH OR WITHOUT FEVER  NAUSEA AND VOMITING THAT IS NOT CONTROLLED WITH YOUR NAUSEA MEDICATION  *UNUSUAL SHORTNESS OF BREATH  *UNUSUAL BRUISING OR BLEEDING  TENDERNESS IN MOUTH AND THROAT WITH OR WITHOUT PRESENCE OF ULCERS  *URINARY PROBLEMS  *BOWEL PROBLEMS  UNUSUAL RASH Items with * indicate a potential emergency and should be followed up as soon as possible.  Feel free to call the clinic should you have any questions or concerns. The clinic phone number is [phone].  Please show the [facility] at check-in to the Emergency Department and triage nurse.

## 2017-05-05 NOTE — Progress Notes (Signed)
Patient to treatment area for velcade shot and oncology follow up visit.  Reviewed medications and patient is refusing to take her decadron because she does not like the way it makes her feel.  Stated she does not have "mobility" and "cannot sleep".  Reviewed the side effects of decadron with understanding verbalized but still refused to take decadron for velcade shot.  Appetite fair.  Stated she "eats when she wants to" and everything is "variable" when she does eat.  PRN lower leg swelling and small amount per patient.  None noted with today's visit.  Does not like taking lactulose for constipation.  Family at side.   Ok for velcade treatment with hydration and one unit of blood for today.  Patient instructed by oncologist to take 2 decadron pills day of velcade shot.  Patient does not need compazine today verbal order Dr. Sherrine Maples.    Blood bank called regarding patients transfusion.  The patient has an antibody and will have to wait until Monday for transfusion.  Explained to the patient with understanding verbalized.  Instructed the patient do not remove the blue blood bracelet because it will be needed on Monday.  Patient verbalized understanding.   Patient tolerated hydration and velcade shot with no complaints voiced.  Velcade shot with no bruising or swelling noted at site.  Band aid applied.  Left peripheral IV site clean and dry with no bruising or swelling noted.  Band aid applied.  Reminded the patient again to not remove her blood bracelet for Monday's transfusion with understanding verbalized.  VSs with discharge and left via wheelchair with friend.  No s/s of distress noted.

## 2017-05-08 ENCOUNTER — Encounter (HOSPITAL_COMMUNITY): Payer: Self-pay

## 2017-05-08 ENCOUNTER — Inpatient Hospital Stay (HOSPITAL_COMMUNITY): Payer: Medicare Other

## 2017-05-08 DIAGNOSIS — D649 Anemia, unspecified: Secondary | ICD-10-CM

## 2017-05-08 DIAGNOSIS — N189 Chronic kidney disease, unspecified: Secondary | ICD-10-CM | POA: Diagnosis not present

## 2017-05-08 DIAGNOSIS — Z5112 Encounter for antineoplastic immunotherapy: Secondary | ICD-10-CM | POA: Diagnosis not present

## 2017-05-08 DIAGNOSIS — E860 Dehydration: Secondary | ICD-10-CM

## 2017-05-08 DIAGNOSIS — D508 Other iron deficiency anemias: Secondary | ICD-10-CM

## 2017-05-08 DIAGNOSIS — E871 Hypo-osmolality and hyponatremia: Secondary | ICD-10-CM | POA: Diagnosis not present

## 2017-05-08 DIAGNOSIS — C9000 Multiple myeloma not having achieved remission: Secondary | ICD-10-CM

## 2017-05-08 DIAGNOSIS — R531 Weakness: Secondary | ICD-10-CM

## 2017-05-08 DIAGNOSIS — R5383 Other fatigue: Secondary | ICD-10-CM

## 2017-05-08 MED ORDER — SODIUM CHLORIDE 0.9 % IV SOLN
250.0000 mL | Freq: Once | INTRAVENOUS | Status: AC
  Administered 2017-05-08: 250 mL via INTRAVENOUS

## 2017-05-08 MED ORDER — DIPHENHYDRAMINE HCL 25 MG PO CAPS
ORAL_CAPSULE | ORAL | Status: AC
  Filled 2017-05-08: qty 1

## 2017-05-08 MED ORDER — ACETAMINOPHEN 325 MG PO TABS
ORAL_TABLET | ORAL | Status: AC
  Filled 2017-05-08: qty 2

## 2017-05-08 MED ORDER — ACETAMINOPHEN 325 MG PO TABS
650.0000 mg | ORAL_TABLET | Freq: Once | ORAL | Status: AC
  Administered 2017-05-08: 650 mg via ORAL

## 2017-05-08 MED ORDER — DIPHENHYDRAMINE HCL 25 MG PO CAPS
25.0000 mg | ORAL_CAPSULE | Freq: Once | ORAL | Status: AC
  Administered 2017-05-08: 25 mg via ORAL

## 2017-05-08 NOTE — Patient Instructions (Signed)
[place] at [facility] Discharge Instructions  RECOMMENDATIONS MADE BY THE CONSULTANT AND ANY TEST RESULTS WILL BE SENT TO YOUR REFERRING PHYSICIAN.  Received 1 unit of PRBC's today. Follow-up as scheduled. Call clinic for any questions or concerns  Thank you for choosing [place] at [facility] to provide your oncology and hematology care.  To afford each patient quality time with our provider, please arrive at least 15 minutes before your scheduled appointment time.    If you have a lab appointment with the [facility] please come in thru the  Main Entrance and check in at the main information desk  You need to re-schedule your appointment should you arrive 10 or more minutes late.  We strive to give you quality time with our providers, and arriving late affects you and other patients whose appointments are after yours.  Also, if you no show three or more times for appointments you may be dismissed from the clinic at the providers discretion.     Again, thank you for choosing [facility].  Our hope is that these requests will decrease the amount of time that you wait before being seen by our physicians.       _____________________________________________________________  Should you have questions after your visit to [facility], please contact our office at [phone] between the hours of 8:30 a.m. and 4:30 p.m.  Voicemails left after 4:30 p.m. will not be returned until the following business day.  For prescription refill requests, have your pharmacy contact our office.       Resources For Cancer Patients and their Caregivers ? American Cancer Society: Can assist with transportation, wigs, general needs, runs Look Good Feel Better.        804 064 3720 ? Cancer Care: Provides financial assistance, online support groups, medication/co-pay assistance.  1-800-813-HOPE 201-029-4459) ? [place] Assists [place] cancer patients and their families through emotional , educational and financial support.  ([phone] [phone] ? [place] DSS Where to apply for food stamps, Medicaid and utility assistance. (618) 174-1603 ? RCATS: Transportation to medical appointments. ([phone] ? Social Security Administration: May apply for disability if have a Stage IV cancer. [phone] 563-088-4772 ? [employer], Disability and Transit Services: Assists with nutrition, care and transit needs. [place] Support Programs: @10RELATIVEDAYS @ > Cancer Support Group  2nd Tuesday of the month 1pm-2pm, Journey Room  > Creative Journey  3rd Tuesday of the month 1130am-1pm, Journey Room  > Look Good Feel Better  1st Wednesday of the month 10am-12 noon, Journey Room (Call [facility] to register [phone])

## 2017-05-08 NOTE — Progress Notes (Signed)
Gabriela Eves tolerated blood transfusion well without complaints or incident. VSS prior to,during and after transfusion. Pt discharged via wheelchair in satisfactory condition accompanied by a friend

## 2017-05-09 LAB — BPAM RBC
Blood Product Expiration Date: 201902112359
ISSUE DATE / TIME: 201902111107
Unit Type and Rh: 6200

## 2017-05-09 LAB — TYPE AND SCREEN
ABO/RH(D): A POS
ANTIBODY SCREEN: POSITIVE
Unit division: 0

## 2017-05-11 ENCOUNTER — Other Ambulatory Visit (HOSPITAL_COMMUNITY): Payer: Self-pay | Admitting: *Deleted

## 2017-05-11 DIAGNOSIS — C9000 Multiple myeloma not having achieved remission: Secondary | ICD-10-CM

## 2017-05-12 ENCOUNTER — Ambulatory Visit (HOSPITAL_COMMUNITY): Payer: Medicare Other | Admitting: Internal Medicine

## 2017-05-12 ENCOUNTER — Other Ambulatory Visit (HOSPITAL_COMMUNITY): Payer: Medicare Other

## 2017-05-12 ENCOUNTER — Ambulatory Visit (HOSPITAL_COMMUNITY): Payer: Medicare Other

## 2017-05-12 NOTE — Progress Notes (Signed)
[facility] OFFICE PROGRESS NOTE   Name: Deanna Holloway MRN: [id number]  Date: 05/05/2017  DOB: [birth year]   DIAGNOSIS: The encounter diagnosis was Multiple myeloma not having achieved remission ([place]).  Interval history: Complains of feeling restless and attributes to steroids, denies any pain, managing constipation well with lactulose Very little oral in take, has not been able to get around as much as she wants to, feels tired. No paresthesias, no falls, appetite is fair, urinating well No fever/ leg edema. All other systems review is negative.  Lab Results  Component Value Date   CREATININE 1.80 (H) 05/05/2017   CREATININE 1.59 (H) 04/28/2017   CREATININE 1.55 (H) 04/21/2017   Lab Results  Component Value Date   WBC 5.3 05/05/2017   HGB 7.7 (L) 05/05/2017   HCT 23.2 (L) 05/05/2017   PLT 134 (L) 05/05/2017   GLUCOSE 109 (H) 05/05/2017   ALT 13 (L) 05/05/2017   AST 20 05/05/2017   NA 134 (L) 05/05/2017   K 3.1 (L) 05/05/2017   CL 104 05/05/2017   CREATININE 1.80 (H) 05/05/2017   BUN 22 (H) 05/05/2017   CO2 19 (L) 05/05/2017       PHYSICAL EXAM:  height is _0  (1.6 m) and weight is 120 lb 1.6 oz (54.5 kg). Her oral temperature is 98 F (36.7 C). Her blood pressure is 108/61 and her pulse is 82. Her respiration is 16 and oxygen saturation is 100%.    General appearance: alert, cooperative, fatigued, no distress and pale Resp: clear to auscultation bilaterally Back: symmetric, no curvature. ROM normal. No CVA tenderness. Extremities: extremities normal, atraumatic, no cyanosis or edema Neurologic: Grossly normal   IMPRESSION AND PLAN:  Multiple myeloma- IgA  Proceed with Injection Velcade today- Day 15 Cycle 1 Resume Xgeva cycle 3  Steroid induced restlessness, patient wants to quit decadron, but I was able to persuade her to continue it at a reduced dose of 4 mg weekly, we will attempt to increase it slowly to at;least 20 mg if she tolerates  it.  Anemia - dropped hgb, no bleeding, transfuse 1 unit PRBC today  Rising serum creatinine - 1.8, likely form poor oral intake of fluids- 1 litre normal saline IV today. If continues to rise, plan US kidneys.  Return 1 week- repeatt labs, continue with weekly [place] Velcade, and Decadron.  Resume Xgeva with cycle 3 if no hypocalcemia.  Potassium still ow, she does not want potassium supplements- advised to increase K diet Chronic hyponatremia-likely combination of pseudohyponatremia and dehydration,  increase slat intake- fluids

## 2017-05-16 ENCOUNTER — Inpatient Hospital Stay (HOSPITAL_BASED_OUTPATIENT_CLINIC_OR_DEPARTMENT_OTHER): Payer: Medicare Other | Admitting: Oncology

## 2017-05-16 ENCOUNTER — Inpatient Hospital Stay (HOSPITAL_COMMUNITY): Payer: Medicare Other

## 2017-05-16 ENCOUNTER — Encounter (HOSPITAL_COMMUNITY): Payer: Self-pay | Admitting: Oncology

## 2017-05-16 ENCOUNTER — Encounter: Payer: Self-pay | Admitting: Oncology

## 2017-05-16 VITALS — BP 109/53 | HR 85 | Temp 98.0°F | Resp 16 | Wt 118.6 lb

## 2017-05-16 DIAGNOSIS — Z7952 Long term (current) use of systemic steroids: Secondary | ICD-10-CM | POA: Diagnosis not present

## 2017-05-16 DIAGNOSIS — Z5112 Encounter for antineoplastic immunotherapy: Secondary | ICD-10-CM | POA: Diagnosis not present

## 2017-05-16 DIAGNOSIS — C9000 Multiple myeloma not having achieved remission: Secondary | ICD-10-CM

## 2017-05-16 DIAGNOSIS — N189 Chronic kidney disease, unspecified: Secondary | ICD-10-CM | POA: Diagnosis not present

## 2017-05-16 DIAGNOSIS — R7989 Other specified abnormal findings of blood chemistry: Secondary | ICD-10-CM

## 2017-05-16 DIAGNOSIS — D649 Anemia, unspecified: Secondary | ICD-10-CM | POA: Diagnosis not present

## 2017-05-16 DIAGNOSIS — R451 Restlessness and agitation: Secondary | ICD-10-CM

## 2017-05-16 DIAGNOSIS — E871 Hypo-osmolality and hyponatremia: Secondary | ICD-10-CM | POA: Diagnosis not present

## 2017-05-16 LAB — CBC WITH DIFFERENTIAL/PLATELET
Basophils Absolute: 0 10*3/uL (ref 0.0–0.1)
Basophils Relative: 1 %
EOS ABS: 0.1 10*3/uL (ref 0.0–0.7)
EOS PCT: 1 %
HCT: 25.7 % — ABNORMAL LOW (ref 36.0–46.0)
Hemoglobin: 8.7 g/dL — ABNORMAL LOW (ref 12.0–15.0)
LYMPHS ABS: 1.4 10*3/uL (ref 0.7–4.0)
LYMPHS PCT: 38 %
MCH: 31.4 pg (ref 26.0–34.0)
MCHC: 33.9 g/dL (ref 30.0–36.0)
MCV: 92.8 fL (ref 78.0–100.0)
MONO ABS: 0.1 10*3/uL (ref 0.1–1.0)
MONOS PCT: 2 %
Neutro Abs: 2.1 10*3/uL (ref 1.7–7.7)
Neutrophils Relative %: 58 %
PLATELETS: 105 10*3/uL — AB (ref 150–400)
RBC: 2.77 MIL/uL — AB (ref 3.87–5.11)
RDW: 16.9 % — AB (ref 11.5–15.5)
WBC: 3.6 10*3/uL — AB (ref 4.0–10.5)

## 2017-05-16 LAB — COMPREHENSIVE METABOLIC PANEL
ALT: 12 U/L — ABNORMAL LOW (ref 14–54)
ANION GAP: 11 (ref 5–15)
AST: 22 U/L (ref 15–41)
Albumin: 2.9 g/dL — ABNORMAL LOW (ref 3.5–5.0)
Alkaline Phosphatase: 197 U/L — ABNORMAL HIGH (ref 38–126)
BUN: 20 mg/dL (ref 6–20)
CHLORIDE: 103 mmol/L (ref 101–111)
CO2: 21 mmol/L — AB (ref 22–32)
Calcium: 12.1 mg/dL — ABNORMAL HIGH (ref 8.9–10.3)
Creatinine, Ser: 1.79 mg/dL — ABNORMAL HIGH (ref 0.44–1.00)
GFR calc non Af Amer: 27 mL/min — ABNORMAL LOW (ref >60)
GFR, EST AFRICAN AMERICAN: 31 mL/min — AB (ref >60)
Glucose, Bld: 105 mg/dL — ABNORMAL HIGH (ref 65–99)
POTASSIUM: 3.5 mmol/L (ref 3.5–5.1)
SODIUM: 135 mmol/L (ref 135–145)
Total Bilirubin: 0.5 mg/dL (ref 0.3–1.2)
Total Protein: 7.9 g/dL (ref 6.5–8.1)

## 2017-05-16 LAB — LACTATE DEHYDROGENASE: LDH: 123 U/L (ref 98–192)

## 2017-05-16 NOTE — Progress Notes (Signed)
[facility] OFFICE PROGRESS NOTE   Name: Deanna Holloway MRN: [id number]  Date: 05/05/2017  DOB: [birth year]   DIAGNOSIS: The encounter diagnosis was Multiple myeloma not having achieved remission ([place]).  Interval history: Patient states she "feels great". She has been able to walk using her cane only. States her walking has improved her quality of life and she refuses to be in a wheelchair. States recently her son was admitted to the hospital and she currently "has too much on her plate to have treatment". She is not complaining of feeling restless at this visit. She is not currently taking her Decadron as prescribed. She denies any pain. She does not complain of constipation. States she is trying to eat more potassium rich foods and drink as many fluids as she can tolerate. She feels her appetite is good. She denies fevers, urinary symptoms, constipation, diarrhea, peripheral edema or neuropathy at this time.   Lab Results  Component Value Date   CREATININE 1.79 (H) 05/16/2017   CREATININE 1.80 (H) 05/05/2017   CREATININE 1.59 (H) 04/28/2017   Lab Results  Component Value Date   WBC 3.6 (L) 05/16/2017   HGB 8.7 (L) 05/16/2017   HCT 25.7 (L) 05/16/2017   PLT 105 (L) 05/16/2017   GLUCOSE 105 (H) 05/16/2017   ALT 12 (L) 05/16/2017   AST 22 05/16/2017   NA 135 05/16/2017   K 3.5 05/16/2017   CL 103 05/16/2017   CREATININE 1.79 (H) 05/16/2017   BUN 20 05/16/2017   CO2 21 (L) 05/16/2017       PHYSICAL EXAM:  weight is 118 lb 9.6 oz (53.8 kg). Her oral temperature is 98 F (36.7 C). Her blood pressure is 109/53 (abnormal) and her pulse is 85. Her respiration is 16 and oxygen saturation is 100%.    General appearance: alert, cooperative, fatigued, no distress and pale Resp: clear to auscultation bilaterally Back: symmetric, no curvature. ROM normal. No CVA tenderness. Extremities: extremities normal, atraumatic, no cyanosis or edema Neurologic: Grossly  normal   IMPRESSION AND PLAN:  Multiple myeloma- IgA   Patient would like to postpone it Velcade injection cycle 2 day 1. States she feels the best she has ever felt. She would like to continue walking and eating healthy foods. She states her son was recently admitted to the hospital and she has "too much on her plate at this time". She is agreeable to return in one week for reevaluation of cycle 2 day 1 Velcade injection.  Spoke at length with patient regarding why she is feeling better after finishing cycle 1 of Velcade. Her labs have  improved and this is why she is feeling much better. Without treatment, labs will continue to decline. Patient understands and thanks me but wishes to not have treatment today and be reevaluated next week.  Per Dr. Annie Paras last note she will resume Xgeva with cycle 3 of Velcade.  Steroid induced restlessness: Patient has currently quit her Decadron and has not picked up her latest prescription at the pharmacy. At last visit she was persuaded by Dr. [place] to continue with the reduced dose of 4 mg weekly.   Anemia: Hemoglobin has improved with the transfusion of 1 unit packed red blood cells during last visit. Hemoglobin today 8.7.  Rising serum creatinine: 1.79 today. This was noted at last visit at 1.8. This continues to be likely from poor oral intake of fluids. Patient states she drinks what she can tolerate. Patient not agreeable to ultrasound of  kidneys at this time. Encouraged her to push fluids.  Hypokalemia: This is completely resolved. Patient is eating foods rich in potassium as suggested last visit. Potassium 3.5 today.  Hyponatremia: Resolved.  Patient will return in 1 week with repeat labs and to continue weekly subcutaneous Velcade with Decadron.  Greater than 50% was spent in counseling and coordination of care with this patient including but not limited to discussion of the relevant topics above (See A&P) including, but not limited to diagnosis  and management of acute and chronic medical conditions.   Faythe Casa, NP 05/16/2017 10:35 AM

## 2017-05-16 NOTE — Patient Instructions (Signed)
[place] at [facility] Discharge Instructions  RECOMMENDATIONS MADE BY THE CONSULTANT AND ANY TEST RESULTS WILL BE SENT TO YOUR REFERRING PHYSICIAN.  You saw Rulon Abide, NP today. See Amy or Danielle at front desk for follow up appointments.  Thank you for choosing [place] at [facility] to provide your oncology and hematology care.  To afford each patient quality time with our provider, please arrive at least 15 minutes before your scheduled appointment time.    If you have a lab appointment with the [facility] please come in thru the  Main Entrance and check in at the main information desk  You need to re-schedule your appointment should you arrive 10 or more minutes late.  We strive to give you quality time with our providers, and arriving late affects you and other patients whose appointments are after yours.  Also, if you no show three or more times for appointments you may be dismissed from the clinic at the providers discretion.     Again, thank you for choosing [facility].  Our hope is that these requests will decrease the amount of time that you wait before being seen by our physicians.       _____________________________________________________________  Should you have questions after your visit to [facility], please contact our office at [phone] between the hours of 8:30 a.m. and 4:30 p.m.  Voicemails left after 4:30 p.m. will not be returned until the following business day.  For prescription refill requests, have your pharmacy contact our office.       Resources For Cancer Patients and their Caregivers ? American Cancer Society: Can assist with transportation, wigs, general needs, runs Look Good Feel Better.        580-789-7805 ? Cancer Care: Provides financial assistance, online support groups, medication/co-pay assistance.  1-800-813-HOPE (740)621-9024) ? [place] Assists [place] cancer patients and their families through emotional , educational and financial support.  [phone] ? [place] DSS Where to apply for food stamps, Medicaid and utility assistance. 3432392986 ? [employer]: Transportation to medical appointments. (516)839-1360 ? Social Security Administration: May apply for disability if have a Stage IV cancer. (337)081-2908 [phone])339-0978 ? [employer], Disability and Transit Services: Assists with nutrition, care and transit needs. [place] Support Programs: @10RELATIVEDAYS @ > Cancer Support Group  2nd Tuesday of the month 1pm-2pm, Journey Room  > Creative Journey  3rd Tuesday of the month 1130am-1pm, Journey Room  > Look Good Feel Better  1st Wednesday of the month 10am-12 noon, Journey Room (Call [place] to register [phone])

## 2017-05-16 NOTE — Progress Notes (Signed)
Oncology follow up visit. Patient refused treatment today.  Stated she has a full plate and will return next week for Velcade.

## 2017-05-24 ENCOUNTER — Ambulatory Visit (HOSPITAL_COMMUNITY): Payer: Medicare Other | Admitting: Adult Health

## 2017-05-24 ENCOUNTER — Other Ambulatory Visit (HOSPITAL_COMMUNITY): Payer: Medicare Other

## 2017-05-24 ENCOUNTER — Ambulatory Visit (HOSPITAL_COMMUNITY): Payer: Medicare Other

## 2017-05-27 ENCOUNTER — Other Ambulatory Visit: Payer: Self-pay

## 2017-05-27 ENCOUNTER — Emergency Department (HOSPITAL_COMMUNITY)
Admission: EM | Admit: 2017-05-27 | Discharge: 2017-05-27 | Disposition: A | Discharge status: Home / Self Care | Payer: Medicare Other | Attending: Emergency Medicine | Admitting: Emergency Medicine

## 2017-05-27 ENCOUNTER — Encounter (HOSPITAL_COMMUNITY): Payer: Self-pay

## 2017-05-27 DIAGNOSIS — K5903 Drug induced constipation: Secondary | ICD-10-CM | POA: Diagnosis not present

## 2017-05-27 DIAGNOSIS — C9001 Multiple myeloma in remission: Secondary | ICD-10-CM | POA: Diagnosis not present

## 2017-05-27 DIAGNOSIS — R152 Fecal urgency: Secondary | ICD-10-CM | POA: Diagnosis present

## 2017-05-27 DIAGNOSIS — Z79899 Other long term (current) drug therapy: Secondary | ICD-10-CM | POA: Diagnosis not present

## 2017-05-27 DIAGNOSIS — D649 Anemia, unspecified: Secondary | ICD-10-CM

## 2017-05-27 LAB — CBC WITH DIFFERENTIAL/PLATELET
BASOS ABS: 0 10*3/uL (ref 0.0–0.1)
Basophils Relative: 1 %
EOS PCT: 1 %
Eosinophils Absolute: 0 10*3/uL (ref 0.0–0.7)
HCT: 21.2 % — ABNORMAL LOW (ref 36.0–46.0)
HEMOGLOBIN: 7.1 g/dL — AB (ref 12.0–15.0)
LYMPHS PCT: 21 %
Lymphs Abs: 0.9 10*3/uL (ref 0.7–4.0)
MCH: 31.3 pg (ref 26.0–34.0)
MCHC: 33.5 g/dL (ref 30.0–36.0)
MCV: 93.4 fL (ref 78.0–100.0)
Monocytes Absolute: 0.4 10*3/uL (ref 0.1–1.0)
Monocytes Relative: 9 %
NEUTROS PCT: 68 %
Neutro Abs: 2.9 10*3/uL (ref 1.7–7.7)
Platelets: 129 10*3/uL — ABNORMAL LOW (ref 150–400)
RBC: 2.27 MIL/uL — AB (ref 3.87–5.11)
RDW: 16 % — ABNORMAL HIGH (ref 11.5–15.5)
WBC: 4.3 10*3/uL (ref 4.0–10.5)

## 2017-05-27 LAB — BASIC METABOLIC PANEL
Anion gap: 12 (ref 5–15)
BUN: 23 mg/dL — AB (ref 6–20)
CHLORIDE: 101 mmol/L (ref 101–111)
CO2: 20 mmol/L — ABNORMAL LOW (ref 22–32)
CREATININE: 1.78 mg/dL — AB (ref 0.44–1.00)
Calcium: 12.3 mg/dL — ABNORMAL HIGH (ref 8.9–10.3)
GFR, EST AFRICAN AMERICAN: 32 mL/min — AB (ref >60)
GFR, EST NON AFRICAN AMERICAN: 27 mL/min — AB (ref >60)
Glucose, Bld: 96 mg/dL (ref 65–99)
Potassium: 3.3 mmol/L — ABNORMAL LOW (ref 3.5–5.1)
SODIUM: 133 mmol/L — AB (ref 135–145)

## 2017-05-27 MED ORDER — LIDOCAINE HCL 2 % EX GEL
CUTANEOUS | Status: AC
  Filled 2017-05-27: qty 10

## 2017-05-27 MED ORDER — SODIUM CHLORIDE 0.9 % IV BOLUS (SEPSIS)
500.0000 mL | Freq: Once | INTRAVENOUS | Status: AC
  Administered 2017-05-27: 500 mL via INTRAVENOUS

## 2017-05-27 MED ORDER — SODIUM CHLORIDE 0.9 % IV BOLUS (SEPSIS)
500.0000 mL | Freq: Once | INTRAVENOUS | Status: AC
  Administered 2017-05-27: 999 mL via INTRAVENOUS

## 2017-05-27 MED ORDER — ONDANSETRON HCL 4 MG/2ML IJ SOLN
4.0000 mg | Freq: Once | INTRAMUSCULAR | Status: AC
  Administered 2017-05-27: 4 mg via INTRAVENOUS
  Filled 2017-05-27: qty 2

## 2017-05-27 MED ORDER — BISACODYL 10 MG RE SUPP
10.0000 mg | Freq: Once | RECTAL | Status: AC
  Administered 2017-05-27: 10 mg via RECTAL
  Filled 2017-05-27: qty 1

## 2017-05-27 MED ORDER — LIDOCAINE HCL 2 % EX GEL
CUTANEOUS | Status: AC
  Administered 2017-05-27: 10
  Filled 2017-05-27: qty 10

## 2017-05-27 NOTE — ED Triage Notes (Signed)
Pt says has been taking a liquid laxative for the past several weeks and says now is leaking stool.  Pt says now has hemorrhoids and reports decreased urine output for the past few days.  Pt receiving chemo for bone cancer.  Last treatment was approx 2 weeks ago.  Pt says missed her treatment last Tuesday because she had an emergency with her son.

## 2017-05-27 NOTE — ED Notes (Signed)
Patient given mineral oil enema, soft medium size brown stool produced.
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
## 2017-05-27 NOTE — Discharge Instructions (Signed)
Make sure you are getting plenty of rest, and drinking a lot of fluids.  Try to advance her diet, as soon as possible so that you can get some more nutrition in.  Call your oncologist on Monday morning, to arrange for earlier follow-up care.  You may need a blood transfusion in the next week.  Return here, if needed, for problems.

## 2017-05-27 NOTE — ED Notes (Signed)
Pt ambulated with cane and stand by nursing assist with no difficulties. Pt denies dizziness upon walking. Primary RN notified.

## 2017-05-27 NOTE — ED Notes (Signed)
Patient's son states he is leaving and wants to leave his number to call if needed or to pick up patient when discharged. Son's name is Feliberto Harts, number is [phone]

## 2017-05-29 ENCOUNTER — Inpatient Hospital Stay (HOSPITAL_COMMUNITY): Payer: Medicare Other | Attending: Oncology

## 2017-05-29 ENCOUNTER — Other Ambulatory Visit (HOSPITAL_COMMUNITY): Payer: Self-pay | Admitting: Emergency Medicine

## 2017-05-29 DIAGNOSIS — E860 Dehydration: Secondary | ICD-10-CM

## 2017-05-29 DIAGNOSIS — D649 Anemia, unspecified: Secondary | ICD-10-CM

## 2017-05-29 DIAGNOSIS — C9000 Multiple myeloma not having achieved remission: Secondary | ICD-10-CM | POA: Diagnosis present

## 2017-05-29 DIAGNOSIS — D508 Other iron deficiency anemias: Secondary | ICD-10-CM

## 2017-05-29 DIAGNOSIS — R5383 Other fatigue: Secondary | ICD-10-CM

## 2017-05-29 DIAGNOSIS — R531 Weakness: Secondary | ICD-10-CM

## 2017-05-29 LAB — CBC WITH DIFFERENTIAL/PLATELET
BASOS ABS: 0 10*3/uL (ref 0.0–0.1)
BASOS PCT: 0 %
Eosinophils Absolute: 0 10*3/uL (ref 0.0–0.7)
Eosinophils Relative: 0 %
HEMATOCRIT: 22.8 % — AB (ref 36.0–46.0)
HEMOGLOBIN: 7.7 g/dL — AB (ref 12.0–15.0)
Lymphocytes Relative: 18 %
Lymphs Abs: 0.9 10*3/uL (ref 0.7–4.0)
MCH: 32 pg (ref 26.0–34.0)
MCHC: 33.8 g/dL (ref 30.0–36.0)
MCV: 94.6 fL (ref 78.0–100.0)
MONOS PCT: 8 %
Monocytes Absolute: 0.4 10*3/uL (ref 0.1–1.0)
NEUTROS ABS: 3.8 10*3/uL (ref 1.7–7.7)
NEUTROS PCT: 74 %
Platelets: 143 10*3/uL — ABNORMAL LOW (ref 150–400)
RBC: 2.41 MIL/uL — ABNORMAL LOW (ref 3.87–5.11)
RDW: 16 % — ABNORMAL HIGH (ref 11.5–15.5)
WBC: 5.1 10*3/uL (ref 4.0–10.5)

## 2017-05-29 LAB — COMPREHENSIVE METABOLIC PANEL
ALK PHOS: 114 U/L (ref 38–126)
ALT: 15 U/L (ref 14–54)
ANION GAP: 14 (ref 5–15)
AST: 27 U/L (ref 15–41)
Albumin: 2.7 g/dL — ABNORMAL LOW (ref 3.5–5.0)
BUN: 24 mg/dL — ABNORMAL HIGH (ref 6–20)
CALCIUM: 11.7 mg/dL — AB (ref 8.9–10.3)
CO2: 21 mmol/L — AB (ref 22–32)
Chloride: 99 mmol/L — ABNORMAL LOW (ref 101–111)
Creatinine, Ser: 1.85 mg/dL — ABNORMAL HIGH (ref 0.44–1.00)
GFR calc Af Amer: 30 mL/min — ABNORMAL LOW (ref >60)
GFR calc non Af Amer: 26 mL/min — ABNORMAL LOW (ref >60)
Glucose, Bld: 107 mg/dL — ABNORMAL HIGH (ref 65–99)
POTASSIUM: 3.4 mmol/L — AB (ref 3.5–5.1)
SODIUM: 134 mmol/L — AB (ref 135–145)
Total Bilirubin: 0.6 mg/dL (ref 0.3–1.2)
Total Protein: 7.8 g/dL (ref 6.5–8.1)

## 2017-05-29 LAB — LACTATE DEHYDROGENASE: LDH: 132 U/L (ref 98–192)

## 2017-05-29 LAB — PREPARE RBC (CROSSMATCH)

## 2017-05-29 NOTE — Progress Notes (Unsigned)
Pt called and when she was in the ER few days ago her hemoglobin was 7.1.  Her heart is skipping beats.  She wants to have a transfusion soon.  Explained she could come in for blood work today and we could get her set up for transfusion in the am.  Pt scheduled for blood work 2:20pm today.  2 units of PRBCs on 05/30/2017 at 9:30 am.  Pt verbalized understanding.  Dr Walden Field notified and orders placed.

## 2017-05-30 ENCOUNTER — Inpatient Hospital Stay (HOSPITAL_COMMUNITY): Payer: Medicare Other

## 2017-05-30 ENCOUNTER — Encounter (HOSPITAL_COMMUNITY): Payer: Self-pay

## 2017-05-30 DIAGNOSIS — D649 Anemia, unspecified: Secondary | ICD-10-CM

## 2017-05-30 MED ORDER — DIPHENHYDRAMINE HCL 25 MG PO CAPS
ORAL_CAPSULE | ORAL | Status: AC
  Filled 2017-05-30: qty 1

## 2017-05-30 MED ORDER — DIPHENHYDRAMINE HCL 25 MG PO CAPS
25.0000 mg | ORAL_CAPSULE | Freq: Once | ORAL | Status: AC
  Administered 2017-05-30: 25 mg via ORAL

## 2017-05-30 MED ORDER — ACETAMINOPHEN 325 MG PO TABS
ORAL_TABLET | ORAL | Status: AC
  Filled 2017-05-30: qty 2

## 2017-05-30 MED ORDER — SODIUM CHLORIDE 0.9% FLUSH
10.0000 mL | INTRAVENOUS | Status: AC | PRN
  Administered 2017-05-30: 10 mL

## 2017-05-30 MED ORDER — SODIUM CHLORIDE 0.9 % IV SOLN
250.0000 mL | Freq: Once | INTRAVENOUS | Status: AC
  Administered 2017-05-30: 250 mL via INTRAVENOUS

## 2017-05-30 MED ORDER — ACETAMINOPHEN 325 MG PO TABS
650.0000 mg | ORAL_TABLET | Freq: Once | ORAL | Status: AC
  Administered 2017-05-30: 650 mg via ORAL

## 2017-05-30 NOTE — Patient Instructions (Signed)
[facility] at [facility] Discharge Instructions  Received 2 units of PRBC's today. Follow-up as scheduled. Call clinic for any questions or concerns   Thank you for choosing [facility] at [facility] to provide your oncology and hematology care.  To afford each patient quality time with our provider, please arrive at least 15 minutes before your scheduled appointment time.   If you have a lab appointment with the Cancer Center please come in thru the  Main Entrance and check in at the main information desk  You need to re-schedule your appointment should you arrive 10 or more minutes late.  We strive to give you quality time with our providers, and arriving late affects you and other patients whose appointments are after yours.  Also, if you no show three or more times for appointments you may be dismissed from the clinic at the providers discretion.     Again, thank you for choosing [facility].  Our hope is that these requests will decrease the amount of time that you wait before being seen by our physicians.       _____________________________________________________________  Should you have questions after your visit to [facility], please contact our office at [phone] between the hours of 8:30 a.m. and 4:30 p.m.  Voicemails left after 4:30 p.m. will not be returned until the following business day.  For prescription refill requests, have your pharmacy contact our office.       Resources For Cancer Patients and their Caregivers ? American Cancer Society: Can assist with transportation, wigs, general needs, runs Look Good Feel Better.        1-888-227-6333 ? Cancer Care: Provides financial assistance, online support groups, medication/co-pay assistance.  1-800-813-HOPE (4673) ? [facility] Assists [place] cancer patients and their families through emotional , educational and  financial support.  336-427-4357 ? [place] DSS Where to apply for food stamps, Medicaid and utility assistance. 336-342-1394 ? [employer]: Transportation to medical appointments. 336-347-2287 ? Social Security Administration: May apply for disability if have a Stage IV cancer. 336-342-7796 1-800-772-1213 ? [place] Aging, Disability and Transit Services: Assists with nutrition, care and transit needs. 336-349-2343  Cancer Center Support Programs:   > Cancer Support Group  2nd Tuesday of the month 1pm-2pm, Journey Room   > Creative Journey  3rd Tuesday of the month 1130am-1pm, Journey Room    

## 2017-05-30 NOTE — Progress Notes (Signed)
1115 1st unit of blood started. Unable to scan barcode. Verified by Cleta Alberts RN and Anastasio Champion RN                                                                  Unit Number [id number] V     A  Pos                BB armband   R [id number]    Leukrd                                Rate 120 ml/hr    Volume 30 ml                                                                                                                           1130 Rate 225 ml/hr    Volume   285 ml                                                                                                               1255 1st unit of blood completed                                                                                                                          Gabriela Eves tolerated blood transfusion with 2 units of PRBC's given well without complaints or incident. VSS prior to,during and after each unit of blood given. Pt discharged via wheelchair in satisfactory condition accompanied by family member

## 2017-05-31 LAB — BPAM RBC
BLOOD PRODUCT EXPIRATION DATE: 201904022359
Blood Product Expiration Date: 201903202359
ISSUE DATE / TIME: 201903051053
ISSUE DATE / TIME: 201903060003
UNIT TYPE AND RH: 1700
UNIT TYPE AND RH: 6200

## 2017-05-31 LAB — TYPE AND SCREEN
ABO/RH(D): A POS
Antibody Screen: POSITIVE
UNIT DIVISION: 0
UNIT DIVISION: 0

## 2017-06-01 ENCOUNTER — Ambulatory Visit (HOSPITAL_COMMUNITY): Payer: Medicare Other | Admitting: Internal Medicine

## 2017-06-01 ENCOUNTER — Ambulatory Visit (HOSPITAL_COMMUNITY): Payer: Medicare Other

## 2017-06-01 ENCOUNTER — Other Ambulatory Visit (HOSPITAL_COMMUNITY): Payer: Medicare Other

## 2017-06-06 ENCOUNTER — Other Ambulatory Visit: Payer: Self-pay

## 2017-06-06 ENCOUNTER — Encounter (HOSPITAL_COMMUNITY): Payer: Self-pay

## 2017-06-06 ENCOUNTER — Inpatient Hospital Stay (HOSPITAL_COMMUNITY): Payer: Medicare Other

## 2017-06-06 ENCOUNTER — Inpatient Hospital Stay (HOSPITAL_COMMUNITY): Payer: Medicare Other | Admitting: Internal Medicine

## 2017-06-06 VITALS — BP 94/48 | HR 100 | Temp 98.3°F | Resp 16 | Wt 111.8 lb

## 2017-06-06 DIAGNOSIS — D649 Anemia, unspecified: Secondary | ICD-10-CM

## 2017-06-06 DIAGNOSIS — C9000 Multiple myeloma not having achieved remission: Secondary | ICD-10-CM

## 2017-06-06 LAB — CBC WITH DIFFERENTIAL/PLATELET
BASOS ABS: 0 10*3/uL (ref 0.0–0.1)
Basophils Relative: 0 %
EOS PCT: 1 %
Eosinophils Absolute: 0 10*3/uL (ref 0.0–0.7)
HCT: 30.3 % — ABNORMAL LOW (ref 36.0–46.0)
Hemoglobin: 10 g/dL — ABNORMAL LOW (ref 12.0–15.0)
LYMPHS PCT: 17 %
Lymphs Abs: 1 10*3/uL (ref 0.7–4.0)
MCH: 30.7 pg (ref 26.0–34.0)
MCHC: 33 g/dL (ref 30.0–36.0)
MCV: 92.9 fL (ref 78.0–100.0)
MONO ABS: 0.6 10*3/uL (ref 0.1–1.0)
MONOS PCT: 10 %
Neutro Abs: 4.2 10*3/uL (ref 1.7–7.7)
Neutrophils Relative %: 72 %
PLATELETS: 121 10*3/uL — AB (ref 150–400)
RBC: 3.26 MIL/uL — ABNORMAL LOW (ref 3.87–5.11)
RDW: 15.7 % — AB (ref 11.5–15.5)
WBC: 5.8 10*3/uL (ref 4.0–10.5)

## 2017-06-06 LAB — COMPREHENSIVE METABOLIC PANEL
ALT: 15 U/L (ref 14–54)
ANION GAP: 10 (ref 5–15)
AST: 32 U/L (ref 15–41)
Albumin: 2.5 g/dL — ABNORMAL LOW (ref 3.5–5.0)
Alkaline Phosphatase: 89 U/L (ref 38–126)
BUN: 23 mg/dL — ABNORMAL HIGH (ref 6–20)
CO2: 27 mmol/L (ref 22–32)
Calcium: 14 mg/dL (ref 8.9–10.3)
Chloride: 95 mmol/L — ABNORMAL LOW (ref 101–111)
Creatinine, Ser: 1.26 mg/dL — ABNORMAL HIGH (ref 0.44–1.00)
GFR, EST AFRICAN AMERICAN: 48 mL/min — AB (ref >60)
GFR, EST NON AFRICAN AMERICAN: 42 mL/min — AB (ref >60)
Glucose, Bld: 121 mg/dL — ABNORMAL HIGH (ref 65–99)
POTASSIUM: 3.2 mmol/L — AB (ref 3.5–5.1)
Sodium: 132 mmol/L — ABNORMAL LOW (ref 135–145)
Total Bilirubin: 0.6 mg/dL (ref 0.3–1.2)
Total Protein: 7.9 g/dL (ref 6.5–8.1)

## 2017-06-06 LAB — LACTATE DEHYDROGENASE: LDH: 125 U/L (ref 98–192)

## 2017-06-06 MED ORDER — SODIUM CHLORIDE 0.9 % IV SOLN
INTRAVENOUS | Status: DC
  Administered 2017-06-06: 15:00:00 via INTRAVENOUS

## 2017-06-06 MED ORDER — CYANOCOBALAMIN 1000 MCG/ML IJ SOLN
INTRAMUSCULAR | Status: AC
  Filled 2017-06-06: qty 1

## 2017-06-06 MED ORDER — ZOLEDRONIC ACID 4 MG/5ML IV CONC
4.0000 mg | Freq: Once | INTRAVENOUS | Status: AC
  Administered 2017-06-06: 4 mg via INTRAVENOUS
  Filled 2017-06-06: qty 5

## 2017-06-06 NOTE — Patient Instructions (Signed)
[place] at [facility] Discharge Instructions  zometa and fluids today.  Labs Friday and wait for results.   Thank you for choosing [place] at [facility] to provide your oncology and hematology care.  To afford each patient quality time with our provider, please arrive at least 15 minutes before your scheduled appointment time.   If you have a lab appointment with the [place] please come in thru the  Main Entrance and check in at the main information desk  You need to re-schedule your appointment should you arrive 10 or more minutes late.  We strive to give you quality time with our providers, and arriving late affects you and other patients whose appointments are after yours.  Also, if you no show three or more times for appointments you may be dismissed from the clinic at the providers discretion.     Again, thank you for choosing [facility].  Our hope is that these requests will decrease the amount of time that you wait before being seen by our physicians.       _____________________________________________________________  Should you have questions after your visit to [facility], please contact our office at [phone] between the hours of 8:30 a.m. and 4:30 p.m.  Voicemails left after 4:30 p.m. will not be returned until the following business day.  For prescription refill requests, have your pharmacy contact our office.       Resources For Cancer Patients and their Caregivers ? American Cancer Society: Can assist with transportation, wigs, general needs, runs Look Good Feel Better.        (267) 571-7766 ? Cancer Care: Provides financial assistance, online support groups, medication/co-pay assistance.  1-800-813-HOPE (904) 156-1449) ? [facility] Assists [place] cancer patients and their families through emotional , educational and financial support.   470-252-5164 ? [place] DSS Where to apply for food stamps, Medicaid and utility assistance. 623-320-6738 ? [facility]: Transportation to medical appointments. (919) 731-6297 ? Social Security Administration: May apply for disability if have a Stage IV cancer. 312-764-5979 681-582-9587 ? [employer], Disability and Transit Services: Assists with nutrition, care and transit needs. [place] Support Programs:   > Cancer Support Group  2nd Tuesday of the month 1pm-2pm, Journey Room   > Creative Journey  3rd Tuesday of the month 1130am-1pm, Journey Room

## 2017-06-06 NOTE — Progress Notes (Signed)
CRITICAL VALUE ALERT Critical value received:  Calcium 14.0  Date of notification:  06-06-2017 Time of notification: 3073 Critical value read back:  Yes.   Nurse who received alert:  M.Barnes LPN MD notified (1st Amaia Lavallie):  Dr. Walden Field @1500    Will hold velcade today per Dr. Walden Field. Will give 57ml bolus of saline, and zometa per orders.  Appointment made for patient to have labs redrawn Friday.

## 2017-06-07 LAB — CALCIUM, IONIZED: Calcium, Ionized, Serum: 8.1 mg/dL — ABNORMAL HIGH (ref 4.5–5.6)

## 2017-06-09 ENCOUNTER — Inpatient Hospital Stay (HOSPITAL_COMMUNITY): Payer: Medicare Other

## 2017-06-09 DIAGNOSIS — D649 Anemia, unspecified: Secondary | ICD-10-CM | POA: Diagnosis not present

## 2017-06-09 DIAGNOSIS — C9000 Multiple myeloma not having achieved remission: Secondary | ICD-10-CM

## 2017-06-09 LAB — CBC WITH DIFFERENTIAL/PLATELET
BASOS ABS: 0 10*3/uL (ref 0.0–0.1)
BASOS PCT: 0 %
Eosinophils Absolute: 0 10*3/uL (ref 0.0–0.7)
Eosinophils Relative: 1 %
HEMATOCRIT: 29.4 % — AB (ref 36.0–46.0)
HEMOGLOBIN: 9.6 g/dL — AB (ref 12.0–15.0)
Lymphocytes Relative: 17 %
Lymphs Abs: 1 10*3/uL (ref 0.7–4.0)
MCH: 30.4 pg (ref 26.0–34.0)
MCHC: 32.7 g/dL (ref 30.0–36.0)
MCV: 93 fL (ref 78.0–100.0)
Monocytes Absolute: 0.4 10*3/uL (ref 0.1–1.0)
Monocytes Relative: 7 %
NEUTROS ABS: 4.2 10*3/uL (ref 1.7–7.7)
Neutrophils Relative %: 75 %
Platelets: 123 10*3/uL — ABNORMAL LOW (ref 150–400)
RBC: 3.16 MIL/uL — ABNORMAL LOW (ref 3.87–5.11)
RDW: 15.6 % — AB (ref 11.5–15.5)
WBC: 5.6 10*3/uL (ref 4.0–10.5)

## 2017-06-09 LAB — COMPREHENSIVE METABOLIC PANEL
ALBUMIN: 2.5 g/dL — AB (ref 3.5–5.0)
ALK PHOS: 86 U/L (ref 38–126)
ALT: 17 U/L (ref 14–54)
AST: 35 U/L (ref 15–41)
Anion gap: 12 (ref 5–15)
BILIRUBIN TOTAL: 0.7 mg/dL (ref 0.3–1.2)
BUN: 23 mg/dL — ABNORMAL HIGH (ref 6–20)
CO2: 24 mmol/L (ref 22–32)
Calcium: 12.3 mg/dL — ABNORMAL HIGH (ref 8.9–10.3)
Chloride: 96 mmol/L — ABNORMAL LOW (ref 101–111)
Creatinine, Ser: 1.2 mg/dL — ABNORMAL HIGH (ref 0.44–1.00)
GFR calc Af Amer: 51 mL/min — ABNORMAL LOW (ref >60)
GFR calc non Af Amer: 44 mL/min — ABNORMAL LOW (ref >60)
GLUCOSE: 118 mg/dL — AB (ref 65–99)
POTASSIUM: 3.3 mmol/L — AB (ref 3.5–5.1)
Sodium: 132 mmol/L — ABNORMAL LOW (ref 135–145)
TOTAL PROTEIN: 7.8 g/dL (ref 6.5–8.1)

## 2017-06-12 ENCOUNTER — Other Ambulatory Visit: Payer: Self-pay

## 2017-06-12 ENCOUNTER — Observation Stay (HOSPITAL_COMMUNITY): Payer: Medicare Other

## 2017-06-12 ENCOUNTER — Inpatient Hospital Stay (HOSPITAL_COMMUNITY)
Admission: EM | Admit: 2017-06-12 | Discharge: 2017-06-17 | DRG: 393 | Disposition: A | Discharge status: Home / Self Care | Payer: Medicare Other | Attending: Internal Medicine | Admitting: Internal Medicine

## 2017-06-12 ENCOUNTER — Encounter (HOSPITAL_COMMUNITY): Payer: Self-pay

## 2017-06-12 ENCOUNTER — Emergency Department (HOSPITAL_COMMUNITY): Payer: Medicare Other

## 2017-06-12 DIAGNOSIS — K5900 Constipation, unspecified: Secondary | ICD-10-CM

## 2017-06-12 DIAGNOSIS — R1310 Dysphagia, unspecified: Secondary | ICD-10-CM | POA: Diagnosis present

## 2017-06-12 DIAGNOSIS — N824 Other female intestinal-genital tract fistulae: Secondary | ICD-10-CM

## 2017-06-12 DIAGNOSIS — D696 Thrombocytopenia, unspecified: Secondary | ICD-10-CM | POA: Diagnosis present

## 2017-06-12 DIAGNOSIS — R339 Retention of urine, unspecified: Secondary | ICD-10-CM | POA: Diagnosis present

## 2017-06-12 DIAGNOSIS — K5903 Drug induced constipation: Secondary | ICD-10-CM

## 2017-06-12 DIAGNOSIS — N823 Fistula of vagina to large intestine: Secondary | ICD-10-CM | POA: Diagnosis not present

## 2017-06-12 DIAGNOSIS — K5730 Diverticulosis of large intestine without perforation or abscess without bleeding: Secondary | ICD-10-CM | POA: Diagnosis present

## 2017-06-12 DIAGNOSIS — T474X5A Adverse effect of other laxatives, initial encounter: Secondary | ICD-10-CM | POA: Diagnosis not present

## 2017-06-12 DIAGNOSIS — K6289 Other specified diseases of anus and rectum: Secondary | ICD-10-CM | POA: Diagnosis present

## 2017-06-12 DIAGNOSIS — N952 Postmenopausal atrophic vaginitis: Secondary | ICD-10-CM | POA: Diagnosis present

## 2017-06-12 DIAGNOSIS — K649 Unspecified hemorrhoids: Secondary | ICD-10-CM | POA: Diagnosis present

## 2017-06-12 DIAGNOSIS — Z66 Do not resuscitate: Secondary | ICD-10-CM | POA: Diagnosis present

## 2017-06-12 DIAGNOSIS — N183 Chronic kidney disease, stage 3 unspecified: Secondary | ICD-10-CM

## 2017-06-12 DIAGNOSIS — E43 Unspecified severe protein-calorie malnutrition: Secondary | ICD-10-CM | POA: Diagnosis present

## 2017-06-12 DIAGNOSIS — K5641 Fecal impaction: Secondary | ICD-10-CM | POA: Diagnosis not present

## 2017-06-12 DIAGNOSIS — Z91048 Other nonmedicinal substance allergy status: Secondary | ICD-10-CM

## 2017-06-12 DIAGNOSIS — K921 Melena: Secondary | ICD-10-CM

## 2017-06-12 DIAGNOSIS — Z681 Body mass index (BMI) 19 or less, adult: Secondary | ICD-10-CM | POA: Diagnosis not present

## 2017-06-12 DIAGNOSIS — C9000 Multiple myeloma not having achieved remission: Secondary | ICD-10-CM | POA: Diagnosis present

## 2017-06-12 DIAGNOSIS — L989 Disorder of the skin and subcutaneous tissue, unspecified: Secondary | ICD-10-CM

## 2017-06-12 DIAGNOSIS — D630 Anemia in neoplastic disease: Secondary | ICD-10-CM | POA: Diagnosis present

## 2017-06-12 DIAGNOSIS — Z79899 Other long term (current) drug therapy: Secondary | ICD-10-CM

## 2017-06-12 DIAGNOSIS — Z888 Allergy status to other drugs, medicaments and biological substances status: Secondary | ICD-10-CM

## 2017-06-12 DIAGNOSIS — I444 Left anterior fascicular block: Secondary | ICD-10-CM | POA: Diagnosis not present

## 2017-06-12 DIAGNOSIS — K449 Diaphragmatic hernia without obstruction or gangrene: Secondary | ICD-10-CM | POA: Diagnosis present

## 2017-06-12 DIAGNOSIS — E876 Hypokalemia: Secondary | ICD-10-CM | POA: Diagnosis not present

## 2017-06-12 LAB — CBC WITH DIFFERENTIAL/PLATELET
Basophils Absolute: 0 10*3/uL (ref 0.0–0.1)
Basophils Relative: 0 %
EOS ABS: 0 10*3/uL (ref 0.0–0.7)
EOS PCT: 0 %
HCT: 27.9 % — ABNORMAL LOW (ref 36.0–46.0)
Hemoglobin: 9.2 g/dL — ABNORMAL LOW (ref 12.0–15.0)
LYMPHS PCT: 12 %
Lymphs Abs: 0.6 10*3/uL — ABNORMAL LOW (ref 0.7–4.0)
MCH: 30.7 pg (ref 26.0–34.0)
MCHC: 33 g/dL (ref 30.0–36.0)
MCV: 93 fL (ref 78.0–100.0)
MONO ABS: 0.4 10*3/uL (ref 0.1–1.0)
Monocytes Relative: 7 %
Neutro Abs: 4.2 10*3/uL (ref 1.7–7.7)
Neutrophils Relative %: 81 %
PLATELETS: 124 10*3/uL — AB (ref 150–400)
RBC: 3 MIL/uL — ABNORMAL LOW (ref 3.87–5.11)
RDW: 15.7 % — AB (ref 11.5–15.5)
WBC: 5.2 10*3/uL (ref 4.0–10.5)

## 2017-06-12 LAB — URINALYSIS, ROUTINE W REFLEX MICROSCOPIC
BILIRUBIN URINE: NEGATIVE
Glucose, UA: NEGATIVE mg/dL
Ketones, ur: NEGATIVE mg/dL
Nitrite: NEGATIVE
PH: 6 (ref 5.0–8.0)
Protein, ur: NEGATIVE mg/dL
SPECIFIC GRAVITY, URINE: 1.01 (ref 1.005–1.030)
SQUAMOUS EPITHELIAL / LPF: NONE SEEN

## 2017-06-12 LAB — COMPREHENSIVE METABOLIC PANEL
ALBUMIN: 2.5 g/dL — AB (ref 3.5–5.0)
ALK PHOS: 97 U/L (ref 38–126)
ALT: 17 U/L (ref 14–54)
AST: 27 U/L (ref 15–41)
Anion gap: 12 (ref 5–15)
BILIRUBIN TOTAL: 0.7 mg/dL (ref 0.3–1.2)
BUN: 28 mg/dL — AB (ref 6–20)
CALCIUM: 12.1 mg/dL — AB (ref 8.9–10.3)
CO2: 24 mmol/L (ref 22–32)
CREATININE: 1.57 mg/dL — AB (ref 0.44–1.00)
Chloride: 96 mmol/L — ABNORMAL LOW (ref 101–111)
GFR calc Af Amer: 37 mL/min — ABNORMAL LOW (ref >60)
GFR calc non Af Amer: 32 mL/min — ABNORMAL LOW (ref >60)
GLUCOSE: 107 mg/dL — AB (ref 65–99)
POTASSIUM: 3.4 mmol/L — AB (ref 3.5–5.1)
Sodium: 132 mmol/L — ABNORMAL LOW (ref 135–145)
Total Protein: 7.9 g/dL (ref 6.5–8.1)

## 2017-06-12 MED ORDER — ACETAMINOPHEN 325 MG PO TABS
650.0000 mg | ORAL_TABLET | Freq: Four times a day (QID) | ORAL | Status: DC | PRN
  Administered 2017-06-13 – 2017-06-14 (×3): 650 mg via ORAL
  Filled 2017-06-12 (×3): qty 2

## 2017-06-12 MED ORDER — ONDANSETRON HCL 4 MG/2ML IJ SOLN: 4.0000 mg | Freq: Four times a day (QID) | INTRAMUSCULAR | Status: DC | PRN

## 2017-06-12 MED ORDER — SODIUM CHLORIDE 0.9 % IV SOLN
INTRAVENOUS | Status: DC
  Administered 2017-06-12 – 2017-06-13 (×2): via INTRAVENOUS

## 2017-06-12 MED ORDER — LIDOCAINE VISCOUS 2 % MT SOLN
15.0000 mL | Freq: Once | OROMUCOSAL | Status: AC
  Administered 2017-06-12: 15 mL via OROMUCOSAL
  Filled 2017-06-12: qty 15

## 2017-06-12 MED ORDER — SODIUM CHLORIDE 0.9 % IV BOLUS (SEPSIS)
500.0000 mL | Freq: Once | INTRAVENOUS | Status: AC
  Administered 2017-06-12: 500 mL via INTRAVENOUS

## 2017-06-12 MED ORDER — POTASSIUM CHLORIDE CRYS ER 20 MEQ PO TBCR
60.0000 meq | EXTENDED_RELEASE_TABLET | Freq: Once | ORAL | Status: AC
  Administered 2017-06-12: 60 meq via ORAL
  Filled 2017-06-12: qty 3

## 2017-06-12 MED ORDER — MAGNESIUM CITRATE PO SOLN
1.0000 | Freq: Once | ORAL | Status: DC
  Filled 2017-06-12: qty 296

## 2017-06-12 MED ORDER — FENTANYL CITRATE (PF) 100 MCG/2ML IJ SOLN
50.0000 ug | Freq: Once | INTRAMUSCULAR | Status: AC
  Administered 2017-06-12: 50 ug via INTRAVENOUS
  Filled 2017-06-12: qty 2

## 2017-06-12 MED ORDER — ACETAMINOPHEN 650 MG RE SUPP: 650.0000 mg | Freq: Four times a day (QID) | RECTAL | Status: DC | PRN

## 2017-06-12 MED ORDER — ONDANSETRON HCL 4 MG PO TABS: 4.0000 mg | ORAL_TABLET | Freq: Four times a day (QID) | ORAL | Status: DC | PRN
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
## 2017-06-12 NOTE — ED Triage Notes (Signed)
Pt has hemorrhoid pain that is a 9/10. Pt has taken a Tylenol this morning with no relief. Other than this, pt has no complaints.
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
## 2017-06-13 ENCOUNTER — Encounter (HOSPITAL_COMMUNITY): Payer: Self-pay | Admitting: Gastroenterology

## 2017-06-13 ENCOUNTER — Inpatient Hospital Stay (HOSPITAL_COMMUNITY): Payer: Medicare Other

## 2017-06-13 ENCOUNTER — Ambulatory Visit (HOSPITAL_COMMUNITY): Payer: Medicare Other

## 2017-06-13 ENCOUNTER — Other Ambulatory Visit (HOSPITAL_COMMUNITY): Payer: Medicare Other

## 2017-06-13 ENCOUNTER — Observation Stay (HOSPITAL_COMMUNITY): Payer: Medicare Other

## 2017-06-13 DIAGNOSIS — E876 Hypokalemia: Secondary | ICD-10-CM | POA: Diagnosis not present

## 2017-06-13 DIAGNOSIS — Z66 Do not resuscitate: Secondary | ICD-10-CM | POA: Diagnosis present

## 2017-06-13 DIAGNOSIS — K5641 Fecal impaction: Secondary | ICD-10-CM

## 2017-06-13 DIAGNOSIS — K5730 Diverticulosis of large intestine without perforation or abscess without bleeding: Secondary | ICD-10-CM | POA: Diagnosis present

## 2017-06-13 DIAGNOSIS — N952 Postmenopausal atrophic vaginitis: Secondary | ICD-10-CM | POA: Diagnosis present

## 2017-06-13 DIAGNOSIS — K921 Melena: Secondary | ICD-10-CM | POA: Diagnosis not present

## 2017-06-13 DIAGNOSIS — K449 Diaphragmatic hernia without obstruction or gangrene: Secondary | ICD-10-CM | POA: Diagnosis present

## 2017-06-13 DIAGNOSIS — L989 Disorder of the skin and subcutaneous tissue, unspecified: Secondary | ICD-10-CM | POA: Diagnosis not present

## 2017-06-13 DIAGNOSIS — K5900 Constipation, unspecified: Secondary | ICD-10-CM

## 2017-06-13 DIAGNOSIS — K625 Hemorrhage of anus and rectum: Secondary | ICD-10-CM | POA: Diagnosis not present

## 2017-06-13 DIAGNOSIS — E43 Unspecified severe protein-calorie malnutrition: Secondary | ICD-10-CM | POA: Diagnosis present

## 2017-06-13 DIAGNOSIS — N183 Chronic kidney disease, stage 3 (moderate): Secondary | ICD-10-CM | POA: Diagnosis not present

## 2017-06-13 DIAGNOSIS — N824 Other female intestinal-genital tract fistulae: Secondary | ICD-10-CM

## 2017-06-13 DIAGNOSIS — D696 Thrombocytopenia, unspecified: Secondary | ICD-10-CM | POA: Diagnosis present

## 2017-06-13 DIAGNOSIS — D630 Anemia in neoplastic disease: Secondary | ICD-10-CM | POA: Diagnosis present

## 2017-06-13 DIAGNOSIS — D398 Neoplasm of uncertain behavior of other specified female genital organs: Secondary | ICD-10-CM | POA: Diagnosis not present

## 2017-06-13 DIAGNOSIS — K649 Unspecified hemorrhoids: Secondary | ICD-10-CM | POA: Diagnosis present

## 2017-06-13 DIAGNOSIS — Z888 Allergy status to other drugs, medicaments and biological substances status: Secondary | ICD-10-CM | POA: Diagnosis not present

## 2017-06-13 DIAGNOSIS — K6289 Other specified diseases of anus and rectum: Secondary | ICD-10-CM | POA: Diagnosis present

## 2017-06-13 DIAGNOSIS — R933 Abnormal findings on diagnostic imaging of other parts of digestive tract: Secondary | ICD-10-CM | POA: Diagnosis not present

## 2017-06-13 DIAGNOSIS — Z91048 Other nonmedicinal substance allergy status: Secondary | ICD-10-CM | POA: Diagnosis not present

## 2017-06-13 DIAGNOSIS — Z681 Body mass index (BMI) 19 or less, adult: Secondary | ICD-10-CM | POA: Diagnosis not present

## 2017-06-13 DIAGNOSIS — N823 Fistula of vagina to large intestine: Secondary | ICD-10-CM | POA: Diagnosis not present

## 2017-06-13 DIAGNOSIS — R339 Retention of urine, unspecified: Secondary | ICD-10-CM | POA: Diagnosis present

## 2017-06-13 DIAGNOSIS — D375 Neoplasm of uncertain behavior of rectum: Secondary | ICD-10-CM | POA: Diagnosis not present

## 2017-06-13 DIAGNOSIS — C9000 Multiple myeloma not having achieved remission: Secondary | ICD-10-CM | POA: Diagnosis not present

## 2017-06-13 DIAGNOSIS — R159 Full incontinence of feces: Secondary | ICD-10-CM | POA: Diagnosis not present

## 2017-06-13 DIAGNOSIS — Z79899 Other long term (current) drug therapy: Secondary | ICD-10-CM | POA: Diagnosis not present

## 2017-06-13 DIAGNOSIS — R1310 Dysphagia, unspecified: Secondary | ICD-10-CM | POA: Diagnosis not present

## 2017-06-13 DIAGNOSIS — L988 Other specified disorders of the skin and subcutaneous tissue: Secondary | ICD-10-CM | POA: Diagnosis not present

## 2017-06-13 LAB — CBC WITH DIFFERENTIAL/PLATELET
BASOS PCT: 0 %
Basophils Absolute: 0 10*3/uL (ref 0.0–0.1)
EOS ABS: 0 10*3/uL (ref 0.0–0.7)
Eosinophils Relative: 1 %
HCT: 24.5 % — ABNORMAL LOW (ref 36.0–46.0)
Hemoglobin: 8.1 g/dL — ABNORMAL LOW (ref 12.0–15.0)
Lymphocytes Relative: 15 %
Lymphs Abs: 0.7 10*3/uL (ref 0.7–4.0)
MCH: 31.2 pg (ref 26.0–34.0)
MCHC: 33.1 g/dL (ref 30.0–36.0)
MCV: 94.2 fL (ref 78.0–100.0)
MONOS PCT: 9 %
Monocytes Absolute: 0.4 10*3/uL (ref 0.1–1.0)
Neutro Abs: 3.3 10*3/uL (ref 1.7–7.7)
Neutrophils Relative %: 75 %
Platelets: 110 10*3/uL — ABNORMAL LOW (ref 150–400)
RBC: 2.6 MIL/uL — ABNORMAL LOW (ref 3.87–5.11)
RDW: 15.9 % — AB (ref 11.5–15.5)
WBC: 4.5 10*3/uL (ref 4.0–10.5)

## 2017-06-13 LAB — COMPREHENSIVE METABOLIC PANEL
ALT: 13 U/L — ABNORMAL LOW (ref 14–54)
AST: 22 U/L (ref 15–41)
Albumin: 2 g/dL — ABNORMAL LOW (ref 3.5–5.0)
Alkaline Phosphatase: 84 U/L (ref 38–126)
Anion gap: 8 (ref 5–15)
BILIRUBIN TOTAL: 0.6 mg/dL (ref 0.3–1.2)
BUN: 26 mg/dL — ABNORMAL HIGH (ref 6–20)
CO2: 24 mmol/L (ref 22–32)
Calcium: 11.6 mg/dL — ABNORMAL HIGH (ref 8.9–10.3)
Chloride: 105 mmol/L (ref 101–111)
Creatinine, Ser: 1.47 mg/dL — ABNORMAL HIGH (ref 0.44–1.00)
GFR, EST AFRICAN AMERICAN: 40 mL/min — AB (ref >60)
GFR, EST NON AFRICAN AMERICAN: 34 mL/min — AB (ref >60)
Glucose, Bld: 92 mg/dL (ref 65–99)
POTASSIUM: 4.1 mmol/L (ref 3.5–5.1)
Sodium: 137 mmol/L (ref 135–145)
TOTAL PROTEIN: 6.8 g/dL (ref 6.5–8.1)

## 2017-06-13 LAB — OCCULT BLOOD X 1 CARD TO LAB, STOOL: Fecal Occult Bld: POSITIVE — AB

## 2017-06-13 LAB — HEMOGLOBIN AND HEMATOCRIT, BLOOD
HEMATOCRIT: 25.8 % — AB (ref 36.0–46.0)
HEMOGLOBIN: 8.4 g/dL — AB (ref 12.0–15.0)

## 2017-06-13 MED ORDER — ENSURE ENLIVE PO LIQD
237.0000 mL | Freq: Four times a day (QID) | ORAL | Status: DC
  Administered 2017-06-13 – 2017-06-15 (×4): 237 mL via ORAL
  Filled 2017-06-13 (×8): qty 237

## 2017-06-13 MED ORDER — MAGNESIUM CITRATE PO SOLN
1.0000 | Freq: Once | ORAL | Status: AC
  Administered 2017-06-13: 1 via ORAL
  Filled 2017-06-13: qty 296

## 2017-06-13 MED ORDER — POTASSIUM CHLORIDE IN NACL 20-0.9 MEQ/L-% IV SOLN
INTRAVENOUS | Status: DC
Start: 2017-06-13 — End: 2017-06-18
  Administered 2017-06-13 – 2017-06-16 (×5): via INTRAVENOUS

## 2017-06-13 MED ORDER — POLYETHYLENE GLYCOL 3350 17 G PO PACK
17.0000 g | PACK | Freq: Three times a day (TID) | ORAL | Status: DC
  Filled 2017-06-13 (×2): qty 1

## 2017-06-13 MED ORDER — PANTOPRAZOLE SODIUM 40 MG PO TBEC
40.0000 mg | DELAYED_RELEASE_TABLET | Freq: Every day | ORAL | Status: DC
  Administered 2017-06-13: 40 mg via ORAL
  Filled 2017-06-13 (×4): qty 1

## 2017-06-13 MED ORDER — ZOLEDRONIC ACID 4 MG/5ML IV CONC
4.0000 mg | Freq: Once | INTRAVENOUS | Status: AC
  Administered 2017-06-13: 4 mg via INTRAVENOUS
  Filled 2017-06-13: qty 5
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
## 2017-06-13 NOTE — Progress Notes (Signed)
Upon placement of Foley patient was noted to have stool coming from vagina. Area cleaned thoroughly before foley placed. NP notified. No new orders. Will continue to closely monitor.

## 2017-06-13 NOTE — Progress Notes (Signed)
PROGRESS NOTE  Deanna Holloway GGE:[id number] DOB: 01/10/1946 DOA: 06/12/2017 PCP: System, Pcp Not In  Brief History:  72 year old female with a history of myeloma with resultant hypercalcemia presenting with 1 week history of rectal pain and stool coming out of her vagina.  The patient was very frustrated using lactulose because it was causing her to have loose stool and stool incontinence.  She began using MiraLAX instead, but stopped it 4 days prior to this admission because she felt that it was causing the stool to come out of her vagina.  She had lower abdominal discomfort without any nausea or vomiting.  She denied any hematochezia or melena.  There is been no fevers, chills, dysuria, hematuria.  In the emergency department, the patient was noted to have urinary retention.  A Foley catheter was placed in the emergency department.  Since admission, she continued to have stool coming out of her vagina.  Repeat abdominal x-ray on the morning of 06/13/2017 showed persistent stool distending the rectum.  Assessment/Plan: Fecal impaction/obstipation -Continue stool softeners -Mag citrate x1 -If no improvement, may need to try Nu Lytely -underlying cause = hypercalcemia  Vaginal Stool -concern about colo-vaginal fistula -general surgery consult -may need Barium Enema vs CT pelvis to clarify--defer to surgery  Hematochezia -GI consult -I visualized small amount of blood mixed with loose stool  Hypercalcemia -redose Zometa today -continue IVF -repeat CMP in am -06/13/17 corrected calcium 13.2 -secondary to Myeloma  Acute urine retention -due to hypercalcemia -foley placed in ED -keep foley for now until Ca improving  Myeloma--no in remission -pt refused last dose Velcade--last received on 05/05/17 -she has been refusing dexamethasone -follows at [place]  Anemia due to malignancy -received 2 units PRBC on 05/30/17 -baseline Hgb 8-9  Hypokalemia -repleted  CKD  stage 3 -baseline creatinine 1.2-1.5 -monitor with bisphosphonate  Thrombocytopenia -due to myeloma -am CBC    Disposition Plan:   Home in 1-2 days  Family Communication:   No Family at bedside  Consultants:  GI, general surgery  Code Status:  DNR  DVT Prophylaxis:  SCDs   Procedures: As Listed in Progress Note Above  Antibiotics: None    Subjective: Patient continues to complain of loose stools.  She denies any headache, chest pain, shortness breath, nausea, vomiting,  abdominal pain.  Objective: Vitals:   06/12/17 1930 06/12/17 2000 06/12/17 2100 06/13/17 0410  BP: (!) 109/58 (!) 105/58 110/60 (!) 110/50  Pulse: (!) 102 99 99 100  Resp: '16 19 19 18  ' Temp:   98 F (36.7 C) 97.8 F (36.6 C)  TempSrc:   Oral Oral  SpO2: 93% 90% 92% 93%  Weight:   53.5 kg (118 lb)   Height:        Intake/Output Summary (Last 24 hours) at 06/13/2017 0749 Last data filed at 06/13/2017 0015 Gross per 24 hour  Intake 500 ml  Output 1575 ml  Net -1075 ml   Weight change:  Exam:   General:  Pt is alert, follows commands appropriately, not in acute distress  HEENT: No icterus, No thrush, No neck mass, [place]/AT  Cardiovascular: RRR, S1/S2, no rubs, no gallops  Respiratory: CTA bilaterally, no wheezing, no crackles, no rhonchi  Abdomen: Soft/+BS, non tender, non distended, no guarding  Extremities: No edema, No lymphangitis, No petechiae, No rashes, no synovitis   Data Reviewed: I have personally reviewed following labs and imaging studies Basic Metabolic Panel: Recent Labs  Lab 06/06/17 1309 06/09/17 1223 06/12/17 1218 06/13/17 0601  NA 132* 132* 132* 137  K 3.2* 3.3* 3.4* 4.1  CL 95* 96* 96* 105  CO2 '27 24 24 24  ' GLUCOSE 121* 118* 107* 92  BUN 23* 23* 28* 26*  CREATININE 1.26* 1.20* 1.57* 1.47*  CALCIUM 14.0* 12.3* 12.1* 11.6*   Liver Function Tests: Recent Labs  Lab 06/06/17 1309 06/09/17 1223 06/12/17 1218 06/13/17 0601  AST 32 35 27 22  ALT '15 17 17  ' 13*  ALKPHOS 89 86 97 84  BILITOT 0.6 0.7 0.7 0.6  PROT 7.9 7.8 7.9 6.8  ALBUMIN 2.5* 2.5* 2.5* 2.0*   No results for input(s): LIPASE, AMYLASE in the last 168 hours. No results for input(s): AMMONIA in the last 168 hours. Coagulation Profile: No results for input(s): INR, PROTIME in the last 168 hours. CBC: Recent Labs  Lab 06/06/17 1309 06/09/17 1223 06/12/17 1218 06/13/17 0601  WBC 5.8 5.6 5.2 4.5  NEUTROABS 4.2 4.2 4.2 3.3  HGB 10.0* 9.6* 9.2* 8.1*  HCT 30.3* 29.4* 27.9* 24.5*  MCV 92.9 93.0 93.0 94.2  PLT 121* 123* 124* 110*   Cardiac Enzymes: No results for input(s): CKTOTAL, CKMB, CKMBINDEX, TROPONINI in the last 168 hours. BNP: Invalid input(s): POCBNP CBG: No results for input(s): GLUCAP in the last 168 hours. HbA1C: No results for input(s): HGBA1C in the last 72 hours. Urine analysis:    Component Value Date/Time   COLORURINE YELLOW 06/12/2017 1218   APPEARANCEUR CLOUDY (A) 06/12/2017 1218   LABSPEC 1.010 06/12/2017 1218   PHURINE 6.0 06/12/2017 1218   GLUCOSEU NEGATIVE 06/12/2017 1218   HGBUR SMALL (A) 06/12/2017 [place] 06/12/2017 [place] 06/12/2017 1218   PROTEINUR NEGATIVE 06/12/2017 1218   NITRITE NEGATIVE 06/12/2017 1218   LEUKOCYTESUR MODERATE (A) 06/12/2017 1218   Sepsis Labs: '@LABRCNTIP' (procalcitonin:4,lacticidven:4) )No results found for this or any previous visit (from the past 240 hour(s)).   Scheduled Meds: . magnesium citrate  1 Bottle Oral Once   Continuous Infusions: . sodium chloride 75 mL/hr at 06/12/17 2138    Procedures/Studies: Dg Abdomen Acute W/chest  Result Date: 06/12/2017 CLINICAL DATA:  72 year old female with nausea and constipation. Multiple myeloma. EXAM: DG ABDOMEN ACUTE W/ 1V CHEST COMPARISON:  CT Abdomen and Pelvis 04/06/2017 and earlier. FINDINGS: AP upright view of the chest. The lungs appear clear. No pneumothorax or pneumoperitoneum. Stable cardiac size and mediastinal  contours. Visualized tracheal air column is within normal limits. Rounded masslike soft tissue contour projecting into the lower abdomen from the pelvis when correlated with the January CT Abdomen and Pelvis likely represents severe urinary bladder distension. Prominent gas-filled transverse colon, but overall nonobstructed bowel-gas pattern. Retained stool in the rectum. Diffuse severe osteopenia in keeping with widespread multiple myeloma involvement. Probable pathologic fracture of the right lateral 6th rib. Stable proximal left humerus deformity. IMPRESSION: 1. Severe distension of the urinary bladder suspected. Ultrasound could confirm. 2. Nonobstructed bowel gas pattern, but possible fecal impaction, and moderate retained stool elsewhere in the colon. 3. No free air. 4.  No acute cardiopulmonary abnormality. 5. Diffuse skeletal multiple myeloma. Electronically Signed   By: Genevie Ann M.D.   On: 06/12/2017 14:06   Dg Abd Portable 1v  Result Date: 06/13/2017 CLINICAL DATA:  Fecal impaction. EXAM: PORTABLE ABDOMEN - 1 VIEW COMPARISON:  Radiographs yesterday. FINDINGS: Decreased pelvic soft tissue prominence, likely decompressed urinary bladder. Stool distending the rectum persists, no significant interval change. Similar moderate stool burden  in the more proximal colon. Slight increase in gaseous colonic distention. No small bowel dilatation or evidence of obstruction. No evidence of free air on supine view. Diffuse bony under mineralization. IMPRESSION: Similar rectal distention with stool suspicious for fecal impaction. Similar stool burden proximally, with slight increase gaseous colonic distention. No small bowel dilatation or obstruction. Electronically Signed   By: Jeb Levering M.D.   On: 06/13/2017 06:11    Orson Eva, DO  [facility] Pager [phone]  If 7PM-7AM, please contact night-coverage [email or web address] Password [email or web address] 06/13/2017, 7:49 AM   LOS: 0 days

## 2017-06-13 NOTE — Progress Notes (Signed)
Initial Nutrition Assessment  DOCUMENTATION CODES:      INTERVENTION:  Regular diet   Encourage fluid intake  Offered Nutrition Therapy for Constipation handout  Recommend PT assessment  NUTRITION DIAGNOSIS:   Altered GI function related to decreased appetite(limited intake c/o abdominal pain ) as evidenced by per patient/family report(refractory constipation- obstipation).   GOAL:   Patient will meet greater than or equal to 90% of their needs MONITOR:   PO intake, Labs, Weight trends  REASON FOR ASSESSMENT:   Consult Assessment of nutrition requirement/status  ASSESSMENT:  Deanna Holloway is a 72 yo female with hx of multiple myeloma who presents with obstipation. CT- pelvis findings- moderate stool burden /no obstruction. GI has assessed pt CT pelvis and possible flex / sig for tomorrow.   Patient endorses consuming an "organic" diet that she adopted approximately 4 years ago. She frequently used the word "my special foods" when referring to her general intake. Her diet includes fresh vegetables, fresh fruits, special fish 2x weekly, probiotic pill she purchases at [employer]. She uses Almond milk for cereal and drinks green tea with meals. She reportedly squeezes fresh juice for breakfast daily. She doesn't eat beef or chicken. She buys farm raised local eggs but only eats the yolks (she offered no explanation for this). She regularly includes avocados with her food intake. Patient says her vegetables are cooked soft and she eats small amounts throughout the day. Very little protein intake based on her rendered diet recall.  Stable weight compared to January wt of 121 lb. She has muscle loss as noted upper body. Also very limited mobility based on our conversation. She has been increasingly weak and has been difficult for her to get around even in the house.   BMP Latest Ref Rng & Units 06/13/2017 06/12/2017 06/09/2017  Glucose 65 - 99 mg/dL 92 107(H) 118(H)  BUN 6 - 20 mg/dL  26(H) 28(H) 23(H)  Creatinine 0.44 - 1.00 mg/dL 1.47(H) 1.57(H) 1.20(H)  Sodium 135 - 145 mmol/L 137 132(L) 132(L)  Potassium 3.5 - 5.1 mmol/L 4.1 3.4(L) 3.3(L)  Chloride 101 - 111 mmol/L 105 96(L) 96(L)  CO2 22 - 32 mmol/L _0 Calcium 8.9 - 10.3 mg/dL 11.6(H) 12.1(H) 12.3(H)    Scheduled Meds: . ENSURE  237 mL Oral QID  . pantoprazole  40 mg Oral Daily  . polyethylene glycol  17 g Oral TID   Continuous Infusions: . 0.9 % NaCl with KCl 20 mEq / L     PRN Meds:.acetaminophen **OR** acetaminophen, ondansetron **OR** ondansetron (ZOFRAN) IV   NUTRITION - FOCUSED PHYSICAL EXAM: Moderate temporal, clavicle muscle loss, no edema.   Diet Order:  Diet full liquid Room service appropriate? Yes; Fluid consistency: Thin Diet NPO time specified Except for: Sips with Meds  EDUCATION NEEDS:  (offered pt Nutrition Therapy for Constipation from [employer]- she refused handout says she has already received before)   Skin:  Skin Assessment: Reviewed RN Assessment  Last BM:  3/18  Height:   Ht Readings from Last 1 Encounters:  06/12/17 _1  (1.6 m)    Weight:   Wt Readings from Last 1 Encounters:  06/12/17 118 lb (53.5 kg)    Ideal Body Weight:  52.2 kg  BMI:  Body mass index is 20.9 kg/m.  Estimated Nutritional Needs:   Kcal:  1500-1600 (to prevent wt loss)  Protein:  65-70 gr   Fluid:  >1500 ml daily   Colman Cater MS,RD,CSG,LDN Office: [phone] Pager: [phone]

## 2017-06-13 NOTE — Progress Notes (Addendum)
Pt has refused Miralax at this time. Patient states she is having lose stools and does not need it. Educated patient on the reason for it but patient still declines at this time.

## 2017-06-13 NOTE — Progress Notes (Signed)
Pt unable to void and has blood coming from rectum which have multiple hemorrhoids. Bladder scan showed plus 975ml. NP notified. Foley catheter and occult stool ordered.

## 2017-06-14 ENCOUNTER — Inpatient Hospital Stay (HOSPITAL_COMMUNITY): Payer: Medicare Other | Admitting: Anesthesiology

## 2017-06-14 ENCOUNTER — Encounter (HOSPITAL_COMMUNITY): Payer: Self-pay | Admitting: Anesthesiology

## 2017-06-14 ENCOUNTER — Encounter (HOSPITAL_COMMUNITY)
Admission: EM | Disposition: A | Discharge status: Home / Self Care | Payer: Self-pay | Source: Home / Self Care | Attending: Internal Medicine

## 2017-06-14 DIAGNOSIS — R1310 Dysphagia, unspecified: Secondary | ICD-10-CM

## 2017-06-14 DIAGNOSIS — R933 Abnormal findings on diagnostic imaging of other parts of digestive tract: Secondary | ICD-10-CM

## 2017-06-14 HISTORY — PX: ESOPHAGEAL DILATION: SHX303

## 2017-06-14 HISTORY — PX: ESOPHAGOGASTRODUODENOSCOPY (EGD) WITH PROPOFOL: SHX5813

## 2017-06-14 LAB — COMPREHENSIVE METABOLIC PANEL
ALBUMIN: 1.9 g/dL — AB (ref 3.5–5.0)
ALT: 12 U/L — ABNORMAL LOW (ref 14–54)
AST: 21 U/L (ref 15–41)
Alkaline Phosphatase: 77 U/L (ref 38–126)
Anion gap: 9 (ref 5–15)
BUN: 24 mg/dL — AB (ref 6–20)
CHLORIDE: 103 mmol/L (ref 101–111)
CO2: 24 mmol/L (ref 22–32)
Calcium: 11 mg/dL — ABNORMAL HIGH (ref 8.9–10.3)
Creatinine, Ser: 1.28 mg/dL — ABNORMAL HIGH (ref 0.44–1.00)
GFR calc Af Amer: 47 mL/min — ABNORMAL LOW (ref >60)
GFR calc non Af Amer: 41 mL/min — ABNORMAL LOW (ref >60)
GLUCOSE: 86 mg/dL (ref 65–99)
Potassium: 3.7 mmol/L (ref 3.5–5.1)
SODIUM: 136 mmol/L (ref 135–145)
Total Bilirubin: 0.5 mg/dL (ref 0.3–1.2)
Total Protein: 6.7 g/dL (ref 6.5–8.1)

## 2017-06-14 LAB — CBC WITH DIFFERENTIAL/PLATELET
Basophils Absolute: 0 10*3/uL (ref 0.0–0.1)
Basophils Relative: 0 %
EOS ABS: 0 10*3/uL (ref 0.0–0.7)
Eosinophils Relative: 1 %
HCT: 24.6 % — ABNORMAL LOW (ref 36.0–46.0)
HEMOGLOBIN: 8 g/dL — AB (ref 12.0–15.0)
Lymphocytes Relative: 15 %
Lymphs Abs: 0.7 10*3/uL (ref 0.7–4.0)
MCH: 30.8 pg (ref 26.0–34.0)
MCHC: 32.5 g/dL (ref 30.0–36.0)
MCV: 94.6 fL (ref 78.0–100.0)
MONOS PCT: 9 %
Monocytes Absolute: 0.4 10*3/uL (ref 0.1–1.0)
NEUTROS PCT: 75 %
Neutro Abs: 3.2 10*3/uL (ref 1.7–7.7)
Platelets: 117 10*3/uL — ABNORMAL LOW (ref 150–400)
RBC: 2.6 MIL/uL — AB (ref 3.87–5.11)
RDW: 15.8 % — ABNORMAL HIGH (ref 11.5–15.5)
WBC: 4.3 10*3/uL (ref 4.0–10.5)

## 2017-06-14 SURGERY — ESOPHAGOGASTRODUODENOSCOPY (EGD) WITH PROPOFOL
Anesthesia: Monitor Anesthesia Care

## 2017-06-14 MED ORDER — MIDAZOLAM HCL 2 MG/2ML IJ SOLN
1.0000 mg | INTRAMUSCULAR | Status: AC
  Administered 2017-06-14: 2 mg via INTRAVENOUS

## 2017-06-14 MED ORDER — FENTANYL CITRATE (PF) 100 MCG/2ML IJ SOLN
25.0000 ug | Freq: Once | INTRAMUSCULAR | Status: AC
  Administered 2017-06-14: 25 ug via INTRAVENOUS

## 2017-06-14 MED ORDER — LIDOCAINE VISCOUS 2 % MT SOLN
OROMUCOSAL | Status: AC
  Filled 2017-06-14: qty 15

## 2017-06-14 MED ORDER — LACTATED RINGERS IV SOLN
INTRAVENOUS | Status: DC
  Administered 2017-06-14: 14:00:00 via INTRAVENOUS

## 2017-06-14 MED ORDER — PROPOFOL 500 MG/50ML IV EMUL
INTRAVENOUS | Status: DC | PRN
  Administered 2017-06-14: 150 ug/kg/min via INTRAVENOUS

## 2017-06-14 MED ORDER — MIDAZOLAM HCL 2 MG/2ML IJ SOLN
INTRAMUSCULAR | Status: AC
  Filled 2017-06-14: qty 2

## 2017-06-14 MED ORDER — STERILE WATER FOR IRRIGATION IR SOLN
Status: DC | PRN
  Administered 2017-06-14: 2.5 mL

## 2017-06-14 MED ORDER — FENTANYL CITRATE (PF) 100 MCG/2ML IJ SOLN
INTRAMUSCULAR | Status: AC
  Filled 2017-06-14: qty 2

## 2017-06-14 NOTE — Progress Notes (Signed)
[facility]  Dr. Gala Romney unable to do flex sig due to stool burden. Reported sginificant stool on anal region during his exam.  I have seen the patient in PACU and have looked at the anus and performed a DRE and vaginal exam with permission from the patient.   Concerning cutaneous changes around the anus that are more serious and concerning than just a diaper rash/ maceration. Some condylomatous lesions come from the anal verge. The posterior skin with dark pigmentation, difficult to say if this is all hemorrhagic changes or even melanotic changes.   Rectal exam could feel stool and disimpacted some but minimal due to discomfort from the patient.  No clear masses in the rectum but again there was stool and no clear masses in the vagina on palpation.      Will likely end up needing an exam under anesthesia and some biopsies of this lesion and a good vagina exam as well.  Think likely will need Ob involved and we can plan to do a joint EUA.    [name], MD [facility] [address] [place], [place] [address] [phone] (office)

## 2017-06-14 NOTE — Op Note (Signed)
[facility] Patient Name: Deanna Holloway Procedure Date: 06/14/2017 2:30 PM MRN: [id number] Date of Birth: [birth year] Attending MD: Norvel Richards , MD CSN: [id number] Age: 72 Admit Type: Outpatient Procedure:                Upper GI endoscopy Indications:              Dysphagia, Abnormal CT of the GI tract Providers:                Norvel Richards, MD, Lurline Del, RN, Nelma Rothman, Technician Referring MD:              Medicines:                Propofol per Anesthesia Complications:            No immediate complications. Estimated Blood Loss:     Estimated blood loss: none. Procedure:                Pre-Anesthesia Assessment:                           - Prior to the procedure, a History and Physical                            was performed, and patient medications and                            allergies were reviewed. The patient's tolerance of                            previous anesthesia was also reviewed. The risks                            and benefits of the procedure and the sedation                            options and risks were discussed with the patient.                            All questions were answered, and informed consent                            was obtained. Prior Anticoagulants: The patient has                            taken no previous anticoagulant or antiplatelet                            agents. ASA Grade Assessment: III - A patient with                            severe systemic disease. After reviewing the risks  and benefits, the patient was deemed in                            satisfactory condition to undergo the procedure.                           After obtaining informed consent, the endoscope was                            passed under direct vision. Throughout the                            procedure, the patient's blood pressure, pulse, and                             oxygen saturations were monitored continuously. The                            EG-[id number]) scope was introduced through the                            and advanced to the second part of duodenum. The                            upper GI endoscopy was accomplished without                            difficulty. The patient tolerated the procedure                            well. Scope In: 2:54:47 PM Scope Out: 3:02:11 PM Total Procedure Duration: 0 hours 7 minutes 24 seconds  Findings:      The examined esophagus was normal.      A small hiatal hernia was present.      The exam was otherwise without abnormality.      The duodenal bulb and second portion of the duodenum were normal. The       scope was withdrawn. Dilation attempted with a [place]. Acute       resistance met in the back of the throat. I backed off and obtained a 50       [place] dilator. this dilator was passed and full insertion with       only mild resistance. The dilation site was examined following endoscope       reinsertion and showed no change. Estimated blood loss: none.      Digital rectal exam in the setting of a large amount of viscous form       stool coating the perianal skin and up in the rectum - precluded       sigmoidoscopy. Impression:               - Normal esophagus. dilated.                           - Small hiatal hernia.                           -  The examination was otherwise normal.                           - Normal duodenal bulb and second portion of the                            duodenum.                           - No specimens collected. FS not done Moderate Sedation:      Moderate (conscious) sedation was personally administered by an       anesthesia professional. The following parameters were monitored: oxygen       saturation, heart rate, blood pressure, respiratory rate, EKG, adequacy       of pulmonary ventilation, and response to care. Total physician        intraservice time was 14 minutes. Recommendation:           - Patient has a contact number available for                            emergencies. The signs and symptoms of potential                            delayed complications were discussed with the                            patient. Return to normal activities tomorrow.                            Written discharge instructions were provided to the                            patient.                           - Advance diet as tolerated.                           - Return patient to hospital ward for ongoing care.                            Further management of apparent rectovaginal fistula                            per general surgery                           - Continue present medications. Procedure Code(s):        --- Professional ---                           [phone], Esophagogastroduodenoscopy, flexible,                            transoral; diagnostic, including collection of  specimen(s) by brushing or washing, when performed                            (separate procedure)                           43450, Dilation of esophagus, by unguided sound or                            bougie, single or multiple passes Diagnosis Code(s):        --- Professional ---                           K44.9, Diaphragmatic hernia without obstruction or                            gangrene                           R13.10, Dysphagia, unspecified                           R93.3, Abnormal findings on diagnostic imaging of                            other parts of digestive tract CPT copyright 2016 American Medical Association. All rights reserved. The codes documented in this report are preliminary and upon coder review may  be revised to meet current compliance requirements. Cristopher Estimable. Sheamus Hasting, MD Norvel Richards, MD 06/14/2017 3:34:21 PM This report has been signed electronically. Number of Addenda: 0

## 2017-06-14 NOTE — Progress Notes (Signed)
Patient refused miralax last night at 2200. Patient also refused tap water enema for this am. Patient states she is having lose stools and does not need it. Educated patient on the reason for it but patient still declines at this time.

## 2017-06-14 NOTE — Progress Notes (Signed)
Pt has refused Miralax at this time. Pt states that GI has spoken with her and she was informed that she should not have anything else to drink before procedure.

## 2017-06-14 NOTE — Progress Notes (Signed)
Incontinent of large amt of brown soft stool. Rectal area with raised red ecchymotic areas. Dr [name] at bed side to check perineum. Perineum cleansed and clean linens applied. Tolerated well.

## 2017-06-14 NOTE — Progress Notes (Addendum)
[facility] Progress Note     Subjective: Patient refused Miralax and tap water enema. Received Ct with rectal contrast overnight. Does show a rectovaginal fistula, no obvious masses. Large stool ball remains in place with liquid stool./ contrast around it.  Patient Continues to refuse attempts to get this stool out of her rectum due to an experience in the ED with an enema per her report.  Refused rectal exam again today.    Objective: Vital signs in last 24 hours: Temp:  [98.4 F (36.9 C)-98.7 F (37.1 C)] 98.4 F (36.9 C) (03/20 0629) Pulse Rate:  [95-101] 97 (03/20 0629) Resp:  [16-18] 16 (03/20 0629) BP: (112-116)/(53-67) 114/53 (03/20 0629) SpO2:  [94 %-99 %] 94 % (03/20 0629) Last BM Date: 06/14/17  Intake/Output from previous day: 03/19 0701 - 03/20 0700 In: 824.2 [P.O.:240; I.V.:584.2] Out: 3200 [Urine:3200] Intake/Output this shift: Total I/O In: -  Out: 600 [Urine:600]  General appearance: alert, cooperative and no distress Resp: normal work breathing GI: soft, mildly distended, lower abdominal pain, no rebound or guarding  Lab Results:  Recent Labs    06/13/17 0601 06/13/17 1219 06/14/17 0504  WBC 4.5  --  4.3  HGB 8.1* 8.4* 8.0*  HCT 24.5* 25.8* 24.6*  PLT 110*  --  117*   BMET Recent Labs    06/13/17 0601 06/14/17 0504  NA 137 136  K 4.1 3.7  CL 105 103  CO2 24 24  GLUCOSE 92 86  BUN 26* 24*  CREATININE 1.47* 1.28*  CALCIUM 11.6* 11.0*   Personally reviewed the CT scan- large stool burden in the rectum, stool in vagina and contrast   Studies/Results: Ct Pelvis Wo Contrast  Result Date: 06/14/2017 CLINICAL DATA:  Nausea and constipation.  Evaluate for fistula. EXAM: CT PELVIS WITHOUT CONTRAST TECHNIQUE: Multidetector CT imaging of the pelvis was performed following the standard protocol without intravenous contrast. COMPARISON:  04/06/2017 FINDINGS: Urinary Tract:  Bladder decompressed by Foley catheter. Bowel: Enema tip is  identified in the rectum with large stool volume in the rectum. Rectum is opacified consistent with retrograde placement of rectal contrast material. Sagittal image 55 of series 5 shows a thin linear tract of contrast passing from the anterior wall of the rectum to the posterior wall the vagina. The vagina is filled with gas and debris and contrast material is seen in the upper vaginal vault. There is more contrast in the upper vagina than in the lower vagina suggesting migration of contrast through a fistula rather than reflux of contrast into the vagina. Vascular/Lymphatic: Unremarkable. Reproductive:  As above. Other:  No substantial intraperitoneal free fluid. Musculoskeletal: Diffuse body wall edema evident. Stable appearance of heterogeneous bone mineralization. IMPRESSION: Imaging features compatible with rectovaginal fistula. The fistula track is demonstrated on sagittal reconstructions. Diffuse body wall edema. Electronically Signed   By: Misty Stanley M.D.   On: 06/14/2017 10:18   Dg Abdomen Acute W/chest  Result Date: 06/12/2017 CLINICAL DATA:  72 year old female with nausea and constipation. Multiple myeloma. EXAM: DG ABDOMEN ACUTE W/ 1V CHEST COMPARISON:  CT Abdomen and Pelvis 04/06/2017 and earlier. FINDINGS: AP upright view of the chest. The lungs appear clear. No pneumothorax or pneumoperitoneum. Stable cardiac size and mediastinal contours. Visualized tracheal air column is within normal limits. Rounded masslike soft tissue contour projecting into the lower abdomen from the pelvis when correlated with the January CT Abdomen and Pelvis likely represents severe urinary bladder distension. Prominent gas-filled transverse colon, but overall nonobstructed bowel-gas pattern. Retained  stool in the rectum. Diffuse severe osteopenia in keeping with widespread multiple myeloma involvement. Probable pathologic fracture of the right lateral 6th rib. Stable proximal left humerus deformity. IMPRESSION: 1.  Severe distension of the urinary bladder suspected. Ultrasound could confirm. 2. Nonobstructed bowel gas pattern, but possible fecal impaction, and moderate retained stool elsewhere in the colon. 3. No free air. 4.  No acute cardiopulmonary abnormality. 5. Diffuse skeletal multiple myeloma. Electronically Signed   By: Genevie Ann M.D.   On: 06/12/2017 14:06   Dg Abd Portable 1v  Result Date: 06/13/2017 CLINICAL DATA:  Fecal impaction. EXAM: PORTABLE ABDOMEN - 1 VIEW COMPARISON:  Radiographs yesterday. FINDINGS: Decreased pelvic soft tissue prominence, likely decompressed urinary bladder. Stool distending the rectum persists, no significant interval change. Similar moderate stool burden in the more proximal colon. Slight increase in gaseous colonic distention. No small bowel dilatation or evidence of obstruction. No evidence of free air on supine view. Diffuse bony under mineralization. IMPRESSION: Similar rectal distention with stool suspicious for fecal impaction. Similar stool burden proximally, with slight increase gaseous colonic distention. No small bowel dilatation or obstruction. Electronically Signed   By: Jeb Levering M.D.   On: 06/13/2017 06:11     Assessment/Plan: Deanna Holloway is a 72 y.o. female with constipation and rectovaginal fistula clinically and seen on CT scan. Refusing miralax and enemas. Is suppose to get a flex sigmoidoscopy today but unsure if Gi will do or not given lack of enemas.  Patient and POA were not exactly aware of the procedures when I was speaking to them about it.    Discussed at length that her refusing the miralax and enemas is preventing [place] from caring for her and identifying the exact etiology of the fistula. Likely this is from a stercoral ulcer but she has never had a scope.    Discussed again that this is not a life threatening situation, and that she  Can live with this fistula although it is annoying. She is adamant that she wants something done due to  the pain in her vaginal/rectal area but difficult to get her to fully describe the "debilitating pain" that she is having and where it is located.    Again discussed with them that a colostomy is only to divert the stool from the fistula and not to treat the constipation.  She would still require medications for this, and that surgery is not without risk in this patient with multiple myeloma.    I spent over 25 minutes discussing the information with this patient and answering questions.     LOS: 1 day    Virl Cagey 06/14/2017

## 2017-06-14 NOTE — Anesthesia Preprocedure Evaluation (Signed)
Anesthesia Evaluation  Patient identified by MRN, date of birth, ID band Patient awake    Reviewed: Allergy & Precautions, NPO status , Patient's Chart, lab work & pertinent test results  Airway Mallampati: II  TM Distance: >3 FB Neck ROM: Full    Dental  (+) Teeth Intact, Implants   Pulmonary neg pulmonary ROS,    Pulmonary exam normal        Cardiovascular negative cardio ROS   Rhythm:Regular Rate:Normal     Neuro/Psych negative neurological ROS  negative psych ROS   GI/Hepatic Rectal bleed, IDA   Endo/Other    Renal/GU Renal disease     Musculoskeletal   Abdominal   Peds  Hematology  (+) anemia ,   Anesthesia Other Findings Multiple myeloma not in remission.   Reproductive/Obstetrics                             Anesthesia Physical Anesthesia Plan  ASA: III  Anesthesia Plan: MAC   Post-op Pain Management:    Induction: Intravenous  PONV Risk Score and Plan:   Airway Management Planned: Simple Face Mask  Additional Equipment:   Intra-op Plan:   Post-operative Plan:   Informed Consent: I have reviewed the patients History and Physical, chart, labs and discussed the procedure including the risks, benefits and alternatives for the proposed anesthesia with the patient or authorized representative who has indicated his/her understanding and acceptance.     Plan Discussed with:   Anesthesia Plan Comments:         Anesthesia Quick Evaluation

## 2017-06-14 NOTE — Progress Notes (Signed)
Patient briefly seen and examined.  Admitted due to rectal pain and stool from vaginal vault.  She was found to have a recto-vaginal fistula on CT scan.  GI and surgery are on board.  Was to have flex sig today that could not be performed due to inadequate prep, patient is refusing enemas.  Discussed with surgery, Dr. [name] who recommends formal exam under anesthesia and would like GYN to be on board for this.  GYN consult has been requested.  We will continue to follow closely.  Domingo Mend, MD [facility] Pager: [phone]

## 2017-06-14 NOTE — Anesthesia Postprocedure Evaluation (Signed)
Anesthesia Post Note  Patient: Deanna Holloway  Procedure(s) Performed: ESOPHAGOGASTRODUODENOSCOPY (EGD) WITH PROPOFOL (N/A ) FLEXIBLE SIGMOIDOSCOPY WITH PROPOFOL (N/A )  Patient location during evaluation: PACU Anesthesia Type: MAC Level of consciousness: awake and alert, oriented and patient cooperative Pain management: pain level controlled Vital Signs Assessment: post-procedure vital signs reviewed and stable Respiratory status: spontaneous breathing and respiratory function stable Cardiovascular status: stable Postop Assessment: no apparent nausea or vomiting Anesthetic complications: no     Last Vitals:  Vitals:   06/14/17 1420 06/14/17 1425  BP: (!) 106/57 (!) 104/56  Pulse:    Resp: (!) 24 19  Temp:    SpO2: 99% 98%    Last Pain:  Vitals:   06/14/17 1339  TempSrc: Oral  PainSc: 5                  ADAMS, AMY A

## 2017-06-14 NOTE — Anesthesia Procedure Notes (Signed)
Procedure Name: MAC Date/Time: 06/14/2017 2:45 PM Performed by: Andree Elk Amy A, CRNA Pre-anesthesia Checklist: Patient identified, Emergency Drugs available, Suction available, Patient being monitored and Timeout performed Oxygen Delivery Method: Simple face mask

## 2017-06-14 NOTE — Progress Notes (Signed)
Continues to be incontinent of large amt of soft brown stool. Perineum cleansed. Clean linens applied. Tolerated well.

## 2017-06-14 NOTE — Progress Notes (Addendum)
Patient seen in short stay. Abnormal esophagus on CT. She does have to limit what she swallows to very small amounts for fear of things getting stuck.   Discussed EGD with esophageal dilation today.  She has declined any enemas for sigmoidoscopy. After EGD performed, will attempt FS if DRE negative for stool. However, with inadequate prep, this is unlikely as discussed with the patient. The risks, benefits, limitations, imponderables and alternatives regarding both EGD and FS have been reviewed with the patient. Questions have been answered. All parties agreeable.

## 2017-06-14 NOTE — Transfer of Care (Signed)
Immediate Anesthesia Transfer of Care Note  Patient: Deanna Holloway  Procedure(s) Performed: ESOPHAGOGASTRODUODENOSCOPY (EGD) WITH PROPOFOL (N/A ) FLEXIBLE SIGMOIDOSCOPY WITH PROPOFOL (N/A )  Patient Location: PACU  Anesthesia Type:MAC  Level of Consciousness: awake, alert , oriented and patient cooperative  Airway & Oxygen Therapy: Patient Spontanous Breathing and Patient connected to face mask oxygen  Post-op Assessment: Report given to RN and Post -op Vital signs reviewed and stable  Post vital signs: Reviewed and stable  Last Vitals:  Vitals:   06/14/17 1420 06/14/17 1425  BP: (!) 106/57 (!) 104/56  Pulse:    Resp: (!) 24 19  Temp:    SpO2: 99% 98%    Last Pain:  Vitals:   06/14/17 1339  TempSrc: Oral  PainSc: 5       Patients Stated Pain Goal: 5 (61/44/31 5400)  Complications: No apparent anesthesia complications

## 2017-06-14 NOTE — Progress Notes (Signed)
I PERSONALLY REVIEWED THE CT WITH DR. Valli Glance. PT HAS EVIDENCE OF FISTULA(3 MM)BETWEEN VAGINA AND RECTUM. AIR AND CONTRAST SEEN IN VAGINA. NO MASS APPRECIATED. TAP WATER ENEMA AT 0700 AND 0800. EGD/FLEX SIG ON MAR 20.

## 2017-06-14 NOTE — Progress Notes (Addendum)
Spoke with patient. She refused miralax but she did complete mag citrate yesterday. Complained of pain in rectum/vagina with it. Previous 10/10 pain with attempted enema and therefore refusing enemas now. Passing liquid stool. CT pelvis reviewed by Dr. Oneida Alar last night but official reports not yet available. Dr. Gala Romney aware. She is scheduled for EGD/flex sig. If stool is thin, potentially could perform flex sig.   Laureen Ochs. Bernarda Caffey [facility] [phone] 3/20/20198:27 AM

## 2017-06-15 ENCOUNTER — Ambulatory Visit (HOSPITAL_COMMUNITY): Payer: Medicare Other | Admitting: Hematology

## 2017-06-15 DIAGNOSIS — K5900 Constipation, unspecified: Secondary | ICD-10-CM

## 2017-06-15 DIAGNOSIS — N824 Other female intestinal-genital tract fistulae: Secondary | ICD-10-CM

## 2017-06-15 LAB — BASIC METABOLIC PANEL
ANION GAP: 10 (ref 5–15)
BUN: 22 mg/dL — ABNORMAL HIGH (ref 6–20)
CHLORIDE: 102 mmol/L (ref 101–111)
CO2: 22 mmol/L (ref 22–32)
Calcium: 10.6 mg/dL — ABNORMAL HIGH (ref 8.9–10.3)
Creatinine, Ser: 1.23 mg/dL — ABNORMAL HIGH (ref 0.44–1.00)
GFR calc non Af Amer: 43 mL/min — ABNORMAL LOW (ref >60)
GFR, EST AFRICAN AMERICAN: 50 mL/min — AB (ref >60)
Glucose, Bld: 72 mg/dL (ref 65–99)
Potassium: 3.5 mmol/L (ref 3.5–5.1)
Sodium: 134 mmol/L — ABNORMAL LOW (ref 135–145)

## 2017-06-15 LAB — CBC WITH DIFFERENTIAL/PLATELET
BASOS ABS: 0 10*3/uL (ref 0.0–0.1)
BASOS PCT: 0 %
Eosinophils Absolute: 0.1 10*3/uL (ref 0.0–0.7)
Eosinophils Relative: 1 %
HEMATOCRIT: 25.7 % — AB (ref 36.0–46.0)
HEMOGLOBIN: 8.5 g/dL — AB (ref 12.0–15.0)
Lymphocytes Relative: 18 %
Lymphs Abs: 0.7 10*3/uL (ref 0.7–4.0)
MCH: 31.6 pg (ref 26.0–34.0)
MCHC: 33.1 g/dL (ref 30.0–36.0)
MCV: 95.5 fL (ref 78.0–100.0)
MONOS PCT: 12 %
Monocytes Absolute: 0.5 10*3/uL (ref 0.1–1.0)
NEUTROS ABS: 2.8 10*3/uL (ref 1.7–7.7)
NEUTROS PCT: 69 %
Platelets: 124 10*3/uL — ABNORMAL LOW (ref 150–400)
RBC: 2.69 MIL/uL — AB (ref 3.87–5.11)
RDW: 15.6 % — ABNORMAL HIGH (ref 11.5–15.5)
WBC: 4.1 10*3/uL (ref 4.0–10.5)

## 2017-06-15 MED ORDER — CEFAZOLIN SODIUM-DEXTROSE 2-4 GM/100ML-% IV SOLN
2.0000 g | INTRAVENOUS | Status: AC
  Administered 2017-06-16: 2 g via INTRAVENOUS
  Filled 2017-06-15: qty 100

## 2017-06-15 MED ORDER — CHLORHEXIDINE GLUCONATE CLOTH 2 % EX PADS
6.0000 | MEDICATED_PAD | Freq: Once | CUTANEOUS | Status: AC
  Administered 2017-06-15: 6 via TOPICAL

## 2017-06-15 MED ORDER — CALCITONIN (SALMON) 200 UNIT/ACT NA SOLN
1.0000 | Freq: Every day | NASAL | Status: DC
  Filled 2017-06-15: qty 3.7

## 2017-06-15 MED ORDER — CHLORHEXIDINE GLUCONATE CLOTH 2 % EX PADS
6.0000 | MEDICATED_PAD | Freq: Once | CUTANEOUS | Status: AC
  Administered 2017-06-16: 6 via TOPICAL

## 2017-06-15 MED ORDER — LINACLOTIDE 145 MCG PO CAPS
145.0000 ug | ORAL_CAPSULE | Freq: Every day | ORAL | Status: DC
  Administered 2017-06-17: 145 ug via ORAL
  Filled 2017-06-15 (×2): qty 1

## 2017-06-15 NOTE — Anesthesia Postprocedure Evaluation (Signed)
Anesthesia Post Note  Patient: Deanna Holloway  Procedure(s) Performed: ESOPHAGOGASTRODUODENOSCOPY (EGD) WITH PROPOFOL (N/A ) ESOPHAGEAL DILATION  Patient location during evaluation: Nursing Unit Anesthesia Type: MAC Level of consciousness: awake Pain management: pain level controlled Vital Signs Assessment: post-procedure vital signs reviewed and stable Respiratory status: spontaneous breathing, nonlabored ventilation and respiratory function stable Cardiovascular status: blood pressure returned to baseline Postop Assessment: no apparent nausea or vomiting Anesthetic complications: no     Last Vitals:  Vitals Value Taken Time  BP    Temp    Pulse    Resp    SpO2      Last Pain:  Vitals:   06/15/17 0000  TempSrc:   PainSc: 0-No pain                 Tasnia Spegal J
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
## 2017-06-15 NOTE — Progress Notes (Signed)
Subjective:  No complaints.   Objective: Vital signs in last 24 hours: Temp:  [97.9 F (36.6 C)-98.8 F (37.1 C)] 98.7 F (37.1 C) (03/20 1744) Pulse Rate:  [92-105] 92 (03/20 2202) Resp:  [17-29] 18 (03/20 2202) BP: (96-121)/(45-75) 117/57 (03/20 2202) SpO2:  [95 %-100 %] 95 % (03/20 2202) Last BM Date: 06/14/17 General:   Alert,  Well-developed, well-nourished, pleasant and cooperative in NAD Head:  Normocephalic and atraumatic. Eyes:  Sclera clear, no icterus.  Abdomen:  Soft, nontender and nondistended.   Normal bowel sounds, without guarding, and without rebound.   Extremities:  Without clubbing, deformity or edema. Neurologic:  Alert and  oriented x4;  grossly normal neurologically. Skin:  Intact without significant lesions or rashes. Psych:  Alert and cooperative. Normal mood and affect.  Intake/Output from previous day: 03/20 0701 - 03/21 0700 In: 1295 [P.O.:240; I.V.:1055] Out: 600 [Urine:600] Intake/Output this shift: No intake/output data recorded.  Lab Results: CBC Recent Labs    06/13/17 0601 06/13/17 1219 06/14/17 0504 06/15/17 0600  WBC 4.5  --  4.3 4.1  HGB 8.1* 8.4* 8.0* 8.5*  HCT 24.5* 25.8* 24.6* 25.7*  MCV 94.2  --  94.6 95.5  PLT 110*  --  117* 124*   BMET Recent Labs    06/13/17 0601 06/14/17 0504 06/15/17 0600  NA 137 136 134*  K 4.1 3.7 3.5  CL 105 103 102  CO2 _0 GLUCOSE 92 86 72  BUN 26* 24* 22*  CREATININE 1.47* 1.28* 1.23*  CALCIUM 11.6* 11.0* 10.6*   LFTs Recent Labs    06/12/17 1218 06/13/17 0601 06/14/17 0504  BILITOT 0.7 0.6 0.5  ALKPHOS 97 84 77  AST _1 ALT 17 13* 12*  PROT 7.9 6.8 6.7  ALBUMIN 2.5* 2.0* 1.9*   No results for input(s): LIPASE in the last 72 hours. PT/INR No results for input(s): LABPROT, INR in the last 72 hours.    Imaging Studies: Ct Pelvis Wo Contrast  Result Date: 06/14/2017 CLINICAL DATA:  Nausea and constipation.  Evaluate for fistula. EXAM: CT PELVIS WITHOUT CONTRAST  TECHNIQUE: Multidetector CT imaging of the pelvis was performed following the standard protocol without intravenous contrast. COMPARISON:  04/06/2017 FINDINGS: Urinary Tract:  Bladder decompressed by Foley catheter. Bowel: Enema tip is identified in the rectum with large stool volume in the rectum. Rectum is opacified consistent with retrograde placement of rectal contrast material. Sagittal image 55 of series 5 shows a thin linear tract of contrast passing from the anterior wall of the rectum to the posterior wall the vagina. The vagina is filled with gas and debris and contrast material is seen in the upper vaginal vault. There is more contrast in the upper vagina than in the lower vagina suggesting migration of contrast through a fistula rather than reflux of contrast into the vagina. Vascular/Lymphatic: Unremarkable. Reproductive:  As above. Other:  No substantial intraperitoneal free fluid. Musculoskeletal: Diffuse body wall edema evident. Stable appearance of heterogeneous bone mineralization. IMPRESSION: Imaging features compatible with rectovaginal fistula. The fistula track is demonstrated on sagittal reconstructions. Diffuse body wall edema. Electronically Signed   By: Misty Stanley M.D.   On: 06/14/2017 10:18   Dg Abdomen Acute W/chest  Result Date: 06/12/2017 CLINICAL DATA:  72 year old female with nausea and constipation. Multiple myeloma. EXAM: DG ABDOMEN ACUTE W/ 1V CHEST COMPARISON:  CT Abdomen and Pelvis 04/06/2017 and earlier. FINDINGS: AP upright view of the chest. The lungs appear clear. No pneumothorax or  pneumoperitoneum. Stable cardiac size and mediastinal contours. Visualized tracheal air column is within normal limits. Rounded masslike soft tissue contour projecting into the lower abdomen from the pelvis when correlated with the January CT Abdomen and Pelvis likely represents severe urinary bladder distension. Prominent gas-filled transverse colon, but overall nonobstructed bowel-gas  pattern. Retained stool in the rectum. Diffuse severe osteopenia in keeping with widespread multiple myeloma involvement. Probable pathologic fracture of the right lateral 6th rib. Stable proximal left humerus deformity. IMPRESSION: 1. Severe distension of the urinary bladder suspected. Ultrasound could confirm. 2. Nonobstructed bowel gas pattern, but possible fecal impaction, and moderate retained stool elsewhere in the colon. 3. No free air. 4.  No acute cardiopulmonary abnormality. 5. Diffuse skeletal multiple myeloma. Electronically Signed   By: Genevie Ann M.D.   On: 06/12/2017 14:06   Dg Abd Portable 1v  Result Date: 06/13/2017 CLINICAL DATA:  Fecal impaction. EXAM: PORTABLE ABDOMEN - 1 VIEW COMPARISON:  Radiographs yesterday. FINDINGS: Decreased pelvic soft tissue prominence, likely decompressed urinary bladder. Stool distending the rectum persists, no significant interval change. Similar moderate stool burden in the more proximal colon. Slight increase in gaseous colonic distention. No small bowel dilatation or evidence of obstruction. No evidence of free air on supine view. Diffuse bony under mineralization. IMPRESSION: Similar rectal distention with stool suspicious for fecal impaction. Similar stool burden proximally, with slight increase gaseous colonic distention. No small bowel dilatation or obstruction. Electronically Signed   By: Jeb Levering M.D.   On: 06/13/2017 06:11  [2 weeks]   Assessment: 72 y/o female with Multiple Myeloma, chronic constipation, h/o multiple fecal disimpactions in the ED presenting with rectal bleeding, persistent fecal seepage and stool leakage from vagina. CT with evidence of rectovaginal fistula and prior CT with circumferential wall thickening of the rectum and perirectal edema/inflmmation. She did not have flex sig yesterday due to poor prep/patient refused enemas. No prior colonoscopy. Reason for rectovaginal fistula not yet identified. Surgery on board and  wanting rectal exam and vaginal exam under anesthesia in a joint effort with gyn. Also noted persistent impaction and condylomatous lesions around anus, see picture.   EGD yesterday for dysphagia and prior abnormal esophagus on CT 03/2017 showed small hiatal hernia. Normal esophagus, s/p dilation given h/o dysphagia.   Constipation/impaction: patient has not tolerated disimpaction attempts. She is refusing Miralax and enemas.    Plan: 1. Add Linzess 137mg daily. 2. Further management of rectovaginal fistula per general surgery.  3. Will follow peripherally.   LLaureen Ochs LBernarda CaffeyREl [facility] [phone]:31 AM     LOS: 2 days

## 2017-06-15 NOTE — Progress Notes (Signed)
[facility] Progress Note  1 Day Post-Op  Subjective: No major complaints.   Objective: Vital signs in last 24 hours: Temp:  [98.6 F (37 C)-98.7 F (37.1 C)] 98.6 F (37 C) (03/21 1500) Pulse Rate:  [92-95] 95 (03/21 1500) Resp:  [18] 18 (03/21 1500) BP: (104-117)/(45-57) 115/56 (03/21 1500) SpO2:  [95 %-98 %] 96 % (03/21 1500) Last BM Date: 06/14/17  Intake/Output from previous day: 03/20 0701 - 03/21 0700 In: 1295 [P.O.:240; I.V.:1055] Out: 600 [Urine:600] Intake/Output this shift: Total I/O In: 240 [P.O.:240] Out: -   General appearance: alert, cooperative and no distress Resp: normal work breathing GI: soft, distended, tender lower abdomen  Lab Results:  Recent Labs    06/14/17 0504 06/15/17 0600  WBC 4.3 4.1  HGB 8.0* 8.5*  HCT 24.6* 25.7*  PLT 117* 124*   BMET Recent Labs    06/14/17 0504 06/15/17 0600  NA 136 134*  K 3.7 3.5  CL 103 102  CO2 24 22  GLUCOSE 86 72  BUN 24* 22*  CREATININE 1.28* 1.23*  CALCIUM 11.0* 10.6*   PT/INR No results for input(s): LABPROT, INR in the last 72 hours.  Studies/Results: Ct Pelvis Wo Contrast  Result Date: 06/14/2017 CLINICAL DATA:  Nausea and constipation.  Evaluate for fistula. EXAM: CT PELVIS WITHOUT CONTRAST TECHNIQUE: Multidetector CT imaging of the pelvis was performed following the standard protocol without intravenous contrast. COMPARISON:  04/06/2017 FINDINGS: Urinary Tract:  Bladder decompressed by Foley catheter. Bowel: Enema tip is identified in the rectum with large stool volume in the rectum. Rectum is opacified consistent with retrograde placement of rectal contrast material. Sagittal image 55 of series 5 shows a thin linear tract of contrast passing from the anterior wall of the rectum to the posterior wall the vagina. The vagina is filled with gas and debris and contrast material is seen in the upper vaginal vault. There is more contrast in the upper vagina than in the lower  vagina suggesting migration of contrast through a fistula rather than reflux of contrast into the vagina. Vascular/Lymphatic: Unremarkable. Reproductive:  As above. Other:  No substantial intraperitoneal free fluid. Musculoskeletal: Diffuse body wall edema evident. Stable appearance of heterogeneous bone mineralization. IMPRESSION: Imaging features compatible with rectovaginal fistula. The fistula track is demonstrated on sagittal reconstructions. Diffuse body wall edema. Electronically Signed   By: Misty Stanley M.D.   On: 06/14/2017 10:18     Assessment/Plan: Ms. Loring is a 72 yo with a rectovaginal fistula identified on CT likely from constipation but unclear etiology and new findings of severe changes to her perianal region.   - Need to coordinate with Gyn as far as EUA to look at the area and vagina and plan for biopsies  - If needs to be done as outpatient can come back in for EUA electively     LOS: 2 days    Virl Cagey 06/15/2017

## 2017-06-15 NOTE — Addendum Note (Signed)
Addendum  created 06/15/17 0806 by Charmaine Downs, CRNA   Sign clinical note

## 2017-06-15 NOTE — Progress Notes (Signed)
PROGRESS NOTE    Deanna Holloway  NLG:[id number] DOB: June 03, 1945 DOA: 06/12/2017 PCP: System, Pcp Not In     Brief Narrative:  72 year old woman admitted from home on 3/18 due to rectal pain and stool emerging from her vaginal vault.  CT scan was performed and she was found to have a rectovaginal fistula.  GI and surgery are on board.  GI attempted flex sig on 3/20 but was unsuccessful due to inadequate prep and patient refusal to do enemas.  Discussed in detail with Dr. [name] who is  wanting a rectal and vaginal exam under anesthesia in a joint effort with GYN.  Dr. Glo Herring has been consulted.  She is also noted to have persistent fecal impaction as well as condylomatous lesions and weird discoloration around her anus, see picture from general surgery's note for details.  Admission is requested.   Assessment & Plan:   Active Problems:   Hypercalcemia   Multiple myeloma (HCC)   Multiple myeloma not having achieved remission (HCC)   Hypokalemia   CKD (chronic kidney disease) stage 3, GFR 30-59 ml/min (HCC)   Fecal impaction (HCC)   Obstipation   Hematochezia   Hypercalcemia of malignancy   Colovaginal fistula   Rectovaginal fistula -Management as per general surgery, they are wanting to do an exam under anesthesia with GYN assistance.  Fecal impaction/obstipation -Patient refuses enemas, GI has added Linzess.  Acute urinary retention -Foley catheter in place. -Suspect due to hypercalcemia. -May attempt voiding trial once treatment plan for rectovaginal fistula has been determined.  Hypercalcemia -Due to malignancy, patient has a history of multiple myeloma. -Calcium down to 10.6 today.  Corrected calcium is 12.6. -Received bisphosphonate on 3/19. -Continue IV fluid hydration. -Add intranasal calcitonin.  Multiple myeloma, not in remission -Apparently patient refused her last dose of Velcade and dexamethasone. -She follows up at the [place]  [facility].  Thrombocytopenia -Platelet count remains stable at around 120,000. -Secondary to ongoing malignancy.  Hypokalemia -Potassium 3.5 today  Chronic kidney disease stage III -Baseline creatinine between 1.2 and 1.5 -Remains at baseline at 1.23   DVT prophylaxis: SCDs Code Status: Limited code Family Communication: Patient only Disposition Plan: As per general surgery  Consultants:   GI  General surgery  GYN pending  Procedures:   None  Antimicrobials:  Anti-infectives (From admission, onward)   None       Subjective: Feels well, flat affect  Objective: Vitals:   06/14/17 1744 06/14/17 2202 06/15/17 1032 06/15/17 1500  BP: (!) 104/45 (!) 117/57  (!) 115/56  Pulse:  92  95  Resp: '18 18  18  ' Temp: 98.7 F (37.1 C)   98.6 F (37 C)  TempSrc: Oral Oral  Oral  SpO2: 98% 95% 96% 96%  Weight:      Height:        Intake/Output Summary (Last 24 hours) at 06/15/2017 1641 Last data filed at 06/15/2017 0930 Gross per 24 hour  Intake 480 ml  Output -  Net 480 ml   Filed Weights   06/12/17 1141 06/12/17 2100  Weight: 50.3 kg (111 lb) 53.5 kg (118 lb)    Examination:  General exam: Alert, awake, oriented x 3 Respiratory system: Clear to auscultation. Respiratory effort normal. Cardiovascular system:RRR. No murmurs, rubs, gallops. Gastrointestinal system: Abdomen is nondistended, soft and nontender. No organomegaly or masses felt. Normal bowel sounds heard. Central nervous system: Alert and oriented. No focal neurological deficits. Extremities: No C/C/E, +pedal pulses Psychiatry: Very flat affect  Data Reviewed: I have personally reviewed following labs and imaging studies  CBC: Recent Labs  Lab 06/09/17 1223 06/12/17 1218 06/13/17 0601 06/13/17 1219 06/14/17 0504 06/15/17 0600  WBC 5.6 5.2 4.5  --  4.3 4.1  NEUTROABS 4.2 4.2 3.3  --  3.2 2.8  HGB 9.6* 9.2* 8.1* 8.4* 8.0* 8.5*  HCT 29.4* 27.9* 24.5* 25.8* 24.6* 25.7*  MCV 93.0 93.0  94.2  --  94.6 95.5  PLT 123* 124* 110*  --  117* 179*   Basic Metabolic Panel: Recent Labs  Lab 06/09/17 1223 06/12/17 1218 06/13/17 0601 06/14/17 0504 06/15/17 0600  NA 132* 132* 137 136 134*  K 3.3* 3.4* 4.1 3.7 3.5  CL 96* 96* 105 103 102  CO2 '24 24 24 24 22  ' GLUCOSE 118* 107* 92 86 72  BUN 23* 28* 26* 24* 22*  CREATININE 1.20* 1.57* 1.47* 1.28* 1.23*  CALCIUM 12.3* 12.1* 11.6* 11.0* 10.6*   GFR: Estimated Creatinine Clearance: 34.2 mL/min (A) (by C-G formula based on SCr of 1.23 mg/dL (H)). Liver Function Tests: Recent Labs  Lab 06/09/17 1223 06/12/17 1218 06/13/17 0601 06/14/17 0504  AST 35 '27 22 21  ' ALT 17 17 13* 12*  ALKPHOS 86 97 84 77  BILITOT 0.7 0.7 0.6 0.5  PROT 7.8 7.9 6.8 6.7  ALBUMIN 2.5* 2.5* 2.0* 1.9*   No results for input(s): LIPASE, AMYLASE in the last 168 hours. No results for input(s): AMMONIA in the last 168 hours. Coagulation Profile: No results for input(s): INR, PROTIME in the last 168 hours. Cardiac Enzymes: No results for input(s): CKTOTAL, CKMB, CKMBINDEX, TROPONINI in the last 168 hours. BNP (last 3 results) No results for input(s): PROBNP in the last 8760 hours. HbA1C: No results for input(s): HGBA1C in the last 72 hours. CBG: No results for input(s): GLUCAP in the last 168 hours. Lipid Profile: No results for input(s): CHOL, HDL, LDLCALC, TRIG, CHOLHDL, LDLDIRECT in the last 72 hours. Thyroid Function Tests: No results for input(s): TSH, T4TOTAL, FREET4, T3FREE, THYROIDAB in the last 72 hours. Anemia Panel: No results for input(s): VITAMINB12, FOLATE, FERRITIN, TIBC, IRON, RETICCTPCT in the last 72 hours. Urine analysis:    Component Value Date/Time   COLORURINE YELLOW 06/12/2017 1218   APPEARANCEUR CLOUDY (A) 06/12/2017 1218   LABSPEC 1.010 06/12/2017 1218   PHURINE 6.0 06/12/2017 1218   GLUCOSEU NEGATIVE 06/12/2017 1218   HGBUR SMALL (A) 06/12/2017 [place] 06/12/2017 [place]  06/12/2017 1218   PROTEINUR NEGATIVE 06/12/2017 1218   NITRITE NEGATIVE 06/12/2017 1218   LEUKOCYTESUR MODERATE (A) 06/12/2017 1218   Sepsis Labs: '@LABRCNTIP' (procalcitonin:4,lacticidven:4)  )No results found for this or any previous visit (from the past 240 hour(s)).       Radiology Studies: Ct Pelvis Wo Contrast  Result Date: 06/14/2017 CLINICAL DATA:  Nausea and constipation.  Evaluate for fistula. EXAM: CT PELVIS WITHOUT CONTRAST TECHNIQUE: Multidetector CT imaging of the pelvis was performed following the standard protocol without intravenous contrast. COMPARISON:  04/06/2017 FINDINGS: Urinary Tract:  Bladder decompressed by Foley catheter. Bowel: Enema tip is identified in the rectum with large stool volume in the rectum. Rectum is opacified consistent with retrograde placement of rectal contrast material. Sagittal image 55 of series 5 shows a thin linear tract of contrast passing from the anterior wall of the rectum to the posterior wall the vagina. The vagina is filled with gas and debris and contrast material is seen in the upper vaginal vault. There is more  contrast in the upper vagina than in the lower vagina suggesting migration of contrast through a fistula rather than reflux of contrast into the vagina. Vascular/Lymphatic: Unremarkable. Reproductive:  As above. Other:  No substantial intraperitoneal free fluid. Musculoskeletal: Diffuse body wall edema evident. Stable appearance of heterogeneous bone mineralization. IMPRESSION: Imaging features compatible with rectovaginal fistula. The fistula track is demonstrated on sagittal reconstructions. Diffuse body wall edema. Electronically Signed   By: Misty Stanley M.D.   On: 06/14/2017 10:18        Scheduled Meds: . feeding supplement (ENSURE ENLIVE)  237 mL Oral QID  . linaclotide  145 mcg Oral QAC breakfast  . pantoprazole  40 mg Oral Daily   Continuous Infusions: . 0.9 % NaCl with KCl 20 mEq / L 50 mL/hr at 06/14/17 2258      LOS: 2 days    Time spent: 25 minutes. Greater than 50% of this time was spent in direct contact with the patient coordinating care.     Lelon Frohlich, MD [facility] Pager [phone]  If 7PM-7AM, please contact night-coverage [email or web address] Password [facility] 06/15/2017, 4:41 PM

## 2017-06-15 NOTE — Plan of Care (Signed)
progressing 

## 2017-06-15 NOTE — Progress Notes (Signed)
[facility]  Discussed patient with Dr. Glo Herring. Plan for EUA tomorrow. Will hopefully be able to do a flexible sigmoidoscopy and disimpaction, and plans for biopsy of the perianal skin lesions.  Discussed with the patient the risk of bleeding, risk of requiring more procedures, and risk of infection given the biopsies.   [name], MD [facility] [address] [place], [place] [address] [phone] (office)

## 2017-06-16 ENCOUNTER — Encounter (HOSPITAL_COMMUNITY)
Admission: EM | Disposition: A | Discharge status: Home / Self Care | Payer: Self-pay | Source: Home / Self Care | Attending: Internal Medicine

## 2017-06-16 ENCOUNTER — Inpatient Hospital Stay (HOSPITAL_COMMUNITY): Payer: Medicare Other | Admitting: Anesthesiology

## 2017-06-16 ENCOUNTER — Encounter (HOSPITAL_COMMUNITY): Payer: Self-pay | Admitting: *Deleted

## 2017-06-16 DIAGNOSIS — D398 Neoplasm of uncertain behavior of other specified female genital organs: Secondary | ICD-10-CM

## 2017-06-16 DIAGNOSIS — L988 Other specified disorders of the skin and subcutaneous tissue: Secondary | ICD-10-CM

## 2017-06-16 DIAGNOSIS — D375 Neoplasm of uncertain behavior of rectum: Secondary | ICD-10-CM

## 2017-06-16 DIAGNOSIS — L989 Disorder of the skin and subcutaneous tissue, unspecified: Secondary | ICD-10-CM

## 2017-06-16 HISTORY — PX: FLEXIBLE SIGMOIDOSCOPY: SHX5431

## 2017-06-16 HISTORY — PX: SKIN BIOPSY: SHX1

## 2017-06-16 LAB — BASIC METABOLIC PANEL
ANION GAP: 9 (ref 5–15)
BUN: 22 mg/dL — ABNORMAL HIGH (ref 6–20)
CHLORIDE: 107 mmol/L (ref 101–111)
CO2: 23 mmol/L (ref 22–32)
CREATININE: 1.21 mg/dL — AB (ref 0.44–1.00)
Calcium: 10.6 mg/dL — ABNORMAL HIGH (ref 8.9–10.3)
GFR calc non Af Amer: 44 mL/min — ABNORMAL LOW (ref >60)
GFR, EST AFRICAN AMERICAN: 51 mL/min — AB (ref >60)
Glucose, Bld: 91 mg/dL (ref 65–99)
POTASSIUM: 4.2 mmol/L (ref 3.5–5.1)
SODIUM: 139 mmol/L (ref 135–145)

## 2017-06-16 LAB — COMPREHENSIVE METABOLIC PANEL
ALBUMIN: 2 g/dL — AB (ref 3.5–5.0)
ALK PHOS: 78 U/L (ref 38–126)
ALT: 12 U/L — ABNORMAL LOW (ref 14–54)
ANION GAP: 8 (ref 5–15)
AST: 21 U/L (ref 15–41)
BUN: 21 mg/dL — AB (ref 6–20)
CALCIUM: 10.6 mg/dL — AB (ref 8.9–10.3)
CO2: 23 mmol/L (ref 22–32)
Chloride: 103 mmol/L (ref 101–111)
Creatinine, Ser: 1.22 mg/dL — ABNORMAL HIGH (ref 0.44–1.00)
GFR calc Af Amer: 50 mL/min — ABNORMAL LOW (ref >60)
GFR, EST NON AFRICAN AMERICAN: 43 mL/min — AB (ref >60)
GLUCOSE: 95 mg/dL (ref 65–99)
Potassium: 3.6 mmol/L (ref 3.5–5.1)
Sodium: 134 mmol/L — ABNORMAL LOW (ref 135–145)
Total Bilirubin: 0.3 mg/dL (ref 0.3–1.2)
Total Protein: 7.1 g/dL (ref 6.5–8.1)

## 2017-06-16 LAB — CBC WITH DIFFERENTIAL/PLATELET
BASOS PCT: 0 %
Basophils Absolute: 0 10*3/uL (ref 0.0–0.1)
EOS ABS: 0.1 10*3/uL (ref 0.0–0.7)
Eosinophils Relative: 1 %
HCT: 35 % — ABNORMAL LOW (ref 36.0–46.0)
HEMOGLOBIN: 11.7 g/dL — AB (ref 12.0–15.0)
Lymphocytes Relative: 12 %
Lymphs Abs: 0.8 10*3/uL (ref 0.7–4.0)
MCH: 30.3 pg (ref 26.0–34.0)
MCHC: 33.4 g/dL (ref 30.0–36.0)
MCV: 90.7 fL (ref 78.0–100.0)
MONOS PCT: 9 %
Monocytes Absolute: 0.6 10*3/uL (ref 0.1–1.0)
NEUTROS ABS: 4.8 10*3/uL (ref 1.7–7.7)
NEUTROS PCT: 78 %
Platelets: 117 10*3/uL — ABNORMAL LOW (ref 150–400)
RBC: 3.86 MIL/uL — AB (ref 3.87–5.11)
RDW: 16.2 % — ABNORMAL HIGH (ref 11.5–15.5)
WBC: 6.2 10*3/uL (ref 4.0–10.5)

## 2017-06-16 LAB — PROTIME-INR
INR: 1.03
PROTHROMBIN TIME: 13.4 s (ref 11.4–15.2)

## 2017-06-16 SURGERY — SIGMOIDOSCOPY, FLEXIBLE
Anesthesia: General

## 2017-06-16 MED ORDER — BACITRACIN ZINC 500 UNIT/GM EX OINT
TOPICAL_OINTMENT | CUTANEOUS | Status: DC | PRN
  Administered 2017-06-16: 3 via TOPICAL

## 2017-06-16 MED ORDER — PHENYLEPHRINE HCL 10 MG/ML IJ SOLN
INTRAMUSCULAR | Status: DC | PRN
  Administered 2017-06-16: 40 ug via INTRAVENOUS
  Administered 2017-06-16: 80 ug via INTRAVENOUS
  Administered 2017-06-16 (×2): 40 ug via INTRAVENOUS

## 2017-06-16 MED ORDER — FENTANYL CITRATE (PF) 100 MCG/2ML IJ SOLN
INTRAMUSCULAR | Status: AC
  Filled 2017-06-16: qty 2

## 2017-06-16 MED ORDER — 0.9 % SODIUM CHLORIDE (POUR BTL) OPTIME
TOPICAL | Status: DC | PRN
  Administered 2017-06-16: 1000 mL

## 2017-06-16 MED ORDER — LIDOCAINE HCL 1 % IJ SOLN
INTRAMUSCULAR | Status: DC | PRN
  Administered 2017-06-16: 20 mg via INTRADERMAL

## 2017-06-16 MED ORDER — OXYCODONE HCL 5 MG PO TABS: 5.0000 mg | ORAL_TABLET | ORAL | Status: DC | PRN

## 2017-06-16 MED ORDER — LACTATED RINGERS IV SOLN
INTRAVENOUS | Status: DC
  Administered 2017-06-16: 14:00:00 via INTRAVENOUS

## 2017-06-16 MED ORDER — MIDAZOLAM HCL 2 MG/2ML IJ SOLN
1.0000 mg | INTRAMUSCULAR | Status: DC
  Administered 2017-06-16: 2 mg via INTRAVENOUS

## 2017-06-16 MED ORDER — BACITRACIN ZINC 500 UNIT/GM EX OINT
TOPICAL_OINTMENT | CUTANEOUS | Status: AC
  Filled 2017-06-16: qty 1.8

## 2017-06-16 MED ORDER — MIDAZOLAM HCL 2 MG/2ML IJ SOLN
INTRAMUSCULAR | Status: AC
  Filled 2017-06-16: qty 2

## 2017-06-16 MED ORDER — FENTANYL CITRATE (PF) 100 MCG/2ML IJ SOLN
25.0000 ug | INTRAMUSCULAR | Status: DC | PRN
  Administered 2017-06-16 (×2): 25 ug via INTRAVENOUS

## 2017-06-16 MED ORDER — MORPHINE SULFATE (PF) 2 MG/ML IV SOLN
1.0000 mg | INTRAVENOUS | Status: DC | PRN
  Filled 2017-06-16: qty 1

## 2017-06-16 MED ORDER — CEFAZOLIN SODIUM-DEXTROSE 2-4 GM/100ML-% IV SOLN
INTRAVENOUS | Status: AC
  Filled 2017-06-16: qty 100

## 2017-06-16 MED ORDER — FENTANYL CITRATE (PF) 100 MCG/2ML IJ SOLN
INTRAMUSCULAR | Status: DC | PRN
  Administered 2017-06-16 (×4): 25 ug via INTRAVENOUS

## 2017-06-16 MED ORDER — MIDAZOLAM HCL 5 MG/5ML IJ SOLN
INTRAMUSCULAR | Status: DC | PRN
  Administered 2017-06-16: 1 mg via INTRAVENOUS

## 2017-06-16 MED ORDER — STERILE WATER FOR IRRIGATION IR SOLN
Status: DC | PRN
  Administered 2017-06-16: 1000 mL

## 2017-06-16 MED ORDER — DOUBLE ANTIBIOTIC 500-10000 UNIT/GM EX OINT
TOPICAL_OINTMENT | Freq: Two times a day (BID) | CUTANEOUS | Status: DC
  Administered 2017-06-16: 23:00:00 via TOPICAL
  Filled 2017-06-16: qty 28.4

## 2017-06-16 MED ORDER — LIDOCAINE HCL (PF) 1 % IJ SOLN
INTRAMUSCULAR | Status: AC
  Filled 2017-06-16: qty 5

## 2017-06-16 MED ORDER — PROPOFOL 10 MG/ML IV BOLUS
INTRAVENOUS | Status: DC | PRN
  Administered 2017-06-16: 80 mg via INTRAVENOUS

## 2017-06-16 SURGICAL SUPPLY — 28 items
CLOTH BEACON ORANGE TIMEOUT ST (SAFETY) ×2 IMPLANT
COVER LIGHT HANDLE STERIS (MISCELLANEOUS) ×4 IMPLANT
COVER MAYO STAND XLG (DRAPE) ×2 IMPLANT
ELECT BLADE 6 FLAT ULTRCLN (ELECTRODE) ×2 IMPLANT
GLOVE BIO SURGEON STRL SZ 6.5 (GLOVE) ×1 IMPLANT
GLOVE BIO SURGEONS STRL SZ 6.5 (GLOVE) ×1
GLOVE BIOGEL PI IND STRL 6.5 (GLOVE) IMPLANT
GLOVE BIOGEL PI IND STRL 7.0 (GLOVE) IMPLANT
GLOVE BIOGEL PI IND STRL 9 (GLOVE) IMPLANT
GLOVE BIOGEL PI INDICATOR 6.5 (GLOVE) ×2
GLOVE BIOGEL PI INDICATOR 7.0 (GLOVE) ×2
GLOVE BIOGEL PI INDICATOR 9 (GLOVE) ×2
GLOVE ECLIPSE 7.0 STRL STRAW (GLOVE) ×2 IMPLANT
GLOVE ECLIPSE 9.0 STRL (GLOVE) ×2 IMPLANT
GOWN STRL REUS W/ TWL LRG LVL3 (GOWN DISPOSABLE) IMPLANT
GOWN STRL REUS W/TWL 2XL LVL3 (GOWN DISPOSABLE) ×2 IMPLANT
GOWN STRL REUS W/TWL LRG LVL3 (GOWN DISPOSABLE) ×4
HEMOSTAT SURGICEL 4X8 (HEMOSTASIS) ×2 IMPLANT
LUBRICANT JELLY 4.5OZ STERILE (MISCELLANEOUS) ×2 IMPLANT
NS IRRIG 1000ML POUR BTL (IV SOLUTION) ×2 IMPLANT
PACK PERI GYN (CUSTOM PROCEDURE TRAY) ×2 IMPLANT
SPONGE SURGIFOAM ABS GEL 100 (HEMOSTASIS) ×2 IMPLANT
SUT SILK 2 0 (SUTURE) ×2
SUT SILK 2-0 18XBRD TIE 12 (SUTURE) IMPLANT
SUT VIC AB 3-0 SH 27 (SUTURE) ×4
SUT VIC AB 3-0 SH 27X BRD (SUTURE) IMPLANT
SYR BULB IRRIGATION 50ML (SYRINGE) ×2 IMPLANT
WATER STERILE IRR 1000ML POUR (IV SOLUTION) ×2 IMPLANT

## 2017-06-16 NOTE — Care Management Note (Signed)
Case Management Note  Patient Details  Name: Deanna Holloway MRN: [id number] Date of Birth: 29-Nov-1945  Pt seen to begin DC planning discussion. Pt alert and oriented laying in bed, pt has female visitor. Pt gives permission for [place] to talk in from of visitor. When asked who the visitor is to the patient, he speaks up and says they share a home. Pt says she would like the visitor to answer all questions as she is tired of being asked so many. Pt was very weak but ambulating ind with cane pta. She has no walker. She has no HH services pta, but follows very closely with cancer center and speak with them almost daily. She has refused to work with PT today. She is scheduled to undergo a rectal and vaginal exam under anesthesia today. She says she will go home at DC and placement is not an options. They do not feel [place] would be beneficial.   Expected Discharge Date:  06/13/17               Expected Discharge Plan:  Home/Self Care  In-House Referral:  NA  Discharge planning Services  CM Consult  Post Acute Care Choice:  NA Choice offered to:  NA  Status of Service:  Completed, signed off  If discussed at [place] of Stay Meetings, dates discussed:    Additional Comments:  Sherald Barge, RN 06/16/2017, 12:13 PM

## 2017-06-16 NOTE — Progress Notes (Signed)
PT Cancellation Note  Patient Details Name: Deanna Holloway MRN: [id number] DOB: 06-04-1945   Cancelled Treatment:    Reason Eval/Treat Not Completed: Patient declined, no reason specified.  Patient declined therapy and stated she does not want to get out of bed today and has enough support at home - RN, case manager notified.   10:46 AM, 06/16/17 Lonell Grandchild, MPT Physical Therapist with [facility] 336 [phone] office [phone] mobile phone

## 2017-06-16 NOTE — Interval H&P Note (Signed)
History and Physical Interval Note:  06/16/2017 1:20 PM  Deanna Holloway  has presented today for surgery, with the diagnosis of perianal skin changes, rectovaginal fistula  The various methods of treatment have been discussed with the patient and family. After consideration of risks, benefits and other options for treatment, the patient has consented to  Procedure(s): EXAM UNDER ANESTHESIA; disimpaction; Full thickness biopsy of perianal skin; Dr. Glo Herring will do intraoperative vaginal exam; Flexible sigmoidoscopy (N/A) FLEXIBLE SIGMOIDOSCOPY (N/A) as a surgical intervention .  The patient's history has been reviewed, patient examined, no change in status, stable for surgery.  I have reviewed the patient's chart and labs.  Questions were answered to the patient's satisfaction.    Again discussed with [name] and [name].  No further questions.  Virl Cagey

## 2017-06-16 NOTE — Progress Notes (Signed)
[facility]  Nothing per rectum There is packing in the anus and this will either be evacuated by the patient or I will remove it in the AM. Please document if the packing is defecated.   [name], MD [facility] [address] [place], [place] [address] [phone] (office)

## 2017-06-16 NOTE — Progress Notes (Signed)
Pt was reminded at 0000 that she was NPO. She got real upset and stated she did not need to be reminded "this is not my first rodeo" she refused to allow me to remove the drinks from her beside. She stated that the table was her and I could not mess with it. I informed her I wasn't going to throw anything out and that I would simply move the drinks where she could still see them. She became very angry and stated no one had ever done that before and she wasn't going to drink anything. Cups were removed and placed by computer. I gave her swabs and mouth rinses. I reminded her that her procedure would be later on today and the MD does not want her to have any intake. Pt remained very angry. Will continue to monitor patient.

## 2017-06-16 NOTE — Anesthesia Postprocedure Evaluation (Signed)
Anesthesia Post Note  Patient: Deanna Holloway  Procedure(s) Performed: EXAM UNDER ANESTHESIA WITH FLEXIBLE SIGMOIDOSCOPY (1427 Scope in; 1459 scope out) (N/A ) [place] UNDER ANESTHESIA  Patient location during evaluation: PACU Anesthesia Type: General Level of consciousness: awake and patient cooperative Pain management: pain level controlled Vital Signs Assessment: post-procedure vital signs reviewed and stable Respiratory status: spontaneous breathing, nonlabored ventilation and respiratory function stable Cardiovascular status: blood pressure returned to baseline Postop Assessment: no apparent nausea or vomiting Anesthetic complications: no     Last Vitals:  Vitals:   06/16/17 1345 06/16/17 1549  BP:  (!) 108/56  Pulse:  89  Resp: 18 18  Temp:  37.2 C  SpO2: 94% 100%    Last Pain:  Vitals:   06/16/17 1255  TempSrc:   PainSc: 0-No pain                 Har[employer] Zeiss J

## 2017-06-16 NOTE — Anesthesia Preprocedure Evaluation (Signed)
Anesthesia Evaluation  Patient identified by MRN, date of birth, ID band Patient awake    Reviewed: Allergy & Precautions, NPO status , Patient's Chart, lab work & pertinent test results  Airway Mallampati: II  TM Distance: >3 FB Neck ROM: Full    Dental  (+) Teeth Intact, Implants   Pulmonary neg pulmonary ROS,    Pulmonary exam normal        Cardiovascular negative cardio ROS   Rhythm:Regular Rate:Normal     Neuro/Psych negative neurological ROS  negative psych ROS   GI/Hepatic Rectal bleed, IDA   Endo/Other  hypercalcemia  Renal/GU Renal disease     Musculoskeletal   Abdominal   Peds  Hematology  (+) anemia ,   Anesthesia Other Findings Multiple myeloma not in remission.   Reproductive/Obstetrics                             Anesthesia Physical Anesthesia Plan  ASA: III  Anesthesia Plan: General   Post-op Pain Management:    Induction: Intravenous  PONV Risk Score and Plan:   Airway Management Planned: LMA  Additional Equipment:   Intra-op Plan:   Post-operative Plan:   Informed Consent: I have reviewed the patients History and Physical, chart, labs and discussed the procedure including the risks, benefits and alternatives for the proposed anesthesia with the patient or authorized representative who has indicated his/her understanding and acceptance.     Plan Discussed with:   Anesthesia Plan Comments:         Anesthesia Quick Evaluation

## 2017-06-16 NOTE — Op Note (Addendum)
[facility] Operative Note  06/16/17  Preoperative Diagnosis:  Impaction, Rectovaginal fistula, perianal skin changes    Postoperative Diagnosis: Impaction, Perianal Skin Changes, atrophic vaginitis, diverticulosis, proctitis    Procedure(s) Performed:  Exam under anesthesia with anoscopic exam, flexible sigmoidoscopy with disimpaction, biopsy of perianal skin lesions (totaling 2cm), vaginal exam (see separate documentation by Dr. Glo Herring)    Surgeon: Lanell Matar. [name], MD   Assistants:  Mallory Shirk, MD    Anesthesia: General endotracheal   Anesthesiologist:  Dr. Patsey Berthold    Specimens:  Left labial biopsy and right buttock biopsy    Estimated Blood Loss: 250cc    Blood Replacement: 2U pRBC    Complications: Bleeding from superficial posterior mucosal tear    Wound Class: Dirty/ Infected    Operative Indications:  Ms. Appelbaum is a 72 yo female with a history of active multiple myeloma who has been on Velcade and has had issues with constipation. She reported that she started to have stool from her vagina for the past week, and was admitted to the hospital. She was evaluated with a rectal contrasted CT and there appeared to be debride in the vagina consistent with a rectovaginal fistula with a possible fistula connection seen in a wisp of stranding in the sagittal view.  Given these findings and the fact that the patient was found to have extensive perianal skin changes of unknown etiology, she was posted for an exam under anesthesia with flexible sigmoidoscopy for attempts at disimpaction, and joint vaginal exam with Dr. Glo Herring to identify any fistula.    Findings: 1. Friable, vascular and nodular tissue around the perianal region and extending into the labial folds (biospies taken) 2. Inflamed rectum, focal proctitis at the site of the impacted stool but no obvious fistula 3. Vaginal purulent consistent with atrophic vaginitis (see separate note from Dr.  Glo Herring)  4. Diverticulosis in the sigmoid colon  5. Poor anal tone    Procedure: The patient was taken to the operating room and placed supine. General endotracheal anesthesia was induced. Intravenous antibiotics were administered per protocol.  The patient then was placed in lithotomy with all pressure points padded.    Digtial rectal exam demonstrated poor tone but some degree of stenosis of the skin due to the perianal skin changes.  Stool was evacuated from the rectal vault.  A flexible sigmoidoscopy was performed and irrigation was used to evacuate the stool as much as possible.  The scope was inserted to the level of the sigmoid colon and soft stool with diverticulosis was identified.  The rectal valves were identified, and the mid to lower rectum was impacted with stool.  The rectum in this area was inflamed consistent with some degree of proctitis from the stool impaction. There was no signs of any fistula. There was no bubbling from the vagina or stool from the vagina.  During these maneuvers including the manual disimpaction, there was significant purulence from the vagina expressed.    During this portion of the procedure there was a moderate tear in the posterior mucosa of the anus overlying the sphincter complexes with bleeding. This was all below the dentate line.  The patient had some issues with tachycardia to the 120s, and given her preoperative hemoglobin, blood was given.  This was tamponaded with a gauze, and Dr. Glo Herring proceeded with his vaginal exam (see separate note).   Following Dr. Johnnye Sima exam and a unit of blood, the hemodynamics were improved. Full thickness skin biopsies were taken from  the left labial fold and the right buttock region which corresponded to representative samples of the perianal/ perineal lesions.  These were closed with a 3-0 vicryl suture loosely. The tissue was very friable and tore easily.  Bacitracin was applied to the areas.  It was noted that the  posterior anal skin was also torn due to the friable nature of the tissue.  Bacitracin was applied to this area.  The gauze from the anus was removed and a gelfoam with surgicel was rolled up and placed in the anus after being tagged with a silk suture.     Final inspection revealed acceptable hemostasis. All counts were correct at the end of the case. The patient was awakened from anesthesia and extubate without complication.  The patient went to the PACU in stable condition. She had her 2nd unit of blood running at that time.    [name], MD [facility] [address] [place], [place] [address] [phone] (office)

## 2017-06-16 NOTE — Anesthesia Procedure Notes (Signed)
Procedure Name: LMA Insertion Date/Time: 06/16/2017 1:58 PM Performed by: Charmaine Downs, CRNA Pre-anesthesia Checklist: Patient identified, Emergency Drugs available, Suction available and Patient being monitored Patient Re-evaluated:Patient Re-evaluated prior to induction Oxygen Delivery Method: Circle system utilized Preoxygenation: Pre-oxygenation with 100% oxygen Induction Type: IV induction Ventilation: Mask ventilation without difficulty LMA: LMA inserted LMA Size: 3.0 Number of attempts: 1 Placement Confirmation: positive ETCO2 and breath sounds checked- equal and bilateral Tube secured with: Tape Dental Injury: Teeth and Oropharynx as per pre-operative assessment

## 2017-06-16 NOTE — Progress Notes (Signed)
PROGRESS NOTE    Deanna Holloway  UKG:[id number] DOB: Feb 22, 1946 DOA: 06/12/2017 PCP: System, Pcp Not In     Brief Narrative:  72 year old woman admitted from home on 3/18 due to rectal pain and stool emerging from her vaginal vault.  CT scan was performed and she was found to have a rectovaginal fistula.  GI and surgery are on board.  GI attempted flex sig on 3/20 but was unsuccessful due to inadequate prep and patient refusal to do enemas.  Discussed in detail with Dr. [name] who is  wanting a rectal and vaginal exam under anesthesia in a joint effort with GYN.  Dr. Glo Herring has been consulted.  She is also noted to have persistent fecal impaction as well as condylomatous lesions and weird discoloration around her anus, see picture from general surgery's note for details.  Admission is requested.  3/22: Combined exam under anesthesia by GYN and surgery demonstrates no evidence of rectovaginal fistula, Dr. Glo Herring believes there is atrophic vaginitis due to postmenopausal changes as well as retrograde fecal material in the vagina.  There was some pus that was expressed from the vaginal vault.  He does not believe antibiotics are required.  In turn, Dr. [name] has biopsied the anal area in the right labia these results are pending.   Assessment & Plan:   Active Problems:   Hypercalcemia   Multiple myeloma (HCC)   Multiple myeloma not having achieved remission (HCC)   Hypokalemia   CKD (chronic kidney disease) stage 3, GFR 30-59 ml/min (HCC)   Fecal impaction (HCC)   Obstipation   Hematochezia   Hypercalcemia of malignancy   Colovaginal fistula   Disorder of perianal skin   ??Rectovaginal fistula -After exam today and has been determined that patient does not have a rectovaginal fistula.  Fecal impaction/obstipation -Patient refuses enemas, GI has added Linzess. -Patient had significant stool disimpaction today with flexible sigmoidoscopy.  Acute urinary retention -Foley  catheter in place. -Suspect due to hypercalcemia. -May attempt voiding trial once treatment plan for rectovaginal fistula has been determined.  Hypercalcemia -Due to malignancy, patient has a history of multiple myeloma. -Calcium is to 10.6 today.  Corrected calcium is 12. -Received bisphosphonate on 3/19. -Continue IV fluid hydration. - intranasal calcitonin added on 3/21.  Multiple myeloma, not in remission -Apparently patient refused her last dose of Velcade and dexamethasone. -She follows up at the [place] center.  Thrombocytopenia -Platelet count remains stable at around 120,000. -Secondary to ongoing malignancy.  Hypokalemia -Potassium 3.6 today  Chronic kidney disease stage III -Baseline creatinine between 1.2 and 1.5 -Remains at baseline at 1.23   DVT prophylaxis: SCDs Code Status: Limited code Family Communication: Patient only Disposition Plan: As per general surgery, hope for DC home in am  Consultants:   GI  General surgery  GYN   Procedures:   Rectal and vaginal exam under anesthesia today  Antimicrobials:  Anti-infectives (From admission, onward)   Start     Dose/Rate Route Frequency Ordered Stop   06/16/17 0600  ceFAZolin (ANCEF) IVPB 2g/100 mL premix     2 g 200 mL/hr over 30 Minutes Intravenous On call to O.R. 06/15/17 1848 06/16/17 1357       Subjective: Very flat affect, complains of pain following surgery  Objective: Vitals:   06/16/17 1630 06/16/17 1645 06/16/17 1652 06/16/17 1700  BP: 113/76 124/71 110/70 (!) 122/56  Pulse: 79 86 87 85  Resp: _0 Temp:    97.6 F (36.4  C)  TempSrc:      SpO2: 100% 100%  96%  Weight:      Height:        Intake/Output Summary (Last 24 hours) at 06/16/2017 1718 Last data filed at 06/16/2017 1640 Gross per 24 hour  Intake 2417.5 ml  Output 1975 ml  Net 442.5 ml   Filed Weights   06/12/17 1141 06/12/17 2100 06/16/17 1255  Weight: 50.3 kg (111 lb) 53.5 kg (118 lb) 53.5 kg  (118 lb)    Examination:  General exam: Alert, awake, oriented x 3 Respiratory system: Clear to auscultation. Respiratory effort normal. Cardiovascular system:RRR. No murmurs, rubs, gallops. Gastrointestinal system: Abdomen is nondistended, soft and nontender. No organomegaly or masses felt. Normal bowel sounds heard. Central nervous system: Alert and oriented. No focal neurological deficits. Extremities: No C/C/E, +pedal pulses Psychiatry: Flat affect     Data Reviewed: I have personally reviewed following labs and imaging studies  CBC: Recent Labs  Lab 06/12/17 1218 06/13/17 0601 06/13/17 1219 06/14/17 0504 06/15/17 0600  WBC 5.2 4.5  --  4.3 4.1  NEUTROABS 4.2 3.3  --  3.2 2.8  HGB 9.2* 8.1* 8.4* 8.0* 8.5*  HCT 27.9* 24.5* 25.8* 24.6* 25.7*  MCV 93.0 94.2  --  94.6 95.5  PLT 124* 110*  --  117* 859*   Basic Metabolic Panel: Recent Labs  Lab 06/12/17 1218 06/13/17 0601 06/14/17 0504 06/15/17 0600 06/16/17 0637  NA 132* 137 136 134* 134*  K 3.4* 4.1 3.7 3.5 3.6  CL 96* 105 103 102 103  CO2 _0 GLUCOSE 107* 92 86 72 95  BUN 28* 26* 24* 22* 21*  CREATININE 1.57* 1.47* 1.28* 1.23* 1.22*  CALCIUM 12.1* 11.6* 11.0* 10.6* 10.6*   GFR: Estimated Creatinine Clearance: 34.5 mL/min (A) (by C-G formula based on SCr of 1.22 mg/dL (H)). Liver Function Tests: Recent Labs  Lab 06/12/17 1218 06/13/17 0601 06/14/17 0504 06/16/17 0637  AST _1 ALT 17 13* 12* 12*  ALKPHOS 97 84 77 78  BILITOT 0.7 0.6 0.5 0.3  PROT 7.9 6.8 6.7 7.1  ALBUMIN 2.5* 2.0* 1.9* 2.0*   No results for input(s): LIPASE, AMYLASE in the last 168 hours. No results for input(s): AMMONIA in the last 168 hours. Coagulation Profile: No results for input(s): INR, PROTIME in the last 168 hours. Cardiac Enzymes: No results for input(s): CKTOTAL, CKMB, CKMBINDEX, TROPONINI in the last 168 hours. BNP (last 3 results) No results for input(s): PROBNP in the last 8760  hours. HbA1C: No results for input(s): HGBA1C in the last 72 hours. CBG: No results for input(s): GLUCAP in the last 168 hours. Lipid Profile: No results for input(s): CHOL, HDL, LDLCALC, TRIG, CHOLHDL, LDLDIRECT in the last 72 hours. Thyroid Function Tests: No results for input(s): TSH, T4TOTAL, FREET4, T3FREE, THYROIDAB in the last 72 hours. Anemia Panel: No results for input(s): VITAMINB12, FOLATE, FERRITIN, TIBC, IRON, RETICCTPCT in the last 72 hours. Urine analysis:    Component Value Date/Time   COLORURINE YELLOW 06/12/2017 1218   APPEARANCEUR CLOUDY (A) 06/12/2017 1218   LABSPEC 1.010 06/12/2017 1218   PHURINE 6.0 06/12/2017 1218   GLUCOSEU NEGATIVE 06/12/2017 1218   HGBUR SMALL (A) 06/12/2017 [place] 06/12/2017 [place] 06/12/2017 1218   PROTEINUR NEGATIVE 06/12/2017 1218   NITRITE NEGATIVE 06/12/2017 1218   LEUKOCYTESUR MODERATE (A) 06/12/2017 1218   Sepsis Labs: _2 (procalcitonin:4,lacticidven:4)  )No results found for this or  any previous visit (from the past 240 hour(s)).       Radiology Studies: No results found.      Scheduled Meds: . calcitonin (salmon)  1 spray Alternating Nares Daily  . feeding supplement (ENSURE ENLIVE)  237 mL Oral QID  . linaclotide  145 mcg Oral QAC breakfast  . pantoprazole  40 mg Oral Daily  . polymixin-bacitracin   Topical BID   Continuous Infusions: . 0.9 % NaCl with KCl 20 mEq / L 50 mL/hr at 06/15/17 1819     LOS: 3 days    Time spent: 25 minutes. Greater than 50% of this time was spent in direct contact with the patient coordinating care.     Lelon Frohlich, MD [facility] Pager [phone]  If 7PM-7AM, please contact night-coverage [email or web address] Password [facility] 06/16/2017, 5:18 PM

## 2017-06-16 NOTE — Op Note (Signed)
GYN portion of exam under anesthesia: After Dr. [name] completed her portion of the initial flexible sigmoidoscopy I have placed a speculum in the vagina and thoroughly evaluated the vagina for evidence of a rectovaginal fistula.  Of note during the entire colonoscopy there was no bubbling of fluid or air through the vagina.  Speculum exam was performed up to the cervix which could be visualized and appeared surprisingly noninflamed given that there is been some somewhat purulent discharge expressed from the vagina during initial examination My interpretation is that the patient had some inflammatory changes atrophic vaginitis due to the combination of thinned out postmenopausal vaginal tissues and retrograde fecal material in the vagina.  We had a thorough examination of the vagina and saw no evidence of fistula and went back to the CT scan to cross reference the area in question based on the CT and is my impression that there is no evidence of the fistula or abscess.  The CT does not show any evidence of an enlarged uterus do not think there is much chance of a pyometra in the uterus.. It is my impression that that there is no RV fistula

## 2017-06-16 NOTE — OR Nursing (Addendum)
Patient back to room with second unit of PRBC's still going with about 100 to count in transfusion. Unit number [id number]. Reported to [place].

## 2017-06-16 NOTE — Progress Notes (Addendum)
Returned from [employer] at 1700.  Drowsy but awakened to name and stated she was cold.  Second unit of prbc Infusing and foley catheter in place.  Vitals stable.  Pad under patient dry and some old blood around catheter and labia.  Did not want anything to eat or drink.  Friend and POA, Eric at bedside.

## 2017-06-16 NOTE — H&P (View-Only) (Signed)
[facility]  [place] today to discuss the plans for today.  Plan for EUA with Dr. Glo Herring. Plan for vaginal and rectal exam. Also plan for flexible sigmoidoscopy and plan for disimpaction and biopsies of needed areas including the perianal areas.   He has agreed to proceed with the procedure. He plans to sign the consent form when he arrives at 10:30AM.   [name], MD [facility] [address] [place], [place] [address] [phone] (office)

## 2017-06-16 NOTE — Progress Notes (Signed)
[facility]  [place] today to discuss the plans for today.  Plan for EUA with Dr. Glo Herring. Plan for vaginal and rectal exam. Also plan for flexible sigmoidoscopy and plan for disimpaction and biopsies of needed areas including the perianal areas.   He has agreed to proceed with the procedure. He plans to sign the consent form when he arrives at 10:30AM.   [name], MD [facility] [address] [place], [place] [address] [phone] (office)

## 2017-06-16 NOTE — Transfer of Care (Signed)
Immediate Anesthesia Transfer of Care Note  Patient: Deanna Holloway  Procedure(s) Performed: EXAM UNDER ANESTHESIA WITH FLEXIBLE SIGMOIDOSCOPY (1427 Scope in; 1459 scope out) (N/A ) PERIANAL SKIN BIOPSIES VAGINAL EXAM UNDER ANESTHESIA  Patient Location: PACU  Anesthesia Type:General  Level of Consciousness: awake and patient cooperative  Airway & Oxygen Therapy: Patient Spontanous Breathing and Patient connected to face mask oxygen  Post-op Assessment: Report given to RN and Post -op Vital signs reviewed and stable  Post vital signs: Reviewed and stable  Last Vitals:  Vitals Value Taken Time  BP    Temp    Pulse    Resp    SpO2      Last Pain:  Vitals:   06/16/17 1255  TempSrc:   PainSc: 0-No pain      Patients Stated Pain Goal: 5 (14/97/02 6378)  Complications: No apparent anesthesia complications

## 2017-06-16 NOTE — Care Management Important Message (Signed)
Important Message  Patient Details  Name: Deanna Holloway MRN: [id number] Date of Birth: 1946-01-16   Medicare Important Message Given:  Yes    Sherald Barge, RN 06/16/2017, 12:13 PM

## 2017-06-16 NOTE — Progress Notes (Signed)
[facility]  Called and updated Deanna Holloway regarding procedure and the lack of finding a rectovaginal fistula.  Informed him of the need for blood products due to bleeding.   [name], MD [facility] [address] [place], [place] [address] [phone] (office) '

## 2017-06-17 DIAGNOSIS — N183 Chronic kidney disease, stage 3 (moderate): Secondary | ICD-10-CM

## 2017-06-17 LAB — CBC
HEMATOCRIT: 31.3 % — AB (ref 36.0–46.0)
Hemoglobin: 10.3 g/dL — ABNORMAL LOW (ref 12.0–15.0)
MCH: 30 pg (ref 26.0–34.0)
MCHC: 32.9 g/dL (ref 30.0–36.0)
MCV: 91.3 fL (ref 78.0–100.0)
PLATELETS: 116 10*3/uL — AB (ref 150–400)
RBC: 3.43 MIL/uL — AB (ref 3.87–5.11)
RDW: 16.5 % — ABNORMAL HIGH (ref 11.5–15.5)
WBC: 7.4 10*3/uL (ref 4.0–10.5)

## 2017-06-17 LAB — BASIC METABOLIC PANEL
ANION GAP: 10 (ref 5–15)
BUN: 25 mg/dL — ABNORMAL HIGH (ref 6–20)
CO2: 21 mmol/L — AB (ref 22–32)
Calcium: 10.1 mg/dL (ref 8.9–10.3)
Chloride: 106 mmol/L (ref 101–111)
Creatinine, Ser: 1.24 mg/dL — ABNORMAL HIGH (ref 0.44–1.00)
GFR calc Af Amer: 49 mL/min — ABNORMAL LOW (ref >60)
GFR, EST NON AFRICAN AMERICAN: 42 mL/min — AB (ref >60)
GLUCOSE: 89 mg/dL (ref 65–99)
POTASSIUM: 3.8 mmol/L (ref 3.5–5.1)
Sodium: 137 mmol/L (ref 135–145)

## 2017-06-17 LAB — BPAM RBC
Blood Product Expiration Date: 201903312359
Blood Product Expiration Date: 201904112359
ISSUE DATE / TIME: 201903221253
ISSUE DATE / TIME: 201903221503
UNIT TYPE AND RH: 600
Unit Type and Rh: 6200

## 2017-06-17 LAB — TYPE AND SCREEN
ABO/RH(D): A POS
Antibody Screen: POSITIVE
Unit division: 0
Unit division: 0

## 2017-06-17 MED ORDER — POLYSPORIN 500-10000 UNIT/GM EX OINT: 1.0000 "application " | TOPICAL_OINTMENT | Freq: Two times a day (BID) | CUTANEOUS | 12 refills | Status: DC

## 2017-06-17 MED ORDER — CALCITONIN (SALMON) 200 UNIT/ACT NA SOLN: 1.0000 | Freq: Every day | NASAL | 12 refills | Status: DC

## 2017-06-17 MED ORDER — LINACLOTIDE 145 MCG PO CAPS: 145.0000 ug | ORAL_CAPSULE | Freq: Every day | ORAL | 2 refills | Status: DC

## 2017-06-17 MED ORDER — POLYSPORIN 500-10000 UNIT/GM EX OINT
1.0000 "application " | TOPICAL_OINTMENT | Freq: Two times a day (BID) | CUTANEOUS | Status: DC
  Administered 2017-06-17: 1 via CUTANEOUS
  Filled 2017-06-17 (×5): qty 1

## 2017-06-17 NOTE — Discharge Summary (Signed)
Physician Discharge Summary  Deanna Holloway UXL:[id number] DOB: [birth year] DOA: 06/12/2017  PCP: System, Pcp Not In  Admit date: 06/12/2017 Discharge date: 06/17/2017  Time spent: 45 minutes  Recommendations for Outpatient Follow-up:  -Will be discharged home today. -Home health services have been arranged. -We will need follow-up with oncology and with general surgery. -Needs follow-up with urology for voiding trial due to acute urinary retention requiring Foley catheter placement.  Discharge Diagnoses:  Active Problems:   Hypercalcemia   Multiple myeloma (HCC)   Multiple myeloma not having achieved remission (HCC)   Hypokalemia   CKD (chronic kidney disease) stage 3, GFR 30-59 ml/min (HCC)   Fecal impaction (HCC)   Obstipation   Hematochezia   Hypercalcemia of malignancy   Colovaginal fistula   Disorder of perianal skin   Discharge Condition: Stable and improved  Filed Weights   06/12/17 1141 06/12/17 2100 06/16/17 1255  Weight: 50.3 kg (111 lb) 53.5 kg (118 lb) 53.5 kg (118 lb)    History of present illness:  As per Dr. Wynelle Cleveland on 3/18: Deanna Holloway is a 72 y.o. female with medical history of multiple myeloma on Velcade, hypercalcemia, hypokalemia, constipation who presents for rectal pain thought to be due to hemorrhoids. She states she was taking daily Miralax for constipation but stopped it about 2-3 days ago because stool and urine were both constantly leaking out of her. She felt pressure in her lower abdomen but has not had abdominal pain.  She has been eating ok and trying to drink a lot of fluids. She was seen at the oncology office last week and received 2 U of blood. 3 days later she was seen again and was told her Calcium was 14 and was given an IV medication. Later was told that calcium had improved to 12.     Hospital Course:   ??Rectovaginal fistula -After exam today and has been determined that patient does not have a rectovaginal  fistula. -However her anal tissue is very friable with significant skin color changes, Dr. [name] is concerned that she might have spread of her multiple myeloma to this area, biopsies have been done and are pending at this time.  Will need follow-up with oncology once biopsies are final to determine plan of action.  Fecal impaction/obstipation -Had significant stool disimpaction during flex sig. -Continue Linzess and stool softeners.  Acute urinary retention -Attempted voiding trial and she failed. -We will replace Foley catheter, will need urology follow-up upon discharge.  Hypercalcemia -Due to malignancy, patient has a history of multiple myeloma. -Calcium is to 10 today.   -Received bisphosphonate on 3/19. -Counseled on importance of oral hydration after discharge. -Continue intranasal calcitonin  Multiple myeloma, not in remission -Apparently patient refused her last dose of Velcade and dexamethasone. -She follows up at the [place] center. -Especially with outstanding biopsies, will need close follow-up with oncology, patient and significant other are aware.  Thrombocytopenia -Platelet count remains stable at around 120,000. -Secondary to ongoing malignancy.  Hypokalemia -Potassium 3.8 on discharge  Chronic kidney disease stage III -Baseline creatinine between 1.2 and 1.5 -Remains at baseline at 1.24     Procedures:  As above  Consultations:  General surgery  GYN  GI  Discharge Instructions  Discharge Instructions    Diet - low sodium heart healthy   Complete by:  As directed    Increase activity slowly   Complete by:  As directed      Allergies as of 06/17/2017  Reactions   Lasix [furosemide] Swelling   Adhesive [tape] Rash   Other    Antihistamine   Velcade [bortezomib]    Other.  Adverse reaction; POA states "and steroid that went with Velcade."   Nickel Rash      Medication List    STOP taking these medications    dexamethasone 4 MG tablet Commonly known as:  DECADRON   Lactulose 20 GM/30ML Soln     TAKE these medications   acetaminophen 500 MG tablet Commonly known as:  TYLENOL Take 500 mg by mouth every 6 (six) hours as needed.   acidophilus Caps capsule Take 1 capsule by mouth daily.   calcitonin (salmon) 200 UNIT/ACT nasal spray Commonly known as:  MIACALCIN/FORTICAL Place 1 spray into alternate nostrils daily. Start taking on:  06/18/2017   linaclotide 145 MCG Caps capsule Commonly known as:  LINZESS Take 1 capsule (145 mcg total) by mouth daily before breakfast. Start taking on:  06/18/2017   Magnesium 200 MG Tabs Take 1 tablet by mouth 2 (two) times daily.   multivitamin with minerals tablet Take 2 tablets by mouth daily.   POLYSPORIN 500-10000 UNIT/GM Oint Apply 1 application topically 2 (two) times daily.   vitamin E 1000 UNIT capsule Take 1,000 Units by mouth daily.      Allergies  Allergen Reactions  . Lasix [Furosemide] Swelling  . Adhesive [Tape] Rash  . Other     Antihistamine   . Velcade [Bortezomib]     Other.  Adverse reaction; POA states "and steroid that went with Velcade."  . Nickel Rash   Follow-up Information    [facility].   Specialty:  Home Health Services Why:  RN and Nursing assistant will contact you to schedule visits.  Contact information: [address] [place] [address] [phone]            The results of significant diagnostics from this hospitalization (including imaging, microbiology, ancillary and laboratory) are listed below for reference.    Significant Diagnostic Studies: Ct Pelvis Wo Contrast  Result Date: 06/14/2017 CLINICAL DATA:  Nausea and constipation.  Evaluate for fistula. EXAM: CT PELVIS WITHOUT CONTRAST TECHNIQUE: Multidetector CT imaging of the pelvis was performed following the standard protocol without intravenous contrast. COMPARISON:  04/06/2017 FINDINGS: Urinary  Tract:  Bladder decompressed by Foley catheter. Bowel: Enema tip is identified in the rectum with large stool volume in the rectum. Rectum is opacified consistent with retrograde placement of rectal contrast material. Sagittal image 55 of series 5 shows a thin linear tract of contrast passing from the anterior wall of the rectum to the posterior wall the vagina. The vagina is filled with gas and debris and contrast material is seen in the upper vaginal vault. There is more contrast in the upper vagina than in the lower vagina suggesting migration of contrast through a fistula rather than reflux of contrast into the vagina. Vascular/Lymphatic: Unremarkable. Reproductive:  As above. Other:  No substantial intraperitoneal free fluid. Musculoskeletal: Diffuse body wall edema evident. Stable appearance of heterogeneous bone mineralization. IMPRESSION: Imaging features compatible with rectovaginal fistula. The fistula track is demonstrated on sagittal reconstructions. Diffuse body wall edema. Electronically Signed   By: Misty Stanley M.D.   On: 06/14/2017 10:18   Dg Abdomen Acute W/chest  Result Date: 06/12/2017 CLINICAL DATA:  72 year old female with nausea and constipation. Multiple myeloma. EXAM: DG ABDOMEN ACUTE W/ 1V CHEST COMPARISON:  CT Abdomen and Pelvis 04/06/2017 and earlier. FINDINGS: AP upright  view of the chest. The lungs appear clear. No pneumothorax or pneumoperitoneum. Stable cardiac size and mediastinal contours. Visualized tracheal air column is within normal limits. Rounded masslike soft tissue contour projecting into the lower abdomen from the pelvis when correlated with the January CT Abdomen and Pelvis likely represents severe urinary bladder distension. Prominent gas-filled transverse colon, but overall nonobstructed bowel-gas pattern. Retained stool in the rectum. Diffuse severe osteopenia in keeping with widespread multiple myeloma involvement. Probable pathologic fracture of the right lateral  6th rib. Stable proximal left humerus deformity. IMPRESSION: 1. Severe distension of the urinary bladder suspected. Ultrasound could confirm. 2. Nonobstructed bowel gas pattern, but possible fecal impaction, and moderate retained stool elsewhere in the colon. 3. No free air. 4.  No acute cardiopulmonary abnormality. 5. Diffuse skeletal multiple myeloma. Electronically Signed   By: Genevie Ann M.D.   On: 06/12/2017 14:06   Dg Abd Portable 1v  Result Date: 06/13/2017 CLINICAL DATA:  Fecal impaction. EXAM: PORTABLE ABDOMEN - 1 VIEW COMPARISON:  Radiographs yesterday. FINDINGS: Decreased pelvic soft tissue prominence, likely decompressed urinary bladder. Stool distending the rectum persists, no significant interval change. Similar moderate stool burden in the more proximal colon. Slight increase in gaseous colonic distention. No small bowel dilatation or evidence of obstruction. No evidence of free air on supine view. Diffuse bony under mineralization. IMPRESSION: Similar rectal distention with stool suspicious for fecal impaction. Similar stool burden proximally, with slight increase gaseous colonic distention. No small bowel dilatation or obstruction. Electronically Signed   By: Jeb Levering M.D.   On: 06/13/2017 06:11    Microbiology: No results found for this or any previous visit (from the past 240 hour(s)).   Labs: Basic Metabolic Panel: Recent Labs  Lab 06/14/17 0504 06/15/17 0600 06/16/17 0637 06/16/17 1940 06/17/17 0621  NA 136 134* 134* 139 137  K 3.7 3.5 3.6 4.2 3.8  CL 103 102 103 107 106  CO2 '24 22 23 23 ' 21*  GLUCOSE 86 72 95 91 89  BUN 24* 22* 21* 22* 25*  CREATININE 1.28* 1.23* 1.22* 1.21* 1.24*  CALCIUM 11.0* 10.6* 10.6* 10.6* 10.1   Liver Function Tests: Recent Labs  Lab 06/12/17 1218 06/13/17 0601 06/14/17 0504 06/16/17 0637  AST '27 22 21 21  ' ALT 17 13* 12* 12*  ALKPHOS 97 84 77 78  BILITOT 0.7 0.6 0.5 0.3  PROT 7.9 6.8 6.7 7.1  ALBUMIN 2.5* 2.0* 1.9* 2.0*   No  results for input(s): LIPASE, AMYLASE in the last 168 hours. No results for input(s): AMMONIA in the last 168 hours. CBC: Recent Labs  Lab 06/12/17 1218 06/13/17 0601 06/13/17 1219 06/14/17 0504 06/15/17 0600 06/16/17 1940 06/17/17 0621  WBC 5.2 4.5  --  4.3 4.1 6.2 7.4  NEUTROABS 4.2 3.3  --  3.2 2.8 4.8  --   HGB 9.2* 8.1* 8.4* 8.0* 8.5* 11.7* 10.3*  HCT 27.9* 24.5* 25.8* 24.6* 25.7* 35.0* 31.3*  MCV 93.0 94.2  --  94.6 95.5 90.7 91.3  PLT 124* 110*  --  117* 124* 117* 116*   Cardiac Enzymes: No results for input(s): CKTOTAL, CKMB, CKMBINDEX, TROPONINI in the last 168 hours. BNP: BNP (last 3 results) No results for input(s): BNP in the last 8760 hours.  ProBNP (last 3 results) No results for input(s): PROBNP in the last 8760 hours.  CBG: No results for input(s): GLUCAP in the last 168 hours.     Signed:  Lelon Frohlich  [facility] Pager: [phone] 06/17/2017, 6:29 PM

## 2017-06-17 NOTE — Progress Notes (Signed)
[facility] Progress Note  1 Day Post-Op  Subjective: No major issues. Having pain. Expected. H&H up with blood and equilibrated out on AM labs.   Objective: Vital signs in last 24 hours: Temp:  [97.6 F (36.4 C)-100.5 F (38.1 C)] 98.7 F (37.1 C) (03/23 0500) Pulse Rate:  [79-107] 91 (03/23 0900) Resp:  [0-19] 18 (03/23 0900) BP: (87-124)/(42-76) 87/42 (03/23 0900) SpO2:  [94 %-100 %] 95 % (03/23 0900) Weight:  [118 lb (53.5 kg)] 118 lb (53.5 kg) (03/22 1255) Last BM Date: 06/16/17  Intake/Output from previous day: 03/22 0701 - 03/23 0700 In: 715 [I.V.:400; Blood:315] Out: 1400 [Urine:1150; Blood:250] Intake/Output this shift: Total I/O In: 240 [P.O.:240] Out: -   General appearance: alert, cooperative and no distress Resp: normal work breathing Extremities: extremities normal, atraumatic, no cyanosis or edema anal exam with continued lesions, some serosanginous discharge, packing removed from anus, no gross blood   Lab Results:  Recent Labs    06/16/17 1940 06/17/17 0621  WBC 6.2 7.4  HGB 11.7* 10.3*  HCT 35.0* 31.3*  PLT 117* 116*   BMET Recent Labs    06/16/17 1940 06/17/17 0621  NA 139 137  K 4.2 3.8  CL 107 106  CO2 23 21*  GLUCOSE 91 89  BUN 22* 25*  CREATININE 1.21* 1.24*  CALCIUM 10.6* 10.1   PT/INR Recent Labs    06/16/17 1940  LABPROT 13.4  INR 1.03   Anti-infectives: Anti-infectives (From admission, onward)   Start     Dose/Rate Route Frequency Ordered Stop   06/16/17 0600  ceFAZolin (ANCEF) IVPB 2g/100 mL premix     2 g 200 mL/hr over 30 Minutes Intravenous On call to O.R. 06/15/17 1848 06/16/17 1357      Assessment/Plan: Ms. Wilburn Cornelia POD 1 s/p EUA with flex sig, disimpaction, Gyn exam by Dr. Glo Herring, and biopsies of the perianal skin. Doing fair. Packing removed. Area cleaned. Serosangious drainage. PRN for pain Expect some drainage/ blood from the bottom Bacitracin TID to the perianal area Recommend  Home health  Pathology pending Will need to follow up with me Thursday 06/22/2017    LOS: 4 days    Virl Cagey 06/17/2017

## 2017-06-17 NOTE — Addendum Note (Signed)
Addendum  created 06/17/17 1020 by Charmaine Downs, CRNA   Sign clinical note

## 2017-06-17 NOTE — Care Management Note (Signed)
Case Management Note  Patient Details  Name: Deanna Holloway MRN: [id number] Date of Birth: 30-Mar-1945  Subjective/Objective:    Pt presented from home for multiple myeloma.  Pt to return home with family friend, [name], who is her POA.  Pt has [place] ordered but is not interested in PT.  Pt states she is too weak for PT and knows it will not help.           Action/Plan: Pt's POA, [name], was contacted because patient told nurse she wanted him to make the decisions.  When [facility] services described to [name], he states they were not interested in therapy as above.  Discussed home hospice service with [name], which he was interested in.  Spoke with patient and [name] on speaker phone and patient declined home hospice services because she didn't want her medications to be changed.  They elected to go with [facility] but do not want PT.  They have no preference of agency and agree with [facility].  Jermaine with [facility] contacted for referral.     Expected Discharge Date:  06/17/17               Expected Discharge Plan:  Home/Self Care  In-House Referral:  NA  Discharge planning Services  CM Consult  Post Acute Care Choice:  NA Choice offered to:  NA  DME Arranged:  N/A DME Agency:  NA  HH Arranged:  RN, Nurse's Aide [place] Agency:  [place]  Status of Service:  Completed, signed off  If discussed at [place] of Stay Meetings, dates discussed:    Additional Comments:  Claudie Leach, RN 06/17/2017, 4:30 PM

## 2017-06-17 NOTE — Progress Notes (Addendum)
Patient up to bedside commode.  Patient unable to void.  Dr. Jerilee Hoh notified via text page.    Late entry:  Dr. Jerilee Hoh returned page and gave order to reinsert foley.

## 2017-06-17 NOTE — Progress Notes (Signed)
Patient has not voided since foley removed at 1400.  Dr. Jerilee Hoh notified.  Bladder scan to be completed.  Bladder can = approx 500 ml

## 2017-06-17 NOTE — Anesthesia Postprocedure Evaluation (Signed)
Anesthesia Post Note  Patient: Deanna Holloway  Procedure(s) Performed: EXAM UNDER ANESTHESIA WITH FLEXIBLE SIGMOIDOSCOPY (1427 Scope in; 1459 scope out) (N/A ) [place] UNDER ANESTHESIA  Patient location during evaluation: Nursing Unit Anesthesia Type: General Level of consciousness: awake and alert and patient cooperative Pain management: pain level controlled Vital Signs Assessment: post-procedure vital signs reviewed and stable Respiratory status: spontaneous breathing, nonlabored ventilation and respiratory function stable Cardiovascular status: blood pressure returned to baseline Postop Assessment: no apparent nausea or vomiting Anesthetic complications: no     Last Vitals:  Vitals:   06/17/17 0500 06/17/17 0900  BP: (!) 94/50 (!) 87/42  Pulse: (!) 105 91  Resp:  18  Temp: 37.1 C   SpO2: 97% 95%    Last Pain:  Vitals:   06/17/17 0500  TempSrc: Axillary  PainSc:                  [name] J

## 2017-06-17 NOTE — Discharge Instructions (Signed)
Bacitracin ointment to rectal area TID

## 2017-06-17 NOTE — Progress Notes (Signed)
Late entry:  Caryl Pina, Case Manager at [facility], notified of patient's [facility] order.

## 2017-06-17 NOTE — Progress Notes (Signed)
Dr. Jerilee Hoh notified that case manager, Caryl Pina, stated that home health would need to be notified by the patient's PCP which has been the oncologist at [facility].  Dr. Jerilee Hoh stated that Dr. [name] could sign the paperwork for home health and to continue with discharge tonight.  Patient and patient's caregiver notified that patient will be discharged tonight and verbalized understanding.  Oncoming RN notified of patient's discharge.

## 2017-06-17 NOTE — Progress Notes (Signed)
Discharge papers given. Instructions on how to care for foley given. Patient and Deanna Holloway, patient's POA, stated understanding of instructions. POA and patient stated they will not be taking prescribed medicine except for the nasal spray. Day shift RN to call and update POA with information about home health.  Margaret Pyle, RN

## 2017-06-17 NOTE — Progress Notes (Signed)
Dr. Jerilee Hoh gave order to discontinue foley catheter prior to discharge.

## 2017-06-17 NOTE — Progress Notes (Signed)
Per caregiver, [name], the social worker, Caryl Pina from [employer] must verify with PCP that they will verify the [facility] order.  Dr. Jerilee Hoh notified via text page.

## 2017-06-19 ENCOUNTER — Encounter (HOSPITAL_COMMUNITY): Payer: Self-pay | Admitting: General Surgery

## 2017-06-19 DIAGNOSIS — R339 Retention of urine, unspecified: Secondary | ICD-10-CM | POA: Diagnosis not present

## 2017-06-19 DIAGNOSIS — K5909 Other constipation: Secondary | ICD-10-CM | POA: Diagnosis not present

## 2017-06-19 DIAGNOSIS — E43 Unspecified severe protein-calorie malnutrition: Secondary | ICD-10-CM | POA: Diagnosis not present

## 2017-06-19 DIAGNOSIS — Z466 Encounter for fitting and adjustment of urinary device: Secondary | ICD-10-CM | POA: Diagnosis not present

## 2017-06-19 DIAGNOSIS — N183 Chronic kidney disease, stage 3 (moderate): Secondary | ICD-10-CM | POA: Diagnosis not present

## 2017-06-19 DIAGNOSIS — K449 Diaphragmatic hernia without obstruction or gangrene: Secondary | ICD-10-CM | POA: Diagnosis not present

## 2017-06-19 DIAGNOSIS — C9000 Multiple myeloma not having achieved remission: Secondary | ICD-10-CM | POA: Diagnosis not present

## 2017-06-19 DIAGNOSIS — K6289 Other specified diseases of anus and rectum: Secondary | ICD-10-CM | POA: Diagnosis not present

## 2017-06-19 DIAGNOSIS — K5790 Diverticulosis of intestine, part unspecified, without perforation or abscess without bleeding: Secondary | ICD-10-CM | POA: Diagnosis not present

## 2017-06-19 NOTE — Addendum Note (Signed)
Addendum  created 06/19/17 0936 by Mickel Baas, CRNA   Charge Capture section accepted

## 2017-06-20 ENCOUNTER — Ambulatory Visit (HOSPITAL_COMMUNITY): Payer: Medicare Other

## 2017-06-20 ENCOUNTER — Ambulatory Visit (HOSPITAL_COMMUNITY): Payer: Medicare Other | Admitting: Hematology

## 2017-06-20 ENCOUNTER — Ambulatory Visit (HOSPITAL_COMMUNITY): Payer: Medicare Other | Admitting: Internal Medicine

## 2017-06-20 ENCOUNTER — Other Ambulatory Visit (HOSPITAL_COMMUNITY): Payer: Medicare Other

## 2017-06-20 DIAGNOSIS — K5790 Diverticulosis of intestine, part unspecified, without perforation or abscess without bleeding: Secondary | ICD-10-CM | POA: Diagnosis not present

## 2017-06-20 DIAGNOSIS — R339 Retention of urine, unspecified: Secondary | ICD-10-CM | POA: Diagnosis not present

## 2017-06-20 DIAGNOSIS — K6289 Other specified diseases of anus and rectum: Secondary | ICD-10-CM | POA: Diagnosis not present

## 2017-06-20 DIAGNOSIS — K5909 Other constipation: Secondary | ICD-10-CM | POA: Diagnosis not present

## 2017-06-20 DIAGNOSIS — C9000 Multiple myeloma not having achieved remission: Secondary | ICD-10-CM | POA: Diagnosis not present

## 2017-06-20 MED FILL — Bacitracin-Polymyxin B Oint: CUTANEOUS | Qty: 1 | Status: AC

## 2017-06-21 ENCOUNTER — Other Ambulatory Visit (HOSPITAL_COMMUNITY): Payer: Self-pay | Admitting: Adult Health

## 2017-06-21 ENCOUNTER — Encounter (HOSPITAL_COMMUNITY): Payer: Self-pay | Admitting: Adult Health

## 2017-06-21 DIAGNOSIS — E859 Amyloidosis, unspecified: Secondary | ICD-10-CM

## 2017-06-21 DIAGNOSIS — C9000 Multiple myeloma not having achieved remission: Secondary | ICD-10-CM

## 2017-06-21 NOTE — Progress Notes (Signed)
Patient's case was discussed with Dr. [name] (general surgery) and with Dr. Delton Coombes today.  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    Per Dr. Delton Coombes, will work on completing whole body PET scan for her multiple myeloma, as well as cardiac MRI for amyloid deposition.  She will need systemic treatment (which has been challenging in the past d/t patient's refusal of medical intervention).   Dr. [name] to call patient tomorrow (3/28) to discuss recent biopsy results and plan of care with her.  I have placed orders for PET scan and cardiac MRI, as well as sent a scheduling message to our team to help get scans scheduled (if patient agrees).  Will also make arrangements for follow-up visit here at the cancer center a few days after scans (again, if pt agrees).    Mike Craze, NP [place] [phone]

## 2017-06-22 ENCOUNTER — Telehealth: Payer: Self-pay | Admitting: General Surgery

## 2017-06-22 DIAGNOSIS — K5790 Diverticulosis of intestine, part unspecified, without perforation or abscess without bleeding: Secondary | ICD-10-CM | POA: Diagnosis not present

## 2017-06-22 DIAGNOSIS — K6289 Other specified diseases of anus and rectum: Secondary | ICD-10-CM | POA: Diagnosis not present

## 2017-06-22 DIAGNOSIS — K5909 Other constipation: Secondary | ICD-10-CM | POA: Diagnosis not present

## 2017-06-22 DIAGNOSIS — C9000 Multiple myeloma not having achieved remission: Secondary | ICD-10-CM | POA: Diagnosis not present

## 2017-06-22 DIAGNOSIS — R339 Retention of urine, unspecified: Secondary | ICD-10-CM | POA: Diagnosis not present

## 2017-06-22 NOTE — Telephone Encounter (Signed)
[facility]  Spoke with Home health RN, Elray Mcgregor, regarding hospice care.  I will need to call and refer to hospice to get them to at least discuss with options with the patient.  Lucky Crismon # [phone] At [facility] might be able to help with Hospice.   [name], MD [facility] [address] [place], [place] [address] [phone] (office)

## 2017-06-22 NOTE — Telephone Encounter (Signed)
[facility]  Called to inform patient and POA, Saverio Danker, of the biopsy results that came back yesterday afternoon.  Biopsy with amyloid which is consistent with the multiple myeloma.   Have discussed the findings with Dr. Delton Coombes and Mike Craze at [facility] and they to confirm that this is consistent with the multiple myeloma.   Patient is very weak per the POA. He says he is not going to be taking her to the cardiac MRI and the PET scan. He is saying that transporting her is very difficult. The POA and patient refused SNF when offered in the hospital.  They also say that the refused physical therapy, but they keep saying she is "sensitive and weak" and that they cannot do anything.   As far as her perianal region, the RNs have been coming from [place] and a private RN has been coming per the [place], [employer].  He does not report anything bad and that they are doing some Desitin. The Desitin is helping with comfort and I told them that is fine but that this will not help the lesion on the area.  I am not going to be able to do any surgeries given the risk of the amyloid throughout her body and the issues with healing and also given the possibility of the cardiac amyloid.    Given what they have described to me and the weakness. I have warned that has she continues to get weaker that she may ultimately need to go back to the Hospital and may need Hospice care if she does not want to pursue any treatments or further workup or imaging.   Dr. Dr. Velvet Bathe A. Hasanaj, MD is the PCP per [name] Report, but she has not seen him in 1 year.   I told them that Oncology will be getting in touch to discuss the PET and Cardiac MRI. I do recommend that they get these imaging. But if she is not wanting anymore treatment, Hospice might be the right answer.  I will send word to Dr. Sherrie Sport to see if he can help the patient and to Oncology regarding Home Hospice. As of now, I am writing  orders for [facility], but will ask for one of the medical doctors to take that over once they are back in the loop.   [name], MD [facility] [address] [place], [place] [address] [phone] (office)

## 2017-06-23 ENCOUNTER — Telehealth: Payer: Self-pay | Admitting: General Surgery

## 2017-06-23 NOTE — Telephone Encounter (Signed)
[facility]  Discussed hospice as an option with Cassandra, the RN at [facility] in [place].   I will fax a demographic sheet, my last notes, and biopsy results to [phone].   Discussed that this would be an option for the patient to determine if hospice is appropriate for her.  Patient has been refusing referral for further treatment.   Updated patient and [name] regarding referral. Discussed that the foley may need to stay due to comfort. If that is the case, no need to referring to urology.   Will send Dr. Telford Nab a note regarding everything that is being discussed. Hospice said that they would reach out to him as her last PCP with regards to the hospice care.   [name], MD [facility] [address] [place], [place] [address] [phone] (office)

## 2017-06-23 NOTE — Telephone Encounter (Signed)
[facility]  Called to Update Dr. Zada Girt. Hospice has already called about being PCP. RN to give him message if he wants any information from me.   [name], MD [facility] [address] [place], [place] [address] [phone] (office)

## 2017-06-25 DIAGNOSIS — K5909 Other constipation: Secondary | ICD-10-CM | POA: Diagnosis not present

## 2017-06-25 DIAGNOSIS — C9000 Multiple myeloma not having achieved remission: Secondary | ICD-10-CM | POA: Diagnosis not present

## 2017-06-25 DIAGNOSIS — N390 Urinary tract infection, site not specified: Secondary | ICD-10-CM | POA: Diagnosis not present

## 2017-06-25 DIAGNOSIS — K6289 Other specified diseases of anus and rectum: Secondary | ICD-10-CM | POA: Diagnosis not present

## 2017-06-25 DIAGNOSIS — R339 Retention of urine, unspecified: Secondary | ICD-10-CM | POA: Diagnosis not present

## 2017-06-25 DIAGNOSIS — K5790 Diverticulosis of intestine, part unspecified, without perforation or abscess without bleeding: Secondary | ICD-10-CM | POA: Diagnosis not present

## 2017-06-27 ENCOUNTER — Telehealth: Payer: Self-pay | Admitting: General Surgery

## 2017-06-27 DIAGNOSIS — K6289 Other specified diseases of anus and rectum: Secondary | ICD-10-CM | POA: Diagnosis not present

## 2017-06-27 DIAGNOSIS — C9000 Multiple myeloma not having achieved remission: Secondary | ICD-10-CM | POA: Diagnosis not present

## 2017-06-27 DIAGNOSIS — R339 Retention of urine, unspecified: Secondary | ICD-10-CM | POA: Diagnosis not present

## 2017-06-27 DIAGNOSIS — K5790 Diverticulosis of intestine, part unspecified, without perforation or abscess without bleeding: Secondary | ICD-10-CM | POA: Diagnosis not present

## 2017-06-27 DIAGNOSIS — K5909 Other constipation: Secondary | ICD-10-CM | POA: Diagnosis not present

## 2017-06-27 NOTE — Telephone Encounter (Signed)
Deanna Holloway notified me that they had seen the patient. Patient does not wish to pursue this now.   She has noted some stitches but these will dissolve without issues and this was from the biopsy site. It will take a few weeks for it to dissolve.  Her bottom is doing ok.    RNs are seeing her at home.   Sees oncology on Friday?   PCP, Dr. Zada Girt, should be seeing her some time soon? She reports no strength to be moved. May not be going to these appointments.   [place] is still seeing her for now. If continues to get weak, may have to go to SNF.   [name], MD [facility] [address] [place], [place] [address] [phone] (office)

## 2017-06-29 ENCOUNTER — Emergency Department (HOSPITAL_COMMUNITY): Payer: Medicare Other

## 2017-06-29 ENCOUNTER — Other Ambulatory Visit: Payer: Self-pay

## 2017-06-29 ENCOUNTER — Inpatient Hospital Stay (HOSPITAL_COMMUNITY)
Admission: EM | Admit: 2017-06-29 | Discharge: 2017-07-05 | DRG: 698 | Disposition: A | Discharge status: Home / Self Care | Payer: Medicare Other | Attending: Internal Medicine | Admitting: Internal Medicine

## 2017-06-29 ENCOUNTER — Encounter (HOSPITAL_COMMUNITY): Payer: Self-pay

## 2017-06-29 DIAGNOSIS — N183 Chronic kidney disease, stage 3 unspecified: Secondary | ICD-10-CM | POA: Diagnosis present

## 2017-06-29 DIAGNOSIS — N3000 Acute cystitis without hematuria: Secondary | ICD-10-CM | POA: Diagnosis not present

## 2017-06-29 DIAGNOSIS — Z7189 Other specified counseling: Secondary | ICD-10-CM

## 2017-06-29 DIAGNOSIS — R339 Retention of urine, unspecified: Secondary | ICD-10-CM | POA: Diagnosis not present

## 2017-06-29 DIAGNOSIS — R748 Abnormal levels of other serum enzymes: Secondary | ICD-10-CM | POA: Diagnosis not present

## 2017-06-29 DIAGNOSIS — E871 Hypo-osmolality and hyponatremia: Secondary | ICD-10-CM | POA: Diagnosis not present

## 2017-06-29 DIAGNOSIS — E876 Hypokalemia: Secondary | ICD-10-CM | POA: Diagnosis not present

## 2017-06-29 DIAGNOSIS — K5790 Diverticulosis of intestine, part unspecified, without perforation or abscess without bleeding: Secondary | ICD-10-CM | POA: Diagnosis not present

## 2017-06-29 DIAGNOSIS — I519 Heart disease, unspecified: Secondary | ICD-10-CM | POA: Diagnosis not present

## 2017-06-29 DIAGNOSIS — Z8249 Family history of ischemic heart disease and other diseases of the circulatory system: Secondary | ICD-10-CM | POA: Diagnosis not present

## 2017-06-29 DIAGNOSIS — I5030 Unspecified diastolic (congestive) heart failure: Secondary | ICD-10-CM | POA: Diagnosis not present

## 2017-06-29 DIAGNOSIS — Z5320 Procedure and treatment not carried out because of patient's decision for unspecified reasons: Secondary | ICD-10-CM | POA: Diagnosis present

## 2017-06-29 DIAGNOSIS — L8992 Pressure ulcer of unspecified site, stage 2: Secondary | ICD-10-CM | POA: Diagnosis present

## 2017-06-29 DIAGNOSIS — R7989 Other specified abnormal findings of blood chemistry: Secondary | ICD-10-CM

## 2017-06-29 DIAGNOSIS — N179 Acute kidney failure, unspecified: Secondary | ICD-10-CM | POA: Diagnosis present

## 2017-06-29 DIAGNOSIS — R778 Other specified abnormalities of plasma proteins: Secondary | ICD-10-CM

## 2017-06-29 DIAGNOSIS — Z66 Do not resuscitate: Secondary | ICD-10-CM | POA: Diagnosis present

## 2017-06-29 DIAGNOSIS — R531 Weakness: Secondary | ICD-10-CM | POA: Diagnosis not present

## 2017-06-29 DIAGNOSIS — E859 Amyloidosis, unspecified: Secondary | ICD-10-CM | POA: Diagnosis present

## 2017-06-29 DIAGNOSIS — E43 Unspecified severe protein-calorie malnutrition: Secondary | ICD-10-CM | POA: Diagnosis present

## 2017-06-29 DIAGNOSIS — E8589 Other amyloidosis: Secondary | ICD-10-CM | POA: Diagnosis not present

## 2017-06-29 DIAGNOSIS — G9341 Metabolic encephalopathy: Secondary | ICD-10-CM | POA: Diagnosis present

## 2017-06-29 DIAGNOSIS — I129 Hypertensive chronic kidney disease with stage 1 through stage 4 chronic kidney disease, or unspecified chronic kidney disease: Secondary | ICD-10-CM | POA: Diagnosis present

## 2017-06-29 DIAGNOSIS — K6289 Other specified diseases of anus and rectum: Secondary | ICD-10-CM | POA: Diagnosis not present

## 2017-06-29 DIAGNOSIS — Z7401 Bed confinement status: Secondary | ICD-10-CM | POA: Diagnosis not present

## 2017-06-29 DIAGNOSIS — Z9109 Other allergy status, other than to drugs and biological substances: Secondary | ICD-10-CM | POA: Diagnosis not present

## 2017-06-29 DIAGNOSIS — I959 Hypotension, unspecified: Secondary | ICD-10-CM | POA: Diagnosis present

## 2017-06-29 DIAGNOSIS — Z91048 Other nonmedicinal substance allergy status: Secondary | ICD-10-CM

## 2017-06-29 DIAGNOSIS — Z681 Body mass index (BMI) 19 or less, adult: Secondary | ICD-10-CM

## 2017-06-29 DIAGNOSIS — C9000 Multiple myeloma not having achieved remission: Secondary | ICD-10-CM

## 2017-06-29 DIAGNOSIS — K5909 Other constipation: Secondary | ICD-10-CM | POA: Diagnosis not present

## 2017-06-29 DIAGNOSIS — R279 Unspecified lack of coordination: Secondary | ICD-10-CM | POA: Diagnosis not present

## 2017-06-29 DIAGNOSIS — R1030 Lower abdominal pain, unspecified: Secondary | ICD-10-CM | POA: Diagnosis not present

## 2017-06-29 DIAGNOSIS — Z888 Allergy status to other drugs, medicaments and biological substances status: Secondary | ICD-10-CM | POA: Diagnosis not present

## 2017-06-29 DIAGNOSIS — R404 Transient alteration of awareness: Secondary | ICD-10-CM | POA: Diagnosis not present

## 2017-06-29 DIAGNOSIS — I248 Other forms of acute ischemic heart disease: Secondary | ICD-10-CM | POA: Diagnosis not present

## 2017-06-29 DIAGNOSIS — E860 Dehydration: Secondary | ICD-10-CM | POA: Diagnosis present

## 2017-06-29 DIAGNOSIS — Z515 Encounter for palliative care: Secondary | ICD-10-CM | POA: Diagnosis not present

## 2017-06-29 DIAGNOSIS — D61818 Other pancytopenia: Secondary | ICD-10-CM | POA: Diagnosis not present

## 2017-06-29 DIAGNOSIS — N390 Urinary tract infection, site not specified: Secondary | ICD-10-CM

## 2017-06-29 DIAGNOSIS — R109 Unspecified abdominal pain: Secondary | ICD-10-CM | POA: Diagnosis not present

## 2017-06-29 DIAGNOSIS — E861 Hypovolemia: Secondary | ICD-10-CM | POA: Diagnosis not present

## 2017-06-29 DIAGNOSIS — B965 Pseudomonas (aeruginosa) (mallei) (pseudomallei) as the cause of diseases classified elsewhere: Secondary | ICD-10-CM | POA: Diagnosis present

## 2017-06-29 DIAGNOSIS — N139 Obstructive and reflux uropathy, unspecified: Secondary | ICD-10-CM | POA: Diagnosis present

## 2017-06-29 DIAGNOSIS — C9001 Multiple myeloma in remission: Secondary | ICD-10-CM | POA: Diagnosis present

## 2017-06-29 DIAGNOSIS — R636 Underweight: Secondary | ICD-10-CM | POA: Diagnosis present

## 2017-06-29 DIAGNOSIS — T83511A Infection and inflammatory reaction due to indwelling urethral catheter, initial encounter: Principal | ICD-10-CM | POA: Diagnosis present

## 2017-06-29 DIAGNOSIS — I5189 Other ill-defined heart diseases: Secondary | ICD-10-CM | POA: Diagnosis present

## 2017-06-29 HISTORY — DX: Heart disease, unspecified: I51.9

## 2017-06-29 HISTORY — DX: Essential (primary) hypertension: I10

## 2017-06-29 LAB — URINALYSIS, ROUTINE W REFLEX MICROSCOPIC
Bilirubin Urine: NEGATIVE
Glucose, UA: NEGATIVE mg/dL
Ketones, ur: NEGATIVE mg/dL
Nitrite: NEGATIVE
PROTEIN: 30 mg/dL — AB
SPECIFIC GRAVITY, URINE: 1.008 (ref 1.005–1.030)
SQUAMOUS EPITHELIAL / LPF: NONE SEEN
pH: 7 (ref 5.0–8.0)

## 2017-06-29 LAB — CBC WITH DIFFERENTIAL/PLATELET
BASOS PCT: 0 %
Basophils Absolute: 0 10*3/uL (ref 0.0–0.1)
Eosinophils Absolute: 0 10*3/uL (ref 0.0–0.7)
Eosinophils Relative: 0 %
HEMATOCRIT: 26.7 % — AB (ref 36.0–46.0)
HEMOGLOBIN: 8.8 g/dL — AB (ref 12.0–15.0)
LYMPHS ABS: 0.8 10*3/uL (ref 0.7–4.0)
Lymphocytes Relative: 24 %
MCH: 30.2 pg (ref 26.0–34.0)
MCHC: 33 g/dL (ref 30.0–36.0)
MCV: 91.8 fL (ref 78.0–100.0)
Monocytes Absolute: 0.6 10*3/uL (ref 0.1–1.0)
Monocytes Relative: 16 %
NEUTROS ABS: 2.1 10*3/uL (ref 1.7–7.7)
NEUTROS PCT: 60 %
Platelets: 152 10*3/uL (ref 150–400)
RBC: 2.91 MIL/uL — AB (ref 3.87–5.11)
RDW: 14.8 % (ref 11.5–15.5)
WBC: 3.5 10*3/uL — AB (ref 4.0–10.5)

## 2017-06-29 LAB — COMPREHENSIVE METABOLIC PANEL
ALT: 15 U/L (ref 14–54)
ANION GAP: 9 (ref 5–15)
AST: 36 U/L (ref 15–41)
Albumin: 1.8 g/dL — ABNORMAL LOW (ref 3.5–5.0)
Alkaline Phosphatase: 72 U/L (ref 38–126)
BUN: 38 mg/dL — ABNORMAL HIGH (ref 6–20)
CHLORIDE: 95 mmol/L — AB (ref 101–111)
CO2: 23 mmol/L (ref 22–32)
Calcium: 14.9 mg/dL (ref 8.9–10.3)
Creatinine, Ser: 1.4 mg/dL — ABNORMAL HIGH (ref 0.44–1.00)
GFR, EST AFRICAN AMERICAN: 42 mL/min — AB (ref >60)
GFR, EST NON AFRICAN AMERICAN: 37 mL/min — AB (ref >60)
Glucose, Bld: 114 mg/dL — ABNORMAL HIGH (ref 65–99)
Potassium: 3.9 mmol/L (ref 3.5–5.1)
SODIUM: 127 mmol/L — AB (ref 135–145)
Total Bilirubin: 0.4 mg/dL (ref 0.3–1.2)
Total Protein: 6.8 g/dL (ref 6.5–8.1)

## 2017-06-29 LAB — I-STAT TROPONIN, ED: Troponin i, poc: 0.25 ng/mL (ref 0.00–0.08)

## 2017-06-29 LAB — I-STAT CG4 LACTIC ACID, ED
LACTIC ACID, VENOUS: 2.02 mmol/L — AB (ref 0.5–1.9)
Lactic Acid, Venous: 1.41 mmol/L (ref 0.5–1.9)

## 2017-06-29 LAB — TROPONIN I: TROPONIN I: 0.23 ng/mL — AB (ref <0.03)

## 2017-06-29 LAB — LIPASE, BLOOD: LIPASE: 23 U/L (ref 11–51)

## 2017-06-29 MED ORDER — SODIUM CHLORIDE 0.9 % IV SOLN
INTRAVENOUS | Status: DC
  Administered 2017-06-30 – 2017-07-05 (×15): via INTRAVENOUS

## 2017-06-29 MED ORDER — ACETAMINOPHEN 500 MG PO TABS
500.0000 mg | ORAL_TABLET | Freq: Four times a day (QID) | ORAL | Status: DC | PRN
Start: 2017-06-29 — End: 2017-07-05
  Administered 2017-06-30 (×2): 500 mg via ORAL
  Filled 2017-06-29 (×2): qty 1

## 2017-06-29 MED ORDER — SODIUM CHLORIDE 0.9 % IV SOLN
1.0000 g | INTRAVENOUS | Status: DC
  Filled 2017-06-29 (×3): qty 10

## 2017-06-29 MED ORDER — ONDANSETRON HCL 4 MG/2ML IJ SOLN: 4.0000 mg | Freq: Four times a day (QID) | INTRAMUSCULAR | Status: DC | PRN

## 2017-06-29 MED ORDER — SODIUM CHLORIDE 0.9 % IV SOLN
2.0000 g | Freq: Once | INTRAVENOUS | Status: AC
  Administered 2017-06-29: 2 g via INTRAVENOUS
  Filled 2017-06-29: qty 20

## 2017-06-29 MED ORDER — ENOXAPARIN SODIUM 40 MG/0.4ML ~~LOC~~ SOLN
40.0000 mg | SUBCUTANEOUS | Status: DC
  Filled 2017-06-29 (×6): qty 0.4

## 2017-06-29 MED ORDER — SODIUM CHLORIDE 0.9 % IV BOLUS
500.0000 mL | Freq: Once | INTRAVENOUS | Status: AC
  Administered 2017-06-29: 500 mL via INTRAVENOUS

## 2017-06-29 MED ORDER — SODIUM CHLORIDE 0.9 % IV BOLUS
1000.0000 mL | Freq: Once | INTRAVENOUS | Status: AC
  Administered 2017-06-29: 1000 mL via INTRAVENOUS

## 2017-06-29 MED ORDER — ONDANSETRON HCL 4 MG PO TABS
4.0000 mg | ORAL_TABLET | Freq: Four times a day (QID) | ORAL | Status: DC | PRN
Start: 2017-06-29 — End: 2017-07-05

## 2017-06-29 MED ORDER — CALCITONIN (SALMON) 200 UNIT/ACT NA SOLN
1.0000 | Freq: Every day | NASAL | Status: DC
  Filled 2017-06-29: qty 3.7
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
## 2017-06-29 NOTE — ED Notes (Signed)
CRITICAL VALUE ALERT  Critical Value:  Calcium 14.9  Date & Time Notied:  06/29/2017, 1814  Provider Notified: Dr. Laverta [place]  Orders Received/Actions taken: see chart

## 2017-06-29 NOTE — ED Notes (Signed)
CRITICAL VALUE ALERT  Critical Value:  Troponin 0.25 and Lactic 2.02  Date & Time Notied:  06/29/2017 at [place]  Provider Notified: Dr. Laverta [place]   Orders Received/Actions taken: recheck lab troponin and IV antibiotics,  Blood cultures.

## 2017-06-29 NOTE — ED Triage Notes (Signed)
EMS reports pt has multiple myeloma.   they were called out today  for weakness and hypotension.  Reports pt had colon surgery 2 weeks ago.  [facility]  Reports was told she has a bladder infection by home health and was sent here for treatment.  Pt has appt with cancer MD tomorrow.
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
## 2017-06-29 NOTE — Progress Notes (Signed)
Pharmacy Antibiotic Note  Deanna Holloway is a 72 y.o. female admitted on 06/29/2017 with UTI.  Pharmacy has been consulted for ceftriaxone dosing.  Plan: Ceftriaxone 1000 mg IV q24 hours  Monitor labs, c/s, and patient improvement  Height: 5\' 3"  (160 cm) Weight: 112 lb (50.8 kg) IBW/kg (Calculated) : 52.4  Temp (24hrs), Avg:98 F (36.7 C), Min:98 F (36.7 C), Max:98 F (36.7 C)  Recent Labs  Lab 06/29/17 1708 06/29/17 1731  WBC 3.5*  --   CREATININE 1.40*  --   LATICACIDVEN  --  2.02*    Estimated Creatinine Clearance: 29.1 mL/min (A) (by C-G formula based on SCr of 1.4 mg/dL (H)).    Allergies  Allergen Reactions  . Lasix [Furosemide] Swelling  . Adhesive [Tape] Rash  . Other     Antihistamine and steroids   . Velcade [Bortezomib]     Other.  Adverse reaction; POA states "and steroid that went with Velcade."  . Nickel Rash    Antimicrobials this admission: Ceftriaxone 4/4 >>     Dose adjustments this admission: N/A  Microbiology results: 4/4 BCx: pending 4/4 UCx: pending       Thank you for allowing pharmacy to be a part of this patient's care.  Ramond Craver 06/29/2017 7:14 PM

## 2017-06-30 ENCOUNTER — Other Ambulatory Visit: Payer: Self-pay

## 2017-06-30 ENCOUNTER — Encounter (HOSPITAL_COMMUNITY): Payer: Self-pay | Admitting: *Deleted

## 2017-06-30 ENCOUNTER — Telehealth (HOSPITAL_COMMUNITY): Payer: Self-pay

## 2017-06-30 ENCOUNTER — Ambulatory Visit (HOSPITAL_COMMUNITY): Payer: Medicare Other | Admitting: Hematology

## 2017-06-30 ENCOUNTER — Other Ambulatory Visit (HOSPITAL_COMMUNITY): Payer: Self-pay | Admitting: Adult Health

## 2017-06-30 DIAGNOSIS — Z515 Encounter for palliative care: Secondary | ICD-10-CM

## 2017-06-30 DIAGNOSIS — E8589 Other amyloidosis: Secondary | ICD-10-CM

## 2017-06-30 DIAGNOSIS — Z66 Do not resuscitate: Secondary | ICD-10-CM

## 2017-06-30 DIAGNOSIS — Z7189 Other specified counseling: Secondary | ICD-10-CM

## 2017-06-30 LAB — CBC
HCT: 26.9 % — ABNORMAL LOW (ref 36.0–46.0)
Hemoglobin: 8.9 g/dL — ABNORMAL LOW (ref 12.0–15.0)
MCH: 30.5 pg (ref 26.0–34.0)
MCHC: 33.1 g/dL (ref 30.0–36.0)
MCV: 92.1 fL (ref 78.0–100.0)
PLATELETS: 149 10*3/uL — AB (ref 150–400)
RBC: 2.92 MIL/uL — ABNORMAL LOW (ref 3.87–5.11)
RDW: 14.9 % (ref 11.5–15.5)
WBC: 3.1 10*3/uL — ABNORMAL LOW (ref 4.0–10.5)

## 2017-06-30 LAB — COMPREHENSIVE METABOLIC PANEL
ALT: 12 U/L — ABNORMAL LOW (ref 14–54)
AST: 38 U/L (ref 15–41)
Albumin: 1.8 g/dL — ABNORMAL LOW (ref 3.5–5.0)
Alkaline Phosphatase: 69 U/L (ref 38–126)
Anion gap: 11 (ref 5–15)
BUN: 35 mg/dL — AB (ref 6–20)
CHLORIDE: 99 mmol/L — AB (ref 101–111)
CO2: 24 mmol/L (ref 22–32)
Calcium: 14.4 mg/dL (ref 8.9–10.3)
Creatinine, Ser: 1.33 mg/dL — ABNORMAL HIGH (ref 0.44–1.00)
GFR calc Af Amer: 45 mL/min — ABNORMAL LOW (ref >60)
GFR, EST NON AFRICAN AMERICAN: 39 mL/min — AB (ref >60)
Glucose, Bld: 80 mg/dL (ref 65–99)
POTASSIUM: 3.7 mmol/L (ref 3.5–5.1)
SODIUM: 134 mmol/L — AB (ref 135–145)
Total Bilirubin: 0.4 mg/dL (ref 0.3–1.2)
Total Protein: 6.7 g/dL (ref 6.5–8.1)

## 2017-06-30 LAB — TROPONIN I
TROPONIN I: 0.28 ng/mL — AB (ref <0.03)
TROPONIN I: 0.35 ng/mL — AB (ref <0.03)
Troponin I: 0.4 ng/mL (ref <0.03)

## 2017-06-30 MED ORDER — ZOLEDRONIC ACID 4 MG/5ML IV CONC
4.0000 mg | Freq: Once | INTRAVENOUS | Status: AC
  Administered 2017-06-30: 4 mg via INTRAVENOUS
  Filled 2017-06-30: qty 5

## 2017-06-30 NOTE — Care Management Note (Signed)
Case Management Note  Patient Details  Name: Callia Swim MRN: [id number] Date of Birth: 08-06-1945  Subjective/Objective:           Admitted with hypercalcemia. Pt from home, lives with [name]). Active with [facility] pta. Has had hospice consult but pt not intersted in hospice at this time.          Action/Plan: DC home over weekend with resumption of [facility] services. Juliann Pulse, [facility] rep, aware of admission and will pull resumption orders from chart.    Expected Discharge Date:       07/01/2017           Expected Discharge Plan:  [place]  In-House Referral:  Hospice / Palliative Care  Discharge planning Services  CM Consult  Post Acute Care Choice:  Home Health Choice offered to:  [facility] POA / Guardian, Patient  HH Arranged:  RN [place] Agency:  [place]  Status of Service:  Completed, signed off  If discussed at [place] of Stay Meetings, dates discussed:    Additional Comments:  Sherald Barge, RN 06/30/2017, 2:45 PM

## 2017-06-30 NOTE — Telephone Encounter (Signed)
Patients home health nurse called stating Dr. Walden Field had ordered a UA and UC on patient. The culture came back positive. HHN states the husband took patient to the ER at [facility] and she was admitted.
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
## 2017-06-30 NOTE — Progress Notes (Signed)
Initial Nutrition Assessment  DOCUMENTATION CODES:      INTERVENTION:  Regular diet   Snacks as desired   NUTRITION DIAGNOSIS:   Increased nutrient needs related to wound healing as evidenced by estimated needs.  GOAL:   (Meet nutrition needs as patient desires given the severeity of illness and healthcare goals (outlined by palliative medicine).) MONITOR:   PO intake, Labs, Skin, Weight trends  REASON FOR ASSESSMENT:  Malnutrition Screen      ASSESSMENT:  Patient presents with multiple myeloma and hypercalcemia. She stopped treatment for her myloma in February. She was 2 weeks ago hospitalization due to obstipation.   Diet hx: Patient endorses consuming an "organic" diet that she adopted approximately 4 years ago. Today she ate her oatmeal for breakfast and [name] brings in her favorite foods Candis Schatz - [employer], [place] Protein drinks which she has been consuming between meals.   Patient has weight loss 7% in the past 2 weeks which is significant clinically. She has muscle loss as noted upper body.    BMP Latest Ref Rng & Units 06/30/2017 06/29/2017 06/17/2017  Glucose 65 - 99 mg/dL 80 114(H) 89  BUN 6 - 20 mg/dL 35(H) 38(H) 25(H)  Creatinine 0.44 - 1.00 mg/dL 1.33(H) 1.40(H) 1.24(H)  Sodium 135 - 145 mmol/L 134(L) 127(L) 137  Potassium 3.5 - 5.1 mmol/L 3.7 3.9 3.8  Chloride 101 - 111 mmol/L 99(L) 95(L) 106  CO2 22 - 32 mmol/L 24 23 21(L)  Calcium 8.9 - 10.3 mg/dL 14.4(HH) 14.9(HH) 10.1    Scheduled Meds: . enoxaparin (LOVENOX) injection  40 mg Subcutaneous Q24H   Continuous Infusions: . sodium chloride 135 mL/hr at 06/30/17 1145  . cefTRIAXone (ROCEPHIN)  IV     PRN Meds:.acetaminophen, ondansetron **OR** ondansetron (ZOFRAN) IV   NUTRITION - FOCUSED PHYSICAL EXAM: Moderate temporal, clavicle muscle loss, no edema.   Diet Order:  Diet regular Room service appropriate? Yes; Fluid consistency: Thin  EDUCATION NEEDS:  Education needs have been addressed    Skin:  Skin Assessment: Skin Integrity Issues: Skin Integrity Issues:: Stage II, Stage III Stage II: buttocks Stage III: buttocks  Last BM:  4/5  Height:   Ht Readings from Last 1 Encounters:  06/29/17 _0  (1.6 m)    Weight:   Wt Readings from Last 1 Encounters:  06/29/17 112 lb (50.8 kg)    Ideal Body Weight:  52.2 kg  BMI:  Body mass index is 19.84 kg/m.  Estimated Nutritional Needs:   Kcal:  1500-1700  Protein:  65-70 gr protein  Fluid:  >1500 ml daily   Colman Cater MS,RD,CSG,LDN Office: [phone] Pager: [phone]
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
## 2017-06-30 NOTE — Progress Notes (Signed)
CRITICAL VALUE ALERT  Critical Value: troponin 0.40, calcium 14.1  Date & Time Notied:  06/30/17 0657  Provider Notified: dr.Lama  Orders Received/Actions taken:

## 2017-06-30 NOTE — Care Management Important Message (Signed)
Important Message  Patient Details  Name: Gracelyn Coventry MRN: [id number] Date of Birth: Jul 30, 1945   Medicare Important Message Given:  Yes    Shelda Altes 06/30/2017, 10:16 AM

## 2017-06-30 NOTE — Progress Notes (Signed)
PROGRESS NOTE    Deanna Holloway  MOQ:[id number] DOB: 29-Jun-1945 DOA: 06/29/2017 PCP: Neale Burly, MD     Brief Narrative:  72 year old woman admitted from home on 4/4 due to generalized weakness.  She has a history of multiple myeloma, stage III chronic kidney disease and prior admission for hypercalcemia.  She has an indwelling Foley catheter due to chronic urinary retention.  2 weeks ago she was admitted for hypercalcemia which caused significant fecal impaction and obstipation requiring flexible sigmoidoscopy for disimpaction.  There was concern that she might have a rectovaginal fistula but none was evident at time of endoscopy.  Home health nurse saw her to be weak and confused and advised her to come to the hospital with concerns that she might have a UTI.   Assessment & Plan:   Active Problems:   Hypercalcemia   AKI (acute kidney injury) ([place])   Multiple myeloma not having achieved remission ([facility])   CKD (chronic kidney disease) stage 3, GFR 30-59 ml/min ([facility])   DNR (do not resuscitate)   DNR (do not resuscitate) discussion   Encounter for hospice care discussion   Goals of care, counseling/discussion   Palliative care by specialist   Hypercalcemia of malignancy -Corrected calcium is greater than 16. -Agree with continued IV fluids. -Given a dose of bisphosphonate today. -Patient has a history of multiple myeloma and has refused further treatment for it. -I have consulted palliative care as her hypercalcemia is likely to recur without treatment for her multiple myeloma.  She has agreed to be a DNR, does not want to be further hospitalized and is considering hospice care.  Questionable UTI -She does have an indwelling Foley catheter.  Suspect this is more chronic colonization than an actual UTI. -At this point will elect to discontinue antibiotics and only continue if her urine culture is greater than 100,000 colonies.  Acute metabolic encephalopathy -Due to severe  hypercalcemia -Improving, even though her responses are slow I do believe she retains full capacity at this time.  Acute on chronic kidney disease stage III -Baseline creatinine around 1.4 -Creatinine remains a little better than baseline at 1.3 currently  Multiple myeloma -Patient refuses further treatment. -Hospice talks started.  Elevated troponin -Troponins between 0.28 and 0.40, she denies chest pain. -EKG without acute ischemic changes -Suspect due to demand ischemia -We will request 2D echo. -If abnormalities on echo, will need to determine  further goals of care to determine subsequent cardiac plan.   DVT prophylaxis: Lovenox Code Status: DNR Family Communication: Patient only Disposition Plan: Home likely with hospice services in 24-48 hours  Consultants:   Palliative care  Nephrology  Oncology  Procedures:   2D echo pending  Antimicrobials:  Anti-infectives (From admission, onward)   Start     Dose/Rate Route Frequency Ordered Stop   06/30/17 1800  cefTRIAXone (ROCEPHIN) 1 g in sodium chloride 0.9 % 100 mL IVPB     1 g 200 mL/hr over 30 Minutes Intravenous Every 24 hours 06/29/17 1913     06/29/17 1745  cefTRIAXone (ROCEPHIN) 2 g in sodium chloride 0.9 % 100 mL IVPB     2 g 200 mL/hr over 30 Minutes Intravenous  Once 06/29/17 1742 06/29/17 1909       Subjective: Lying in bed, eating lunch, flat affect  Objective: Vitals:   06/29/17 2242 06/30/17 0701 06/30/17 1040 06/30/17 1408  BP: (!) 92/53 (!) 95/50  (!) 100/53  Pulse: 100 91  93  Resp: 16   18  Temp: 98.2 F (36.8 C)     TempSrc: Oral Oral    SpO2: 95% 97% 97% 100%  Weight:      Height:        Intake/Output Summary (Last 24 hours) at 06/30/2017 1620 Last data filed at 06/30/2017 1509 Gross per 24 hour  Intake 2751.08 ml  Output 1500 ml  Net 1251.08 ml   Filed Weights   06/29/17 1611  Weight: 50.8 kg (112 lb)    Examination:  General exam: Alert, awake, oriented x 3 Respiratory  system: Clear to auscultation. Respiratory effort normal. Cardiovascular system:RRR. No murmurs, rubs, gallops. Gastrointestinal system: Abdomen is nondistended, soft and nontender. No organomegaly or masses felt. Normal bowel sounds heard. Central nervous system: Alert and oriented. No focal neurological deficits. Extremities: No C/C/E, +pedal pulses Skin: No rashes, lesions or ulcers Psychiatry: Judgement and insight appear normal. Mood & affect appropriate.     Data Reviewed: I have personally reviewed following labs and imaging studies  CBC: Recent Labs  Lab 06/29/17 1708 06/30/17 0552  WBC 3.5* 3.1*  NEUTROABS 2.1  --   HGB 8.8* 8.9*  HCT 26.7* 26.9*  MCV 91.8 92.1  PLT 152 654*   Basic Metabolic Panel: Recent Labs  Lab 06/29/17 1708 06/30/17 0552  NA 127* 134*  K 3.9 3.7  CL 95* 99*  CO2 23 24  GLUCOSE 114* 80  BUN 38* 35*  CREATININE 1.40* 1.33*  CALCIUM 14.9* 14.4*   GFR: Estimated Creatinine Clearance: 30.7 mL/min (A) (by C-G formula based on SCr of 1.33 mg/dL (H)). Liver Function Tests: Recent Labs  Lab 06/29/17 1708 06/30/17 0552  AST 36 38  ALT 15 12*  ALKPHOS 72 69  BILITOT 0.4 0.4  PROT 6.8 6.7  ALBUMIN 1.8* 1.8*   Recent Labs  Lab 06/29/17 1708  LIPASE 23   No results for input(s): AMMONIA in the last 168 hours. Coagulation Profile: No results for input(s): INR, PROTIME in the last 168 hours. Cardiac Enzymes: Recent Labs  Lab 06/29/17 1718 06/30/17 0012 06/30/17 0552 06/30/17 1154  TROPONINI 0.23* 0.28* 0.40* 0.35*   BNP (last 3 results) No results for input(s): PROBNP in the last 8760 hours. HbA1C: No results for input(s): HGBA1C in the last 72 hours. CBG: No results for input(s): GLUCAP in the last 168 hours. Lipid Profile: No results for input(s): CHOL, HDL, LDLCALC, TRIG, CHOLHDL, LDLDIRECT in the last 72 hours. Thyroid Function Tests: No results for input(s): TSH, T4TOTAL, FREET4, T3FREE, THYROIDAB in the last 72  hours. Anemia Panel: No results for input(s): VITAMINB12, FOLATE, FERRITIN, TIBC, IRON, RETICCTPCT in the last 72 hours. Urine analysis:    Component Value Date/Time   COLORURINE YELLOW 06/29/2017 1632   APPEARANCEUR TURBID (A) 06/29/2017 1632   LABSPEC 1.008 06/29/2017 1632   PHURINE 7.0 06/29/2017 1632   GLUCOSEU NEGATIVE 06/29/2017 1632   HGBUR MODERATE (A) 06/29/2017 1632   BILIRUBINUR NEGATIVE 06/29/2017 1632   KETONESUR NEGATIVE 06/29/2017 1632   PROTEINUR 30 (A) 06/29/2017 1632   NITRITE NEGATIVE 06/29/2017 1632   LEUKOCYTESUR LARGE (A) 06/29/2017 1632   Sepsis Labs: _0 (procalcitonin:4,lacticidven:4)  ) Recent Results (from the past 240 hour(s))  Blood Culture (routine x 2)     Status: None (Preliminary result)   Collection Time: 06/29/17  5:52 PM  Result Value Ref Range Status   Specimen Description RIGHT ANTECUBITAL DRAWN BY RN  Final   Special Requests   Final    BOTTLES DRAWN AEROBIC AND ANAEROBIC Blood Culture adequate volume  Culture   Final    NO GROWTH < 24 HOURS Performed at [facility], [address] [place], [place] [address]    Report Status PENDING  Incomplete  Blood Culture (routine x 2)     Status: None (Preliminary result)   Collection Time: 06/29/17  5:52 PM  Result Value Ref Range Status   Specimen Description BLOOD LEFT WRIST  Final   Special Requests   Final    BOTTLES DRAWN AEROBIC AND ANAEROBIC Blood Culture adequate volume   Culture   Final    NO GROWTH < 24 HOURS Performed at [facility], [address], [place], [place] [address]    Report Status PENDING  Incomplete         Radiology Studies: Dg Abdomen Acute W/chest  Result Date: 06/29/2017 CLINICAL DATA:  Abdominal pain and weakness. EXAM: DG ABDOMEN ACUTE W/ 1V CHEST COMPARISON:  Abdominal x-ray dated June 13, 2017. FINDINGS: The cardiomediastinal silhouette is normal in size. Normal pulmonary vascularity. No focal consolidation, pleural effusion, or pneumothorax. No  acute osseous abnormality. Healed fracture of the left proximal humerus. Stable gaseous colonic distention with overall unchanged moderate colonic stool burden. Large amount of stool again seen within the rectum. No dilated loops of small bowel. No pneumoperitoneum. IMPRESSION: 1. Unchanged moderate colonic stool burden and large amount of stool within the rectum. Correlate for fecal impaction. 2. No bowel obstruction. 3.  No active cardiopulmonary disease. Electronically Signed   By: Titus Dubin M.D.   On: 06/29/2017 17:50        Scheduled Meds: . enoxaparin (LOVENOX) injection  40 mg Subcutaneous Q24H   Continuous Infusions: . sodium chloride 135 mL/hr at 06/30/17 1145  . cefTRIAXone (ROCEPHIN)  IV       LOS: 1 day    Time spent: 35 minutes. Greater than 50% of this time was spent in direct contact with the patient coordinating care.     Lelon Frohlich, MD [facility] Pager [phone]  If 7PM-7AM, please contact night-coverage [email or web address] Password [email or web address] 06/30/2017, 4:20 PM

## 2017-07-01 LAB — RENAL FUNCTION PANEL
ALBUMIN: 1.7 g/dL — AB (ref 3.5–5.0)
ANION GAP: 9 (ref 5–15)
BUN: 28 mg/dL — AB (ref 6–20)
CO2: 23 mmol/L (ref 22–32)
Calcium: 13.8 mg/dL (ref 8.9–10.3)
Chloride: 103 mmol/L (ref 101–111)
Creatinine, Ser: 1.16 mg/dL — ABNORMAL HIGH (ref 0.44–1.00)
GFR calc Af Amer: 53 mL/min — ABNORMAL LOW (ref >60)
GFR, EST NON AFRICAN AMERICAN: 46 mL/min — AB (ref >60)
Glucose, Bld: 82 mg/dL (ref 65–99)
PHOSPHORUS: 3.1 mg/dL (ref 2.5–4.6)
POTASSIUM: 3.4 mmol/L — AB (ref 3.5–5.1)
Sodium: 135 mmol/L (ref 135–145)

## 2017-07-01 LAB — COMPREHENSIVE METABOLIC PANEL
ALT: 12 U/L — AB (ref 14–54)
AST: 36 U/L (ref 15–41)
Albumin: 1.7 g/dL — ABNORMAL LOW (ref 3.5–5.0)
Alkaline Phosphatase: 65 U/L (ref 38–126)
Anion gap: 9 (ref 5–15)
BUN: 28 mg/dL — ABNORMAL HIGH (ref 6–20)
CHLORIDE: 103 mmol/L (ref 101–111)
CO2: 22 mmol/L (ref 22–32)
CREATININE: 1.16 mg/dL — AB (ref 0.44–1.00)
Calcium: 13.8 mg/dL (ref 8.9–10.3)
GFR calc non Af Amer: 46 mL/min — ABNORMAL LOW (ref >60)
GFR, EST AFRICAN AMERICAN: 53 mL/min — AB (ref >60)
Glucose, Bld: 80 mg/dL (ref 65–99)
POTASSIUM: 3.4 mmol/L — AB (ref 3.5–5.1)
SODIUM: 134 mmol/L — AB (ref 135–145)
Total Bilirubin: 0.7 mg/dL (ref 0.3–1.2)
Total Protein: 6.2 g/dL — ABNORMAL LOW (ref 6.5–8.1)

## 2017-07-01 LAB — CBC
HEMATOCRIT: 25.3 % — AB (ref 36.0–46.0)
HEMOGLOBIN: 8.3 g/dL — AB (ref 12.0–15.0)
MCH: 30.5 pg (ref 26.0–34.0)
MCHC: 32.8 g/dL (ref 30.0–36.0)
MCV: 93 fL (ref 78.0–100.0)
Platelets: 149 10*3/uL — ABNORMAL LOW (ref 150–400)
RBC: 2.72 MIL/uL — ABNORMAL LOW (ref 3.87–5.11)
RDW: 15.3 % (ref 11.5–15.5)
WBC: 3.6 10*3/uL — ABNORMAL LOW (ref 4.0–10.5)

## 2017-07-01 MED ORDER — SODIUM CHLORIDE 0.9 % IV SOLN
1.0000 g | INTRAVENOUS | Status: DC
  Administered 2017-07-01 – 2017-07-02 (×2): 1 g via INTRAVENOUS
  Filled 2017-07-01: qty 10
  Filled 2017-07-01: qty 1
  Filled 2017-07-01: qty 10

## 2017-07-01 MED ORDER — POTASSIUM CHLORIDE CRYS ER 20 MEQ PO TBCR
40.0000 meq | EXTENDED_RELEASE_TABLET | Freq: Every day | ORAL | Status: DC
  Administered 2017-07-01: 40 meq via ORAL
  Filled 2017-07-01 (×2): qty 2

## 2017-07-01 NOTE — Progress Notes (Signed)
Subjective: Interval History: has no complaint of nausea or vomiting.  Patient also denies any difficulty breathing..  Objective: Vital signs in last 24 hours: Temp:  [98.2 F (36.8 C)-98.5 F (36.9 C)] 98.2 F (36.8 C) (04/06 0458) Pulse Rate:  [93-97] 97 (04/06 0458) Resp:  [18] 18 (04/05 1408) BP: (100-103)/(53-57) 101/56 (04/06 0458) SpO2:  [95 %-100 %] 97 % (04/06 0458) Weight change:   Intake/Output from previous day: 04/05 0701 - 04/06 0700 In: 1832 [P.O.:200; I.V.:1632] Out: 3351 [Urine:3350; Stool:1] Intake/Output this shift: No intake/output data recorded.  General appearance: alert, cooperative and no distress Resp: clear to auscultation bilaterally Cardio: regular rate and rhythm, S1, S2 normal, no murmur, click, rub or gallop Extremities: No edema  Lab Results: Recent Labs    06/30/17 0552 07/01/17 0549  WBC 3.1* 3.6*  HGB 8.9* 8.3*  HCT 26.9* 25.3*  PLT 149* 149*   BMET:  Recent Labs    06/30/17 0552 07/01/17 0549  NA 134* 134*  135  K 3.7 3.4*  3.4*  CL 99* 103  103  CO2 _0 GLUCOSE 80 80  82  BUN 35* 28*  28*  CREATININE 1.33* 1.16*  1.16*  CALCIUM 14.4* 13.8*  13.8*   No results for input(s): PTH in the last 72 hours. Iron Studies: No results for input(s): IRON, TIBC, TRANSFERRIN, FERRITIN in the last 72 hours.  Studies/Results: Dg Abdomen Acute W/chest  Result Date: 06/29/2017 CLINICAL DATA:  Abdominal pain and weakness. EXAM: DG ABDOMEN ACUTE W/ 1V CHEST COMPARISON:  Abdominal x-ray dated June 13, 2017. FINDINGS: The cardiomediastinal silhouette is normal in size. Normal pulmonary vascularity. No focal consolidation, pleural effusion, or pneumothorax. No acute osseous abnormality. Healed fracture of the left proximal humerus. Stable gaseous colonic distention with overall unchanged moderate colonic stool burden. Large amount of stool again seen within the rectum. No dilated loops of small bowel. No pneumoperitoneum.  IMPRESSION: 1. Unchanged moderate colonic stool burden and large amount of stool within the rectum. Correlate for fecal impaction. 2. No bowel obstruction. 3.  No active cardiopulmonary disease. Electronically Signed   By: Titus Dubin M.D.   On: 06/29/2017 17:50    I have reviewed the patient's current medications.  Assessment/Plan: 1] hypercalcemia: Presently her her corrected calcium for albumin calcium is 15.8  .  Her calcium is improving.  Thought to be secondary to multiple myeloma. 2] renal failure: Her creatinine is 1.16 her renal function seems to be improving.  Patient is presently nonoliguric with 3300 cc of urine output the last 24 hours. 3 hypokalemia: Her potassium is 3.4 slightly low 4] bone and mineral disorder: Her calcium and phosphorus is a range 5] anemia her hemoglobin is 8.3 and stable 6] multiple myeloma in remission Plan: 1]We will continue with  present management 2] we will start her on KCl 40 mEq p.o. once a day 3] we will check a renal panel in the morning.    LOS: 2 days   Bruce Churilla S 07/01/2017,9:20 AM

## 2017-07-01 NOTE — Progress Notes (Addendum)
PROGRESS NOTE    Deanna Holloway  TMA:[id number] DOB: Nov 04, 1945 DOA: 06/29/2017 PCP: Neale Burly, MD     Brief Narrative:  72 year old woman admitted from home on 4/4 due to generalized weakness.  She has a history of multiple myeloma, stage III chronic kidney disease and prior admission for hypercalcemia.  She has an indwelling Foley catheter due to chronic urinary retention.  2 weeks ago she was admitted for hypercalcemia which caused significant fecal impaction and obstipation requiring flexible sigmoidoscopy for disimpaction.  There was concern that she might have a rectovaginal fistula but none was evident at time of endoscopy.  Home health nurse saw her to be weak and confused and advised her to come to the hospital with concerns that she might have a UTI.   Assessment & Plan:   Active Problems:   Hypercalcemia   AKI (acute kidney injury) ([place])   Multiple myeloma not having achieved remission (HCC)   CKD (chronic kidney disease) stage 3, GFR 30-59 ml/min (HCC)   DNR (do not resuscitate)   DNR (do not resuscitate) discussion   Encounter for hospice care discussion   Goals of care, counseling/discussion   Palliative care by specialist   Hypercalcemia of malignancy -Corrected calcium today is 15.6 -Agree with continued IV fluids. -Given a dose of bisphosphonate on 4/5. -Patient has a history of multiple myeloma and has refused further treatment for it. -I have consulted palliative care as her hypercalcemia is likely to recur without treatment for her multiple myeloma.  She has agreed to be a DNR, does not want to be further hospitalized and is considering hospice care.   UTI -She does have an indwelling Foley catheter.   -Initially thought to be a contaminant given indwelling Foley catheter and antibiotics were discontinued.  However on 4/6 cultures are growing greater than 100,000 colonies of gram-negative rods and will reinstitute treatment with Rocephin.  Acute  metabolic encephalopathy -Due to severe hypercalcemia -Completely resolved.   -She retains full capacity at this time.  Acute on chronic kidney disease stage III -Baseline creatinine around 1.4 -Creatinine remains better than baseline at 1.16 currently  Multiple myeloma -Patient refuses further treatment. -Hospice talks started.  Elevated troponin -Troponins between 0.28 and 0.40, she denies chest pain. -EKG without acute ischemic changes -Suspect due to demand ischemia -Will request 2D echo. -If abnormalities on echo, will need to determine  further goals of care to determine subsequent cardiac plan.   DVT prophylaxis: Lovenox Code Status: DNR Family Communication: Patient only Disposition Plan: Home likely with hospice services in 24-48 hours if patient agrees to hospice services. Did have a prolonged discussion with her today, totaling 32 minutes, regarding GOC and further plan of care given her MM (for which she refuses treatment) with recalcitrant hypercalcemia. She would like discuss with HCPOA before making a final decision.  Consultants:   Palliative care  Nephrology  Oncology  Procedures:   2D echo pending  Antimicrobials:  Anti-infectives (From admission, onward)   Start     Dose/Rate Route Frequency Ordered Stop   06/30/17 1800  cefTRIAXone (ROCEPHIN) 1 g in sodium chloride 0.9 % 100 mL IVPB  Status:  Discontinued     1 g 200 mL/hr over 30 Minutes Intravenous Every 24 hours 06/29/17 1913 06/30/17 1634   06/29/17 1745  cefTRIAXone (ROCEPHIN) 2 g in sodium chloride 0.9 % 100 mL IVPB     2 g 200 mL/hr over 30 Minutes Intravenous  Once 06/29/17 1742 06/29/17 1909  Subjective: In bed, feels generally well, states she does not feel confused and is able to have conversation with me today, denies chest pain, shortness of breath, does complain of severe fatigue.  Objective: Vitals:   06/30/17 1040 06/30/17 1408 06/30/17 2031 07/01/17 0458  BP:  (!)  100/53 (!) 103/57 (!) 101/56  Pulse:  93 96 97  Resp:  18    Temp:   98.5 F (36.9 C) 98.2 F (36.8 C)  TempSrc:   Oral Oral  SpO2: 97% 100% 95% 97%  Weight:      Height:        Intake/Output Summary (Last 24 hours) at 07/01/2017 1048 Last data filed at 07/01/2017 0503 Gross per 24 hour  Intake 1529.92 ml  Output 3351 ml  Net -1821.08 ml   Filed Weights   06/29/17 1611  Weight: 50.8 kg (112 lb)    Examination:  General exam: Alert, awake, oriented x 3 Respiratory system: Clear to auscultation. Respiratory effort normal. Cardiovascular system:RRR. No murmurs, rubs, gallops. Gastrointestinal system: Abdomen is nondistended, soft and nontender. No organomegaly or masses felt. Normal bowel sounds heard. Central nervous system: Alert and oriented. No focal neurological deficits. Extremities: No C/C/E, +pedal pulses Skin: No rashes, lesions or ulcers Psychiatry: Judgement and insight appear normal. Mood & affect appropriate.      Data Reviewed: I have personally reviewed following labs and imaging studies  CBC: Recent Labs  Lab 06/29/17 1708 06/30/17 0552 07/01/17 0549  WBC 3.5* 3.1* 3.6*  NEUTROABS 2.1  --   --   HGB 8.8* 8.9* 8.3*  HCT 26.7* 26.9* 25.3*  MCV 91.8 92.1 93.0  PLT 152 149* 923*   Basic Metabolic Panel: Recent Labs  Lab 06/29/17 1708 06/30/17 0552 07/01/17 0549  NA 127* 134* 134*  135  K 3.9 3.7 3.4*  3.4*  CL 95* 99* 103  103  CO2 _0 GLUCOSE 114* 80 80  82  BUN 38* 35* 28*  28*  CREATININE 1.40* 1.33* 1.16*  1.16*  CALCIUM 14.9* 14.4* 13.8*  13.8*  PHOS  --   --  3.1   GFR: Estimated Creatinine Clearance: 35.2 mL/min (A) (by C-G formula based on SCr of 1.16 mg/dL (H)). Liver Function Tests: Recent Labs  Lab 06/29/17 1708 06/30/17 0552 07/01/17 0549  AST 36 38 36  ALT 15 12* 12*  ALKPHOS 72 69 65  BILITOT 0.4 0.4 0.7  PROT 6.8 6.7 6.2*  ALBUMIN 1.8* 1.8* 1.7*  1.7*   Recent Labs  Lab 06/29/17 1708  LIPASE  23   No results for input(s): AMMONIA in the last 168 hours. Coagulation Profile: No results for input(s): INR, PROTIME in the last 168 hours. Cardiac Enzymes: Recent Labs  Lab 06/29/17 1718 06/30/17 0012 06/30/17 0552 06/30/17 1154  TROPONINI 0.23* 0.28* 0.40* 0.35*   BNP (last 3 results) No results for input(s): PROBNP in the last 8760 hours. HbA1C: No results for input(s): HGBA1C in the last 72 hours. CBG: No results for input(s): GLUCAP in the last 168 hours. Lipid Profile: No results for input(s): CHOL, HDL, LDLCALC, TRIG, CHOLHDL, LDLDIRECT in the last 72 hours. Thyroid Function Tests: No results for input(s): TSH, T4TOTAL, FREET4, T3FREE, THYROIDAB in the last 72 hours. Anemia Panel: No results for input(s): VITAMINB12, FOLATE, FERRITIN, TIBC, IRON, RETICCTPCT in the last 72 hours. Urine analysis:    Component Value Date/Time   COLORURINE YELLOW 06/29/2017 1632   APPEARANCEUR TURBID (A) 06/29/2017 1632   LABSPEC  1.008 06/29/2017 1632   PHURINE 7.0 06/29/2017 1632   GLUCOSEU NEGATIVE 06/29/2017 1632   HGBUR MODERATE (A) 06/29/2017 1632   BILIRUBINUR NEGATIVE 06/29/2017 [place] 06/29/2017 1632   PROTEINUR 30 (A) 06/29/2017 1632   NITRITE NEGATIVE 06/29/2017 1632   LEUKOCYTESUR LARGE (A) 06/29/2017 1632   Sepsis Labs: _0 (procalcitonin:4,lacticidven:4)  ) Recent Results (from the past 240 hour(s))  Urine culture     Status: Abnormal (Preliminary result)   Collection Time: 06/29/17  4:32 PM  Result Value Ref Range Status   Specimen Description   Final    URINE, CATHETERIZED Performed at [facility], [address] [place], [place] [address]    Special Requests   Final    Normal Performed at [facility], [address], [place], [place] [address]    Culture (A)  Final    >=100,000 COLONIES/mL GRAM NEGATIVE RODS IDENTIFICATION AND SUSCEPTIBILITIES TO FOLLOW Performed at [facility], [place] [address], [place], [place]  [address]    Report Status PENDING  Incomplete  Blood Culture (routine x 2)     Status: None (Preliminary result)   Collection Time: 06/29/17  5:52 PM  Result Value Ref Range Status   Specimen Description RIGHT ANTECUBITAL DRAWN BY RN  Final   Special Requests   Final    BOTTLES DRAWN AEROBIC AND ANAEROBIC Blood Culture adequate volume   Culture   Final    NO GROWTH < 24 HOURS Performed at [facility], [address], [place], [place] [address]    Report Status PENDING  Incomplete  Blood Culture (routine x 2)     Status: None (Preliminary result)   Collection Time: 06/29/17  5:52 PM  Result Value Ref Range Status   Specimen Description BLOOD LEFT WRIST  Final   Special Requests   Final    BOTTLES DRAWN AEROBIC AND ANAEROBIC Blood Culture adequate volume   Culture   Final    NO GROWTH < 24 HOURS Performed at [facility], [address] [place], [place] [address]    Report Status PENDING  Incomplete         Radiology Studies: Dg Abdomen Acute W/chest  Result Date: 06/29/2017 CLINICAL DATA:  Abdominal pain and weakness. EXAM: DG ABDOMEN ACUTE W/ 1V CHEST COMPARISON:  Abdominal x-ray dated June 13, 2017. FINDINGS: The cardiomediastinal silhouette is normal in size. Normal pulmonary vascularity. No focal consolidation, pleural effusion, or pneumothorax. No acute osseous abnormality. Healed fracture of the left proximal humerus. Stable gaseous colonic distention with overall unchanged moderate colonic stool burden. Large amount of stool again seen within the rectum. No dilated loops of small bowel. No pneumoperitoneum. IMPRESSION: 1. Unchanged moderate colonic stool burden and large amount of stool within the rectum. Correlate for fecal impaction. 2. No bowel obstruction. 3.  No active cardiopulmonary disease. Electronically Signed   By: Titus Dubin M.D.   On: 06/29/2017 17:50        Scheduled Meds: . enoxaparin (LOVENOX) injection  40 mg Subcutaneous Q24H  . potassium chloride   40 mEq Oral Daily   Continuous Infusions: . sodium chloride 135 mL/hr at 07/01/17 0415     LOS: 2 days    Time spent: 35 minutes. Greater than 50% of this time was spent in direct contact with the patient coordinating care.     Lelon Frohlich, MD [facility] Pager [phone]  If 7PM-7AM, please contact night-coverage [email or web address] Password [facility] 07/01/2017, 10:48 AM

## 2017-07-01 NOTE — Progress Notes (Signed)
CRITICAL VALUE ALERT  Critical Value:  Calcium 13.8  Date & Time Notied:  07/01/2017 0720  Provider Notified: Dr. Jerilee Hoh 0730  Orders Received/Actions taken: [facility]

## 2017-07-02 ENCOUNTER — Inpatient Hospital Stay (HOSPITAL_COMMUNITY): Payer: Medicare Other

## 2017-07-02 DIAGNOSIS — I5030 Unspecified diastolic (congestive) heart failure: Secondary | ICD-10-CM

## 2017-07-02 LAB — URINE CULTURE: SPECIAL REQUESTS: NORMAL

## 2017-07-02 LAB — ECHOCARDIOGRAM COMPLETE
HEIGHTINCHES: 63 in
WEIGHTICAEL: 1792 [oz_av]

## 2017-07-02 LAB — RENAL FUNCTION PANEL
Albumin: 1.7 g/dL — ABNORMAL LOW (ref 3.5–5.0)
Anion gap: 10 (ref 5–15)
BUN: 22 mg/dL — AB (ref 6–20)
CHLORIDE: 106 mmol/L (ref 101–111)
CO2: 20 mmol/L — AB (ref 22–32)
Calcium: 12.8 mg/dL — ABNORMAL HIGH (ref 8.9–10.3)
Creatinine, Ser: 1.05 mg/dL — ABNORMAL HIGH (ref 0.44–1.00)
GFR calc Af Amer: 60 mL/min — ABNORMAL LOW (ref >60)
GFR, EST NON AFRICAN AMERICAN: 52 mL/min — AB (ref >60)
GLUCOSE: 79 mg/dL (ref 65–99)
POTASSIUM: 3.6 mmol/L (ref 3.5–5.1)
Phosphorus: 3 mg/dL (ref 2.5–4.6)
Sodium: 136 mmol/L (ref 135–145)

## 2017-07-02 MED ORDER — CALCITONIN (SALMON) 200 UNIT/ACT NA SOLN
1.0000 | Freq: Every day | NASAL | Status: DC
  Administered 2017-07-02 – 2017-07-05 (×4): 1 via NASAL
  Filled 2017-07-02: qty 3.7

## 2017-07-02 MED ORDER — CIPROFLOXACIN HCL 250 MG PO TABS
500.0000 mg | ORAL_TABLET | Freq: Two times a day (BID) | ORAL | Status: DC
  Administered 2017-07-02 – 2017-07-05 (×7): 500 mg via ORAL
  Filled 2017-07-02 (×7): qty 2

## 2017-07-02 NOTE — Progress Notes (Signed)
  Echocardiogram 2D Echocardiogram has been performed.  Deanna Holloway L Androw 07/02/2017, 8:15 AM

## 2017-07-02 NOTE — Progress Notes (Signed)
PROGRESS NOTE    Deanna Holloway  HEN:[id number] DOB: 10/05/1945 DOA: 06/29/2017 PCP: Neale Burly, MD     Brief Narrative:  72 year old woman admitted from home on 4/4 due to generalized weakness.  She has a history of multiple myeloma, stage III chronic kidney disease and prior admission for hypercalcemia.  She has an indwelling Foley catheter due to chronic urinary retention.  2 weeks ago she was admitted for hypercalcemia which caused significant fecal impaction and obstipation requiring flexible sigmoidoscopy for disimpaction.  There was concern that she might have a rectovaginal fistula but none was evident at time of endoscopy.  Home health nurse saw her to be weak and confused and advised her to come to the hospital with concerns that she might have a UTI.   Assessment & Plan:   Active Problems:   Hypercalcemia   AKI (acute kidney injury) ([place])   Multiple myeloma not having achieved remission (HCC)   CKD (chronic kidney disease) stage 3, GFR 30-59 ml/min (HCC)   DNR (do not resuscitate)   DNR (do not resuscitate) discussion   Encounter for hospice care discussion   Goals of care, counseling/discussion   Palliative care by specialist   Hypercalcemia of malignancy -Corrected calcium today is 14.6 (continues to decrease). -Agree with continued IV fluids. -Given a dose of bisphosphonate on 4/5. -Resume calcitonin. -Patient has a history of multiple myeloma and has refused further treatment for it. -I have consulted palliative care as her hypercalcemia is likely to recur without treatment for her multiple myeloma.  She has agreed to be a DNR. -Have had prolonged discussions with her and her healthcare power of attorney, [name], regarding her short and long-term goals of care.  She is still debating whether she would want to pursue hospice or whether she would prefer to be hospitalized with recurrent episodes of hypercalcemia.  I suspect that for now she will elect recurrent  hospitalizations as she does not seem ready to pull the trigger for hospice yet.   UTI -She does have an indwelling Foley catheter.   -Initially thought to be a contaminant given indwelling Foley catheter and antibiotics were discontinued.  However on 4/6 cultures are growing greater than 100,000 colonies of gram-negative rods and will reinstitute treatment with Rocephin. -On 4/7 cultures have grown Pseudomonas that is pansensitive, will transition over to Cipro 500 mg twice daily and aim for 7 days of treatment.  Acute metabolic encephalopathy -Due to severe hypercalcemia -Completely resolved.   -She retains full capacity at this time.  Acute on chronic kidney disease stage III -Baseline creatinine around 1.4 -Creatinine remains better than baseline at 1.05 currently  Multiple myeloma -Patient refuses further treatment. -Hospice talks started.  Elevated troponin -Troponins between 0.28 and 0.40, she denies chest pain. -EKG without acute ischemic changes -Suspect due to demand ischemia -Will request 2D echo. 4/6: ECHO completed but results pending. -If abnormalities on echo, will need to determine  further goals of care to determine subsequent cardiac plan.   DVT prophylaxis: Lovenox Code Status: DNR Family Communication: Patient only Disposition Plan: Home once hypercalcemia resolved, anticipate 48 hours.  I did have again another prolonged discussion with patient and her healthcare power of attorney for approximately 38 minutes.  They both seem to have a better understanding as to the natural progression of her multiple myeloma if left untreated, mainly the recurrent nature of the hypercalcemia and need for recurrent hospitalizations due to this.  I have strongly encouraged her to consider hospice, however  for her hospice has negative connotations due to past experiences and I am not sure that she is ready to pull the trigger yet.  Consultants:   Palliative  care  Nephrology  Oncology  Procedures:   2D echo pending  Antimicrobials:  Anti-infectives (From admission, onward)   Start     Dose/Rate Route Frequency Ordered Stop   07/01/17 1130  cefTRIAXone (ROCEPHIN) 1 g in sodium chloride 0.9 % 100 mL IVPB     1 g 200 mL/hr over 30 Minutes Intravenous Every 24 hours 07/01/17 1050     06/30/17 1800  cefTRIAXone (ROCEPHIN) 1 g in sodium chloride 0.9 % 100 mL IVPB  Status:  Discontinued     1 g 200 mL/hr over 30 Minutes Intravenous Every 24 hours 06/29/17 1913 06/30/17 1634   06/29/17 1745  cefTRIAXone (ROCEPHIN) 2 g in sodium chloride 0.9 % 100 mL IVPB     2 g 200 mL/hr over 30 Minutes Intravenous  Once 06/29/17 1742 06/29/17 1909       Subjective: Lying in bed, feels well, no complaints other than continued weakness, she is considering a voiding trial as she feels like her indwelling catheter limits her mobility, however she wants some time to ponder that decision as she remembers how painful the insertion was.  Objective: Vitals:   07/01/17 0458 07/01/17 1535 07/01/17 2122 07/02/17 0611  BP: (!) 101/56 110/67 108/63 (!) 106/59  Pulse: 97 (!) 104 (!) 102 95  Resp:  '18 18 18  ' Temp: 98.2 F (36.8 C) 98.3 F (36.8 C) 98.6 F (37 C) 98.4 F (36.9 C)  TempSrc: Oral Oral Oral Oral  SpO2: 97% 99% 97% 98%  Weight:      Height:        Intake/Output Summary (Last 24 hours) at 07/02/2017 1226 Last data filed at 07/02/2017 0612 Gross per 24 hour  Intake 3000.25 ml  Output 2900 ml  Net 100.25 ml   Filed Weights   06/29/17 1611  Weight: 50.8 kg (112 lb)    Examination:  General exam: Alert, awake, oriented x 3 Respiratory system: Clear to auscultation. Respiratory effort normal. Cardiovascular system:RRR. No murmurs, rubs, gallops. Gastrointestinal system: Abdomen is nondistended, soft and nontender. No organomegaly or masses felt. Normal bowel sounds heard. Central nervous system: Alert and oriented. No focal neurological  deficits. Extremities: No C/C/E, +pedal pulses Skin: No rashes, lesions or ulcers Psychiatry: Judgement and insight appear normal. Mood & affect appropriate.        Data Reviewed: I have personally reviewed following labs and imaging studies  CBC: Recent Labs  Lab 06/29/17 1708 06/30/17 0552 07/01/17 0549  WBC 3.5* 3.1* 3.6*  NEUTROABS 2.1  --   --   HGB 8.8* 8.9* 8.3*  HCT 26.7* 26.9* 25.3*  MCV 91.8 92.1 93.0  PLT 152 149* 280*   Basic Metabolic Panel: Recent Labs  Lab 06/29/17 1708 06/30/17 0552 07/01/17 0549 07/02/17 0619  NA 127* 134* 134*  135 136  K 3.9 3.7 3.4*  3.4* 3.6  CL 95* 99* 103  103 106  CO2 '23 24 22  23 ' 20*  GLUCOSE 114* 80 80  82 79  BUN 38* 35* 28*  28* 22*  CREATININE 1.40* 1.33* 1.16*  1.16* 1.05*  CALCIUM 14.9* 14.4* 13.8*  13.8* 12.8*  PHOS  --   --  3.1 3.0   GFR: Estimated Creatinine Clearance: 38.8 mL/min (A) (by C-G formula based on SCr of 1.05 mg/dL (H)). Liver Function Tests: Recent  Labs  Lab 06/29/17 1708 06/30/17 0552 07/01/17 0549 07/02/17 0619  AST 36 38 36  --   ALT 15 12* 12*  --   ALKPHOS 72 69 65  --   BILITOT 0.4 0.4 0.7  --   PROT 6.8 6.7 6.2*  --   ALBUMIN 1.8* 1.8* 1.7*  1.7* 1.7*   Recent Labs  Lab 06/29/17 1708  LIPASE 23   No results for input(s): AMMONIA in the last 168 hours. Coagulation Profile: No results for input(s): INR, PROTIME in the last 168 hours. Cardiac Enzymes: Recent Labs  Lab 06/29/17 1718 06/30/17 0012 06/30/17 0552 06/30/17 1154  TROPONINI 0.23* 0.28* 0.40* 0.35*   BNP (last 3 results) No results for input(s): PROBNP in the last 8760 hours. HbA1C: No results for input(s): HGBA1C in the last 72 hours. CBG: No results for input(s): GLUCAP in the last 168 hours. Lipid Profile: No results for input(s): CHOL, HDL, LDLCALC, TRIG, CHOLHDL, LDLDIRECT in the last 72 hours. Thyroid Function Tests: No results for input(s): TSH, T4TOTAL, FREET4, T3FREE, THYROIDAB in the last  72 hours. Anemia Panel: No results for input(s): VITAMINB12, FOLATE, FERRITIN, TIBC, IRON, RETICCTPCT in the last 72 hours. Urine analysis:    Component Value Date/Time   COLORURINE YELLOW 06/29/2017 1632   APPEARANCEUR TURBID (A) 06/29/2017 1632   LABSPEC 1.008 06/29/2017 1632   PHURINE 7.0 06/29/2017 1632   GLUCOSEU NEGATIVE 06/29/2017 1632   HGBUR MODERATE (A) 06/29/2017 1632   BILIRUBINUR NEGATIVE 06/29/2017 1632   KETONESUR NEGATIVE 06/29/2017 1632   PROTEINUR 30 (A) 06/29/2017 1632   NITRITE NEGATIVE 06/29/2017 1632   LEUKOCYTESUR LARGE (A) 06/29/2017 1632   Sepsis Labs: '@LABRCNTIP' (procalcitonin:4,lacticidven:4)  ) Recent Results (from the past 240 hour(s))  Urine culture     Status: Abnormal   Collection Time: 06/29/17  4:32 PM  Result Value Ref Range Status   Specimen Description   Final    URINE, CATHETERIZED Performed at [facility], [address], [place], [place] [address]    Special Requests   Final    Normal Performed at [facility], [address], [place], [place] [address]    Culture >=100,000 COLONIES/mL PSEUDOMONAS AERUGINOSA (A)  Final   Report Status 07/02/2017 FINAL  Final   Organism ID, Bacteria PSEUDOMONAS AERUGINOSA (A)  Final      Susceptibility   Pseudomonas aeruginosa - MIC*    CEFTAZIDIME <=1 SENSITIVE Sensitive     CIPROFLOXACIN <=0.25 SENSITIVE Sensitive     GENTAMICIN <=1 SENSITIVE Sensitive     IMIPENEM 1 SENSITIVE Sensitive     PIP/TAZO <=4 SENSITIVE Sensitive     CEFEPIME <=1 SENSITIVE Sensitive     * >=100,000 COLONIES/mL PSEUDOMONAS AERUGINOSA  Blood Culture (routine x 2)     Status: None (Preliminary result)   Collection Time: 06/29/17  5:52 PM  Result Value Ref Range Status   Specimen Description RIGHT ANTECUBITAL DRAWN BY RN  Final   Special Requests   Final    BOTTLES DRAWN AEROBIC AND ANAEROBIC Blood Culture adequate volume   Culture   Final    NO GROWTH 3 DAYS Performed at [facility], [address], [place],  [place] [address]    Report Status PENDING  Incomplete  Blood Culture (routine x 2)     Status: None (Preliminary result)   Collection Time: 06/29/17  5:52 PM  Result Value Ref Range Status   Specimen Description BLOOD LEFT WRIST  Final   Special Requests   Final  BOTTLES DRAWN AEROBIC AND ANAEROBIC Blood Culture adequate volume   Culture   Final    NO GROWTH 3 DAYS Performed at [facility], [address], [place], [place] [address]    Report Status PENDING  Incomplete         Radiology Studies: No results found.      Scheduled Meds: . enoxaparin (LOVENOX) injection  40 mg Subcutaneous Q24H   Continuous Infusions: . sodium chloride 135 mL/hr at 07/02/17 0946  . cefTRIAXone (ROCEPHIN)  IV 1 g (07/02/17 1217)     LOS: 3 days    Time spent: 40 minutes. Greater than 50% of this time was spent in direct contact with the patient coordinating care.     Lelon Frohlich, MD [facility] Pager [phone]  If 7PM-7AM, please contact night-coverage [email or web address] Password [facility] 07/02/2017, 12:26 PM

## 2017-07-02 NOTE — Progress Notes (Signed)
Subjective: Interval History: Patient offers no complaints.  Denies any difficulty breathing.  Objective: Vital signs in last 24 hours: Temp:  [98.3 F (36.8 C)-98.6 F (37 C)] 98.4 F (36.9 C) (04/07 0611) Pulse Rate:  [95-104] 95 (04/07 0611) Resp:  [18] 18 (04/07 0611) BP: (106-110)/(59-67) 106/59 (04/07 0611) SpO2:  [97 %-99 %] 98 % (04/07 0611) Weight change:   Intake/Output from previous day: 04/06 0701 - 04/07 0700 In: 3000.3 [I.V.:2900.3; IV Piggyback:100] Out: 2900 [Urine:2900] Intake/Output this shift: No intake/output data recorded.  General appearance: alert, cooperative and no distress Resp: clear to auscultation bilaterally Cardio: regular rate and rhythm, S1, S2 normal, no murmur, click, rub or gallop Extremities: No edema  Lab Results: Recent Labs    06/30/17 0552 07/01/17 0549  WBC 3.1* 3.6*  HGB 8.9* 8.3*  HCT 26.9* 25.3*  PLT 149* 149*   BMET:  Recent Labs    07/01/17 0549 07/02/17 0619  NA 134*  135 136  K 3.4*  3.4* 3.6  CL 103  103 106  CO2 22  23 20*  GLUCOSE 80  82 79  BUN 28*  28* 22*  CREATININE 1.16*  1.16* 1.05*  CALCIUM 13.8*  13.8* 12.8*   No results for input(s): PTH in the last 72 hours. Iron Studies: No results for input(s): IRON, TIBC, TRANSFERRIN, FERRITIN in the last 72 hours.  Studies/Results: No results found.  I have reviewed the patient's current medications.  Assessment/Plan: 1] hypercalcemia: Her calcium is progressively improving.  Patient is nonoliguric.  Her calcium has come down to 12.8. 2] renal failure: Her creatinine is 1.05.  Her renal function has returned to her baseline.  Patient has 2900 cc of urine output. 3: Hypokalemia: Her potassium is normal today 4] bone and mineral disorder: Her calcium and phosphorus is a range 5] anemia her hemoglobin is 8.3 and stable 6] multiple myeloma in remission 7] hyponatremia: Hypovolemic hyponatremia.  Sodium is normal. Plan: 1]We will continue with   present management 2] we will DC potassium 3] we will check a renal panel in the morning.    LOS: 3 days   Granvil Djordjevic S 07/02/2017,8:41 AM

## 2017-07-03 ENCOUNTER — Encounter (HOSPITAL_COMMUNITY): Payer: Self-pay | Admitting: Internal Medicine

## 2017-07-03 DIAGNOSIS — I248 Other forms of acute ischemic heart disease: Secondary | ICD-10-CM

## 2017-07-03 DIAGNOSIS — I5189 Other ill-defined heart diseases: Secondary | ICD-10-CM

## 2017-07-03 DIAGNOSIS — N183 Chronic kidney disease, stage 3 (moderate): Secondary | ICD-10-CM

## 2017-07-03 DIAGNOSIS — L8992 Pressure ulcer of unspecified site, stage 2: Secondary | ICD-10-CM

## 2017-07-03 DIAGNOSIS — N390 Urinary tract infection, site not specified: Secondary | ICD-10-CM

## 2017-07-03 DIAGNOSIS — R748 Abnormal levels of other serum enzymes: Secondary | ICD-10-CM

## 2017-07-03 DIAGNOSIS — E871 Hypo-osmolality and hyponatremia: Secondary | ICD-10-CM

## 2017-07-03 DIAGNOSIS — G9341 Metabolic encephalopathy: Secondary | ICD-10-CM | POA: Diagnosis present

## 2017-07-03 DIAGNOSIS — B965 Pseudomonas (aeruginosa) (mallei) (pseudomallei) as the cause of diseases classified elsewhere: Secondary | ICD-10-CM | POA: Diagnosis present

## 2017-07-03 DIAGNOSIS — I519 Heart disease, unspecified: Secondary | ICD-10-CM | POA: Diagnosis present

## 2017-07-03 DIAGNOSIS — R7989 Other specified abnormal findings of blood chemistry: Secondary | ICD-10-CM

## 2017-07-03 DIAGNOSIS — E876 Hypokalemia: Secondary | ICD-10-CM

## 2017-07-03 DIAGNOSIS — E43 Unspecified severe protein-calorie malnutrition: Secondary | ICD-10-CM

## 2017-07-03 DIAGNOSIS — D61818 Other pancytopenia: Secondary | ICD-10-CM | POA: Diagnosis present

## 2017-07-03 DIAGNOSIS — R636 Underweight: Secondary | ICD-10-CM | POA: Diagnosis present

## 2017-07-03 DIAGNOSIS — R778 Other specified abnormalities of plasma proteins: Secondary | ICD-10-CM

## 2017-07-03 HISTORY — DX: Other ill-defined heart diseases: I51.89

## 2017-07-03 LAB — RENAL FUNCTION PANEL
ALBUMIN: 1.5 g/dL — AB (ref 3.5–5.0)
ANION GAP: 8 (ref 5–15)
BUN: 20 mg/dL (ref 6–20)
CALCIUM: 11.8 mg/dL — AB (ref 8.9–10.3)
CO2: 20 mmol/L — ABNORMAL LOW (ref 22–32)
Chloride: 107 mmol/L (ref 101–111)
Creatinine, Ser: 0.99 mg/dL (ref 0.44–1.00)
GFR, EST NON AFRICAN AMERICAN: 56 mL/min — AB (ref >60)
GLUCOSE: 76 mg/dL (ref 65–99)
PHOSPHORUS: 2.9 mg/dL (ref 2.5–4.6)
POTASSIUM: 3.2 mmol/L — AB (ref 3.5–5.1)
SODIUM: 135 mmol/L (ref 135–145)

## 2017-07-03 MED ORDER — POTASSIUM CHLORIDE CRYS ER 20 MEQ PO TBCR: 40.0000 meq | EXTENDED_RELEASE_TABLET | Freq: Two times a day (BID) | ORAL | Status: DC

## 2017-07-03 MED ORDER — POTASSIUM CHLORIDE 10 MEQ/100ML IV SOLN
10.0000 meq | INTRAVENOUS | Status: AC
  Administered 2017-07-03 (×5): 10 meq via INTRAVENOUS
  Filled 2017-07-03: qty 100

## 2017-07-03 NOTE — Progress Notes (Signed)
Subjective: Interval History: Patient denies any difficulty breathing.  Her appetite is good.  Objective: Vital signs in last 24 hours: Temp:  [97.9 F (36.6 C)-98.3 F (36.8 C)] 98.3 F (36.8 C) (04/08 0605) Pulse Rate:  [67-117] 94 (04/08 0605) Resp:  [16-20] 16 (04/08 0605) BP: (100-123)/(60-73) 100/60 (04/08 0605) SpO2:  [94 %-99 %] 98 % (04/08 0605) Weight:  [50.8 kg (111 lb 15.9 oz)] 50.8 kg (111 lb 15.9 oz) (04/07 2300) Weight change:   Intake/Output from previous day: 04/07 0701 - 04/08 0700 In: -  Out: 4000 [Urine:4000] Intake/Output this shift: No intake/output data recorded.  General appearance: alert, cooperative and no distress Resp: clear to auscultation bilaterally Cardio: regular rate and rhythm, S1, S2 normal, no murmur, click, rub or gallop Extremities: No edema  Lab Results: Recent Labs    07/01/17 0549  WBC 3.6*  HGB 8.3*  HCT 25.3*  PLT 149*   BMET:  Recent Labs    07/02/17 0619 07/03/17 0608  NA 136 135  K 3.6 3.2*  CL 106 107  CO2 20* 20*  GLUCOSE 79 76  BUN 22* 20  CREATININE 1.05* 0.99  CALCIUM 12.8* 11.8*   No results for input(s): PTH in the last 72 hours. Iron Studies: No results for input(s): IRON, TIBC, TRANSFERRIN, FERRITIN in the last 72 hours.  Studies/Results: No results found.  I have reviewed the patient's current medications.  Assessment/Plan: 1] hypercalcemia: Her calcium is progressively improving.  she had 4 L of urine output the last 24 hours.  Her calcium has come down to 11.8.  Corrected for albumin of 1.7 her corrected calcium is around 13.6. 2] renal failure: Seems to be accurate and her renal function has corrected. 3: Hypokalemia: Her potassium is  low today most likely from renal loss. 4] bone and mineral disorder: Her calcium and phosphorus is a range 5] anemia her hemoglobin is 8.3 and stable 6] multiple myeloma in remission 7] hyponatremia: Hypovolemic hyponatremia.  Sodium is normal. Plan: 1]We will  continue with  present management 2] we will start on KCl 40 mEq p.o. twice daily 3] we will check a renal panel in the morning.    LOS: 4 days   Deanna Holloway S 07/03/2017,9:52 AM

## 2017-07-03 NOTE — Progress Notes (Signed)
Order to remove patients Deanna Holloway Catheter. Patient wanted to wait until after lunch for it to be removed. Will re-check with patient here soon to evaluate.

## 2017-07-03 NOTE — Progress Notes (Signed)
Pt bladder scanned. Revealed greater than 825cc in bladder. Stomach distended and firm. Pt stated she can feel pressure. MD paged. Received order for IN and Out Cath. In and Out cath performed. 900cc returned. Stomach feels back to normal. Patient feels better. MD ordered to continue to monitor patient's urinary out put and continue to bladder scan patient over night.  Margaret Pyle, RN

## 2017-07-03 NOTE — Progress Notes (Signed)
[facility]  Patient Status: Active (receiving services up to time of hospitalization)  [facility] is providing the following services: RN, PT, OT and HHA  If patient discharges after hours, please call [phone].   [place] 07/03/2017, 1:04 PM

## 2017-07-03 NOTE — Progress Notes (Signed)
Progress Note    Deanna Holloway  NAT:[id number] DOB: 12-16-1945  DOA: 06/29/2017 PCP: Neale Burly, MD    Brief Narrative:   Chief complaint: follow-up multiple myeloma  Medical records reviewed and are as summarized below:  Deanna Holloway is an 72 y.o. female with a PMH of stage III CKD, multiple myeloma not on treatment and urinary retention with a chronic indwelling Foley catheter as well as recent hospital admission 2 weeks ago for evaluation of hypercalcemia complicated by a fecal impaction/obstipation requiring flexible sigmoidoscopy for disimpaction who was admitted 06/29/17 after being directed to the hospital by her home health for concerns of a UTI. In the ED, urinalysis was consistent with possible infection and serum calcium was 14.9, corrected for low albumin, 16.3. She was also mildly hypotensive with worsening renal function.  Assessment/Plan:   Principal Problem:    Complicated urinary tract infection secondary to Pseudomonas/urinary retention with chronic indwelling Foley Initially put on Rocephin but when cultures grew Pseudomonas, antibiotics changed to Cipro on 07/02/17 and she will complete a 7 day treatment course through 07/09/17. Blood cultures negative to date. I have asked the nursing staff to discontinue her Foley catheter and will monitor for her ability to urinate on her own. She may need the catheter replaced given her history of urinary retention.  Active Problems:   Acute metabolic encephalopathy Secondary to severe  Hypercalcemia. At this point, resolved with treatment of hypercalcemia.    Hypercalcemia of malignancy Secondary to multiple myeloma. Treated with IV fluids and a dose of bisphosphonate on 06/30/17 and now on calcitonin. Calcium has steadily come Holloway and is 11.8 today, corrected to 13.8.    AKI (acute kidney injury) (HCC)/stage III CKD Creatinine 1.4 on admission, Holloway to 0.99 with IV fluids.    Hypotension Blood pressure soft, but  improved.    Multiple myeloma not having achieved remission (HCC)/pancytopenia The patient refuses treatment but is not quite yet ready for hospice and still wants her hypercalcemia treated as needed. She is a DO NOT RESUSCITATE. She would not want to be re-hospitalized per palliative care discussion on 06/30/17.    Pressure injury of skin: Stage II to buttocks Wound care per nursing.    Hypokalemia/hyponatremia Resolved.    Elevated troponin/Grade 2 diastolic dysfunction Troponins have been elevated with a range of 0.28-0.40. Elevation has not been associated with chest pain or acute ischemic changes on EKG and is thought to be from demand ischemia. 2-D echo performed on 07/02/17 and showed an EF of 55-60 percent with grade 2 diastolic dysfunction and no regional wall motion abnormalities. No further cardiac workup indicated at this time. Discontinue telemetry.    Protein calorie malnutrition/Underweight Body mass index is 19.84 kg/m. Will ask dietitian to evaluate.   Family Communication/Anticipated D/C date and plan/Code Status   DVT prophylaxis: Lovenox ordered. Code Status: DO NOT RESUSCITATE.  Family Communication: Female visitor updated at bedside. Disposition Plan: Home later today or tomorrow.   Medical Consultants:    Nephrology  Palliative Care  Oncology   Anti-Infectives:    Rocephin 06/29/17---> 07/01/17  Cipro 07/01/17--->  Subjective:   The patient reports that she is currently not having any pain, nausea, vomiting, or shortness of breath. She does not want to take potassium orally because she had severe nausea with it yesterday.  Objective:    Vitals:   07/02/17 2009 07/02/17 2121 07/02/17 2300 07/03/17 0605  BP:  106/64 111/62 100/60  Pulse:  (!) 101 67 94  Resp:  '20 17 16  ' Temp:  98 F (36.7 C) 97.9 F (36.6 C) 98.3 F (36.8 C)  TempSrc:  Oral Oral Oral  SpO2: 94% 97% 94% 98%  Weight:   50.8 kg (111 lb 15.9 oz)   Height:   '5\' 3"'  (1.6 m)      Intake/Output Summary (Last 24 hours) at 07/03/2017 1610 Last data filed at 07/03/2017 0604 Gross per 24 hour  Intake -  Output 4000 ml  Net -4000 ml   Filed Weights   06/29/17 1611 07/02/17 2300  Weight: 50.8 kg (112 lb) 50.8 kg (111 lb 15.9 oz)    Exam: General: Pale elderly female in no acute distress. Cardiovascular: Heart sounds show a regular rate, and rhythm. No gallops or rubs. No murmurs. No JVD. Lungs: Clear to auscultation bilaterally with good air movement. No rales, rhonchi or wheezes. Abdomen: Soft, mildly distended with normal active bowel sounds. No masses. No hepatosplenomegaly. Neurological: Alert and oriented 3. Moves all extremities 4 with equal strength. Cranial nerves II through XII grossly intact. Skin: Pale.Warm and dry. No rashes or lesions. Extremities: No clubbing or cyanosis. No edema. Pedal pulses 2+. Psychiatric: Mood and affect are depressed/flat. Insight and judgment are poor.   Data Reviewed:   I have personally reviewed following labs and imaging studies:  Labs: Labs show the following:   Basic Metabolic Panel: Recent Labs  Lab 06/29/17 1708 06/30/17 0552 07/01/17 0549 07/02/17 0619 07/03/17 0608  NA 127* 134* 134*  135 136 135  K 3.9 3.7 3.4*  3.4* 3.6 3.2*  CL 95* 99* 103  103 106 107  CO2 '23 24 22  23 ' 20* 20*  GLUCOSE 114* 80 80  82 79 76  BUN 38* 35* 28*  28* 22* 20  CREATININE 1.40* 1.33* 1.16*  1.16* 1.05* 0.99  CALCIUM 14.9* 14.4* 13.8*  13.8* 12.8* 11.8*  PHOS  --   --  3.1 3.0 2.9   GFR Estimated Creatinine Clearance: 41.2 mL/min (by C-G formula based on SCr of 0.99 mg/dL). Liver Function Tests: Recent Labs  Lab 06/29/17 1708 06/30/17 0552 07/01/17 0549 07/02/17 0619 07/03/17 0608  AST 36 38 36  --   --   ALT 15 12* 12*  --   --   ALKPHOS 72 69 65  --   --   BILITOT 0.4 0.4 0.7  --   --   PROT 6.8 6.7 6.2*  --   --   ALBUMIN 1.8* 1.8* 1.7*  1.7* 1.7* 1.5*   Recent Labs  Lab 06/29/17 1708   LIPASE 23   CBC: Recent Labs  Lab 06/29/17 1708 06/30/17 0552 07/01/17 0549  WBC 3.5* 3.1* 3.6*  NEUTROABS 2.1  --   --   HGB 8.8* 8.9* 8.3*  HCT 26.7* 26.9* 25.3*  MCV 91.8 92.1 93.0  PLT 152 149* 149*   Cardiac Enzymes: Recent Labs  Lab 06/29/17 1718 06/30/17 0012 06/30/17 0552 06/30/17 1154  TROPONINI 0.23* 0.28* 0.40* 0.35*   Sepsis Labs: Recent Labs  Lab 06/29/17 1708 06/29/17 1731 06/29/17 1932 06/30/17 0552 07/01/17 0549  WBC 3.5*  --   --  3.1* 3.6*  LATICACIDVEN  --  2.02* 1.41  --   --     Microbiology Recent Results (from the past 240 hour(s))  Urine culture     Status: Abnormal   Collection Time: 06/29/17  4:32 PM  Result Value Ref Range Status   Specimen Description   Final    URINE,  CATHETERIZED Performed at [facility], [address] [place], [place] [address]    Special Requests   Final    Normal Performed at [facility], [address], [place], [place] [address]    Culture >=100,000 COLONIES/mL PSEUDOMONAS AERUGINOSA (A)  Final   Report Status 07/02/2017 FINAL  Final   Organism ID, Bacteria PSEUDOMONAS AERUGINOSA (A)  Final      Susceptibility   Pseudomonas aeruginosa - MIC*    CEFTAZIDIME <=1 SENSITIVE Sensitive     CIPROFLOXACIN <=0.25 SENSITIVE Sensitive     GENTAMICIN <=1 SENSITIVE Sensitive     IMIPENEM 1 SENSITIVE Sensitive     PIP/TAZO <=4 SENSITIVE Sensitive     CEFEPIME <=1 SENSITIVE Sensitive     * >=100,000 COLONIES/mL PSEUDOMONAS AERUGINOSA  Blood Culture (routine x 2)     Status: None (Preliminary result)   Collection Time: 06/29/17  5:52 PM  Result Value Ref Range Status   Specimen Description RIGHT ANTECUBITAL DRAWN BY RN  Final   Special Requests   Final    BOTTLES DRAWN AEROBIC AND ANAEROBIC Blood Culture adequate volume   Culture   Final    NO GROWTH 4 DAYS Performed at [facility], [address], [place], [place] [address]    Report Status PENDING  Incomplete  Blood Culture (routine x 2)     Status: None  (Preliminary result)   Collection Time: 06/29/17  5:52 PM  Result Value Ref Range Status   Specimen Description BLOOD LEFT WRIST  Final   Special Requests   Final    BOTTLES DRAWN AEROBIC AND ANAEROBIC Blood Culture adequate volume   Culture   Final    NO GROWTH 4 DAYS Performed at [facility], [address] [place], [place] [address]    Report Status PENDING  Incomplete    Procedures and diagnostic studies:  No results found.  Medications:   . calcitonin (salmon)  1 spray Alternating Nares Daily  . ciprofloxacin  500 mg Oral BID  . enoxaparin (LOVENOX) injection  40 mg Subcutaneous Q24H   Continuous Infusions: . sodium chloride 135 mL/hr at 07/03/17 0323     LOS: 4 days   Jacquelynn Cree  [facility] Pager [phone]. If unable to reach me by pager, please call my cell phone at [phone].  *Please refer to [email or web address], password [email or web address] to get updated schedule on who will round on this patient, as hospitalists switch teams weekly. If 7PM-7AM, please contact night-coverage at [email or web address], password [email or web address] for any overnight needs.  07/03/2017, 8:21 AM

## 2017-07-03 NOTE — Progress Notes (Addendum)
Nutrition Follow-up  INTERVENTION:   Encouraged PO intake  Pt to provide [place] protein shakes BID   NUTRITION DIAGNOSIS:   Increased nutrient needs related to wound healing as evidenced by estimated needs.  Ongoing  GOAL:   (Meet nutrition needs as patient desires given the severeity of illness and healthcare goals (outlined by palliative medicine).)  Progressing   MONITOR:   PO intake, Labs, Skin, Weight trends  REASON FOR ASSESSMENT:   Consult Assessment of nutrition requirement/status  ASSESSMENT:   Patient presents with multiple myeloma and hypercalcemia. She stopped treatment for her myloma in February. She was 2 weeks ago hospitalization due to obstipation.   Spoke with pt and pt's friend at bedside.  Pt reports a good appetite with no recent changes. Pt reports she has specific items (tea/eggs/oatmeal) that she orders. Meal completion reveals pt is consuming 85-100% of meals at this time.   Pt unsure of any weight changes. Per chart, pt's weight has been declining. Pt with 4 L urine output yesterday.  RD encouraged adequate PO intake of high protein, high calorie food and beverages. Pt states she will only consume [place] Protein drinks (chocolate or vanilla) and that she has her POA bring them from home.   Per chart, ongoing Hospice discussions continue.   Labs reviewed; K 3.2, Albumin 1.5, Hemoglobin 8.3 Medications reviewed; calcitonin, KCl 40 mEq twice daily, NS at 135 mL/hr   Diet Order:  Diet regular Room service appropriate? Yes; Fluid consistency: Thin  EDUCATION NEEDS:   Education needs have been addressed  Skin:  Skin Assessment: Skin Integrity Issues: Skin Integrity Issues:: Stage II, Stage III Stage II: buttocks Stage III: buttocks  Last BM:  4/5  Height:   Ht Readings from Last 1 Encounters:  07/02/17 '5\' 3"'  (1.6 m)   Weight:   Wt Readings from Last 1 Encounters:  07/02/17 111 lb 15.9 oz (50.8 kg)   Ideal Body Weight:  52.2  kg  BMI:  Body mass index is 19.84 kg/m.  Estimated Nutritional Needs:   Kcal:  1500-1700  Protein:  65-70 gr protein  Fluid:  >1500 ml daily  Parks Ranger, MS, RDN, LDN 07/03/2017 11:00 AM

## 2017-07-03 NOTE — Progress Notes (Signed)
Bladder scan patient; >294 mL seen. MD made aware. Will continue to monitor.

## 2017-07-03 NOTE — Care Management Important Message (Signed)
Important Message  Patient Details  Name: Deanna Holloway MRN: [id number] Date of Birth: 1946/03/14   Medicare Important Message Given:  Yes    Shelda Altes 07/03/2017, 12:51 PM

## 2017-07-03 NOTE — Progress Notes (Signed)
Deanna Holloway Catheter removed per MD order. Purewick incontinent catheter placed. Will continue to monitor and bladder scan as needed.

## 2017-07-04 LAB — CBC
HCT: 22.7 % — ABNORMAL LOW (ref 36.0–46.0)
Hemoglobin: 7.3 g/dL — ABNORMAL LOW (ref 12.0–15.0)
MCH: 30.4 pg (ref 26.0–34.0)
MCHC: 32.2 g/dL (ref 30.0–36.0)
MCV: 94.6 fL (ref 78.0–100.0)
PLATELETS: 121 10*3/uL — AB (ref 150–400)
RBC: 2.4 MIL/uL — ABNORMAL LOW (ref 3.87–5.11)
RDW: 15.1 % (ref 11.5–15.5)
WBC: 3.8 10*3/uL — ABNORMAL LOW (ref 4.0–10.5)

## 2017-07-04 LAB — CULTURE, BLOOD (ROUTINE X 2)
CULTURE: NO GROWTH
CULTURE: NO GROWTH
SPECIAL REQUESTS: ADEQUATE
Special Requests: ADEQUATE

## 2017-07-04 LAB — RENAL FUNCTION PANEL
ANION GAP: 9 (ref 5–15)
Albumin: 1.5 g/dL — ABNORMAL LOW (ref 3.5–5.0)
BUN: 20 mg/dL (ref 6–20)
CALCIUM: 11.5 mg/dL — AB (ref 8.9–10.3)
CO2: 18 mmol/L — ABNORMAL LOW (ref 22–32)
Chloride: 107 mmol/L (ref 101–111)
Creatinine, Ser: 1.06 mg/dL — ABNORMAL HIGH (ref 0.44–1.00)
GFR calc non Af Amer: 51 mL/min — ABNORMAL LOW (ref >60)
GFR, EST AFRICAN AMERICAN: 59 mL/min — AB (ref >60)
Glucose, Bld: 89 mg/dL (ref 65–99)
Phosphorus: 2.6 mg/dL (ref 2.5–4.6)
Potassium: 3.4 mmol/L — ABNORMAL LOW (ref 3.5–5.1)
SODIUM: 134 mmol/L — AB (ref 135–145)

## 2017-07-04 LAB — PREPARE RBC (CROSSMATCH)

## 2017-07-04 MED ORDER — SODIUM CHLORIDE 0.9 % IV SOLN: Freq: Once | INTRAVENOUS | Status: DC

## 2017-07-04 MED ORDER — POTASSIUM CHLORIDE 10 MEQ/100ML IV SOLN
10.0000 meq | INTRAVENOUS | Status: AC
  Administered 2017-07-04 (×4): 10 meq via INTRAVENOUS
  Filled 2017-07-04 (×3): qty 100

## 2017-07-04 NOTE — Care Management Note (Signed)
Case Management Note  Patient Details  Name: Deanna Holloway MRN: [id number] Date of Birth: [birth year]  If discussed at Long Length of Stay Meetings, dates discussed:  07/04/2017  Additional Comments:  Sherald Barge, RN 07/04/2017, 1:18 PM

## 2017-07-04 NOTE — Progress Notes (Signed)
Subjective: Interval History: Patient had difficulty in breathing last night form abdominal distension and felt much better form catheter insertion  Objective: Vital signs in last 24 hours: Temp:  [97.9 F (36.6 C)-98.4 F (36.9 C)] 97.9 F (36.6 C) (04/09 0443) Pulse Rate:  [101-116] 101 (04/09 0443) Resp:  [20] 20 (04/08 1416) BP: (89-119)/(60-69) 110/63 (04/09 0443) SpO2:  [92 %-98 %] 96 % (04/09 0443) Weight change:   Intake/Output from previous day: 04/08 0701 - 04/09 0700 In: 10326.5 [P.O.:1670; I.V.:8356.5; IV Piggyback:300] Out: 4350 [Urine:4350] Intake/Output this shift: No intake/output data recorded.  General appearance: alert, cooperative and no distress Resp: clear to auscultation bilaterally Cardio: regular rate and rhythm, S1, S2 normal, no murmur, click, rub or gallop Extremities: No edema  Lab Results: No results for input(s): WBC, HGB, HCT, PLT in the last 72 hours. BMET:  Recent Labs    07/03/17 0608 07/04/17 0432  NA 135 134*  K 3.2* 3.4*  CL 107 107  CO2 20* 18*  GLUCOSE 76 89  BUN 20 20  CREATININE 0.99 1.06*  CALCIUM 11.8* 11.5*   No results for input(s): PTH in the last 72 hours. Iron Studies: No results for input(s): IRON, TIBC, TRANSFERRIN, FERRITIN in the last 72 hours.  Studies/Results: No results found.  I have reviewed the patient's current medications.  Assessment/Plan: 1] hypercalcemia: Her calcium is progressively improving.  she had 4 L of urine output the last 24 hours.  Her calcium has come down to 11.5.  Corrected for albumin of 1.7 her corrected calcium is around 13.3. Only slight declined. 2] renal failure: Seems to be accurate and her renal function has corrected. Patient seems to have obstructive uropathy requiring folly cath insertion 3: Hypokalemia: Her potassium is  low but improving. She is on potassium supplement 4] bone and mineral disorder: Her calcium and phosphorus is a range 5] anemia her hemoglobin is low  butstable 6] multiple myeloma in remission 7] hyponatremia: Hypovolemic hyponatremia.  Sodium is normal. Plan: 1]We will continue with  present management 2] Possibly patient may require urology consult 3] we will check a renal panel in the morning.    LOS: 5 days   Taziyah Iannuzzi S 07/04/2017,7:41 AM

## 2017-07-04 NOTE — Progress Notes (Signed)
Bladder scan completed at midnight. Revealed greater than 975cc in bladder. Abdomen was very distended again. MD paged. Order for foley received. Foley placed. Pt satisfied.  Pt is having frequent BM's. Very small and continuous ooze. Pt cannot tell when she has had a BM. Wounds are becoming worse due to this.Foams placed and cream placed.  Margaret Pyle, RN

## 2017-07-04 NOTE — Progress Notes (Signed)
Progress Note    Deanna Holloway  FVC:[id number] DOB: 09/30/1945  DOA: 06/29/2017 PCP: Neale Burly, MD    Brief Narrative:   Chief complaint: follow-up multiple myeloma  Medical records reviewed and are as summarized below:  Deanna Holloway is an 72 y.o. female with a PMH of stage III CKD, multiple myeloma not on treatment and urinary retention with a chronic indwelling Foley catheter as well as recent hospital admission 2 weeks ago for evaluation of hypercalcemia complicated by a fecal impaction/obstipation requiring flexible sigmoidoscopy for disimpaction who was admitted 06/29/17 after being directed to the hospital by her home health for concerns of a UTI. In the ED, urinalysis was consistent with possible infection and serum calcium was 14.9, corrected for low albumin, 16.3. She was also mildly hypotensive with worsening renal function.  Assessment/Plan:   Principal Problem:    Complicated urinary tract infection secondary to Pseudomonas/urinary retention with chronic indwelling Foley Initially put on Rocephin but when cultures grew Pseudomonas, antibiotics changed to Cipro on 07/02/17 and she will complete a 7 day treatment course through 07/09/17. Blood cultures negative to date.  Foley discontinued 07/03/17 but had to be replaced when the patient developed recurrent urinary retention.  Active Problems:   Acute metabolic encephalopathy Secondary to severe  Hypercalcemia. At this point, resolved with treatment of hypercalcemia.    Hypercalcemia of malignancy Secondary to multiple myeloma. Treated with IV fluids and a dose of bisphosphonate on 06/30/17 and now on calcitonin. Calcium has steadily come down and is 11.5 today.    AKI (acute kidney injury) (HCC)/stage III CKD Creatinine 1.4 on admission, currently 1.06.    Hypotension Blood pressure soft, but improved.    Multiple myeloma not having achieved remission (HCC)/pancytopenia The patient refuses treatment but is not  quite yet ready for hospice and still wants her hypercalcemia treated as needed. She is a DO NOT RESUSCITATE. She would not want to be re-hospitalized per palliative care discussion on 06/30/17. Wanted CBC checked prior to d/c in case she needed blood, and hgb 7.3. Will give 1 unit of PRBC per patient request. Despite spending a lot of time with her and her POA answering her questions, both she and her POA are very distrustful of the medical care she has received, and have competing goals for treatment. Declines treatment, but wants blood products and medications to reduce calcium.  Wants hospice, but wants to come to hospital for blood transfusion. I recommend palliative care have ongoing discussions with her to clarify these options.    Pressure injury of skin: Stage II to buttocks Wound care per nursing.    Hypokalemia/hyponatremia Potassium slightly low today.  We will give another 4 runs of IV potassium.  Patient cannot tolerate oral.    Elevated troponin/Grade 2 diastolic dysfunction Troponins have been elevated with a range of 0.28-0.40. Elevation has not been associated with chest pain or acute ischemic changes on EKG and is thought to be from demand ischemia. 2-D echo performed on 07/02/17 and showed an EF of 55-60 percent with grade 2 diastolic dysfunction and no regional wall motion abnormalities. No further cardiac workup indicated at this time.  Telemetry discontinued 07/03/17.    Protein calorie malnutrition/Underweight Body mass index is 19.84 kg/m.  Evaluated by dietitian.  Continue protein shakes twice daily.   Family Communication/Anticipated D/C date and plan/Code Status   DVT prophylaxis: Lovenox ordered. Code Status: DO NOT RESUSCITATE.  Family Communication: Female visitor updated at bedside. Disposition Plan: Home (?  With Hospice) 07/05/17.   Medical Consultants:    Nephrology  Palliative Care  Oncology   Anti-Infectives:    Rocephin 06/29/17---> 07/01/17  Cipro  07/01/17--->  Subjective:   The patient reports that she is feeling OK, but had a lot of pain last night with bladder distention and inability to void. It appears there was a delay in re-inserting the catheter despite my discussing the need to reinsert the catheter should she develop recurrent urinary retention due to some sort of nursing protocol.   Objective:    Vitals:   07/03/17 2115 07/03/17 2145 07/04/17 0443 07/04/17 0948  BP:  119/69 110/63   Pulse:  (!) 116 (!) 101   Resp:      Temp:  98.2 F (36.8 C) 97.9 F (36.6 C)   TempSrc:  Oral Oral   SpO2: 92% 95% 96% 95%  Weight:      Height:        Intake/Output Summary (Last 24 hours) at 07/04/2017 1333 Last data filed at 07/04/2017 0952 Gross per 24 hour  Intake 9596.5 ml  Output 2850 ml  Net 6746.5 ml   Filed Weights   06/29/17 1611 07/02/17 2300  Weight: 50.8 kg (112 lb) 50.8 kg (111 lb 15.9 oz)    Exam: General: No acute distress. Cardiovascular: Heart sounds show a regular rate, and rhythm. No gallops or rubs. No murmurs. No JVD. Lungs: Clear to auscultation bilaterally with good air movement. No rales, rhonchi or wheezes. Abdomen: Soft, nontender, nondistended with normal active bowel sounds. No masses. No hepatosplenomegaly. Skin: Warm and dry. No rashes or lesions. Extremities: No clubbing or cyanosis. No edema. Pedal pulses 2+.   Data Reviewed:   I have personally reviewed following labs and imaging studies:  Labs: Labs show the following:   Basic Metabolic Panel: Recent Labs  Lab 06/30/17 0552 07/01/17 0549 07/02/17 0619 07/03/17 0608 07/04/17 0432  NA 134* 134*  135 136 135 134*  K 3.7 3.4*  3.4* 3.6 3.2* 3.4*  CL 99* 103  103 106 107 107  CO2 '24 22  23 ' 20* 20* 18*  GLUCOSE 80 80  82 79 76 89  BUN 35* 28*  28* 22* 20 20  CREATININE 1.33* 1.16*  1.16* 1.05* 0.99 1.06*  CALCIUM 14.4* 13.8*  13.8* 12.8* 11.8* 11.5*  PHOS  --  3.1 3.0 2.9 2.6   GFR Estimated Creatinine Clearance: 38.5  mL/min (A) (by C-G formula based on SCr of 1.06 mg/dL (H)). Liver Function Tests: Recent Labs  Lab 06/29/17 1708 06/30/17 0552 07/01/17 0549 07/02/17 0619 07/03/17 0608 07/04/17 0432  AST 36 38 36  --   --   --   ALT 15 12* 12*  --   --   --   ALKPHOS 72 69 65  --   --   --   BILITOT 0.4 0.4 0.7  --   --   --   PROT 6.8 6.7 6.2*  --   --   --   ALBUMIN 1.8* 1.8* 1.7*  1.7* 1.7* 1.5* 1.5*   Recent Labs  Lab 06/29/17 1708  LIPASE 23   CBC: Recent Labs  Lab 06/29/17 1708 06/30/17 0552 07/01/17 0549 07/04/17 0432  WBC 3.5* 3.1* 3.6* 3.8*  NEUTROABS 2.1  --   --   --   HGB 8.8* 8.9* 8.3* 7.3*  HCT 26.7* 26.9* 25.3* 22.7*  MCV 91.8 92.1 93.0 94.6  PLT 152 149* 149* 121*   Cardiac Enzymes: Recent Labs  Lab 06/29/17 1718 06/30/17 0012 06/30/17 0552 06/30/17 1154  TROPONINI 0.23* 0.28* 0.40* 0.35*   Sepsis Labs: Recent Labs  Lab 06/29/17 1708 06/29/17 1731 06/29/17 1932 06/30/17 0552 07/01/17 0549 07/04/17 0432  WBC 3.5*  --   --  3.1* 3.6* 3.8*  LATICACIDVEN  --  2.02* 1.41  --   --   --     Microbiology Recent Results (from the past 240 hour(s))  Urine culture     Status: Abnormal   Collection Time: 06/29/17  4:32 PM  Result Value Ref Range Status   Specimen Description   Final    URINE, CATHETERIZED Performed at [facility], [address] [place], [place] [address]    Special Requests   Final    Normal Performed at [facility], [address], [place], [place] [address]    Culture >=100,000 COLONIES/mL PSEUDOMONAS AERUGINOSA (A)  Final   Report Status 07/02/2017 FINAL  Final   Organism ID, Bacteria PSEUDOMONAS AERUGINOSA (A)  Final      Susceptibility   Pseudomonas aeruginosa - MIC*    CEFTAZIDIME <=1 SENSITIVE Sensitive     CIPROFLOXACIN <=0.25 SENSITIVE Sensitive     GENTAMICIN <=1 SENSITIVE Sensitive     IMIPENEM 1 SENSITIVE Sensitive     PIP/TAZO <=4 SENSITIVE Sensitive     CEFEPIME <=1 SENSITIVE Sensitive     * >=100,000 COLONIES/mL  PSEUDOMONAS AERUGINOSA  Blood Culture (routine x 2)     Status: None   Collection Time: 06/29/17  5:52 PM  Result Value Ref Range Status   Specimen Description RIGHT ANTECUBITAL DRAWN BY RN  Final   Special Requests   Final    BOTTLES DRAWN AEROBIC AND ANAEROBIC Blood Culture adequate volume   Culture   Final    NO GROWTH 5 DAYS Performed at [facility], [address], [place],  [address]    Report Status 07/04/2017 FINAL  Final  Blood Culture (routine x 2)     Status: None   Collection Time: 06/29/17  5:52 PM  Result Value Ref Range Status   Specimen Description BLOOD LEFT WRIST  Final   Special Requests   Final    BOTTLES DRAWN AEROBIC AND ANAEROBIC Blood Culture adequate volume   Culture   Final    NO GROWTH 5 DAYS Performed at [facility], [address] [place],  [address]    Report Status 07/04/2017 FINAL  Final    Procedures and diagnostic studies:  No results found.  Medications:   . calcitonin (salmon)  1 spray Alternating Nares Daily  . ciprofloxacin  500 mg Oral BID  . enoxaparin (LOVENOX) injection  40 mg Subcutaneous Q24H   Continuous Infusions: . sodium chloride 135 mL/hr at 07/04/17 0531     LOS: 5 days   Jacquelynn Cree  [facility] Pager [phone]. If unable to reach me by pager, please call my cell phone at [phone].  *Please refer to [email or web address], password [email or web address] to get updated schedule on who will round on this patient, as hospitalists switch teams weekly. If 7PM-7AM, please contact night-coverage at [email or web address], password [email or web address] for any overnight needs.  07/04/2017, 1:33 PM

## 2017-07-05 DIAGNOSIS — N390 Urinary tract infection, site not specified: Secondary | ICD-10-CM

## 2017-07-05 DIAGNOSIS — B965 Pseudomonas (aeruginosa) (mallei) (pseudomallei) as the cause of diseases classified elsewhere: Secondary | ICD-10-CM

## 2017-07-05 LAB — BPAM RBC
Blood Product Expiration Date: 201904282359
ISSUE DATE / TIME: 201904092012
UNIT TYPE AND RH: 6200

## 2017-07-05 LAB — RENAL FUNCTION PANEL
ALBUMIN: 1.6 g/dL — AB (ref 3.5–5.0)
ANION GAP: 10 (ref 5–15)
BUN: 19 mg/dL (ref 6–20)
CALCIUM: 11.5 mg/dL — AB (ref 8.9–10.3)
CO2: 18 mmol/L — ABNORMAL LOW (ref 22–32)
Chloride: 108 mmol/L (ref 101–111)
Creatinine, Ser: 0.93 mg/dL (ref 0.44–1.00)
GFR calc non Af Amer: 60 mL/min — ABNORMAL LOW (ref >60)
Glucose, Bld: 90 mg/dL (ref 65–99)
POTASSIUM: 3.8 mmol/L (ref 3.5–5.1)
Phosphorus: 2.9 mg/dL (ref 2.5–4.6)
SODIUM: 136 mmol/L (ref 135–145)

## 2017-07-05 LAB — TYPE AND SCREEN
ABO/RH(D): A POS
Antibody Screen: NEGATIVE
UNIT DIVISION: 0

## 2017-07-05 LAB — CBC
HEMATOCRIT: 28.9 % — AB (ref 36.0–46.0)
HEMOGLOBIN: 9.6 g/dL — AB (ref 12.0–15.0)
MCH: 30 pg (ref 26.0–34.0)
MCHC: 33.2 g/dL (ref 30.0–36.0)
MCV: 90.3 fL (ref 78.0–100.0)
Platelets: 129 10*3/uL — ABNORMAL LOW (ref 150–400)
RBC: 3.2 MIL/uL — AB (ref 3.87–5.11)
RDW: 16.8 % — ABNORMAL HIGH (ref 11.5–15.5)
WBC: 4.7 10*3/uL (ref 4.0–10.5)

## 2017-07-05 MED ORDER — CIPROFLOXACIN HCL 500 MG PO TABS: 500.0000 mg | ORAL_TABLET | Freq: Two times a day (BID) | ORAL | 0 refills | Status: AC

## 2017-07-05 NOTE — Progress Notes (Signed)
Deanna Holloway  MRN: [id number]  DOB/AGE: 04/28/1945 72 y.o.  Primary Care Physician:Hasanaj, Samul Dada, MD  Admit date: 06/29/2017  Chief Complaint:  Chief Complaint  Patient presents with  . Weakness    S-Pt presented on  06/29/2017 with  Chief Complaint  Patient presents with  . Weakness  .    Pt offers no new complaints.    Pt main concern was " I got blood yesterday night, I am just feeling tired"   Meds . calcitonin (salmon)  1 spray Alternating Nares Daily  . ciprofloxacin  500 mg Oral BID  . enoxaparin (LOVENOX) injection  40 mg Subcutaneous Q24H       Physical Exam: Vital signs in last 24 hours: Temp:  [97.7 F (36.5 C)-98.7 F (37.1 C)] 98.1 F (36.7 C) (04/10 0444) Pulse Rate:  [79-102] 101 (04/10 0444) Resp:  [14-20] 14 (04/10 0444) BP: (97-159)/(61-97) 118/71 (04/10 0444) SpO2:  [95 %-98 %] 96 % (04/10 0444) Weight change:  Last BM Date: 07/04/17  Intake/Output from previous day: 04/09 0701 - 04/10 0700 In: 3691.8 [P.O.:480; I.V.:2896.8; Blood:315] Out: 4400 [Urine:4400] No intake/output data recorded.   Physical Exam: General- pt is awake,alert, oriented to time place and person Resp- No acute REsp distress, CTA B/L NO Rhonchi CVS- S1S2 regular in rate and rhythm GIT- BS+, soft, NT, ND EXT- NO LE Edema, No Cyanosis   Lab Results: CBC Recent Labs    07/04/17 0432  WBC 3.8*  HGB 7.3*  HCT 22.7*  PLT 121*    BMET Recent Labs    07/04/17 0432 07/05/17 0630  NA 134* 136  K 3.4* 3.8  CL 107 108  CO2 18* 18*  GLUCOSE 89 90  BUN 20 19  CREATININE 1.06* 0.93  CALCIUM 11.5* 11.5*    Creat trend 2019   1.4==> 1.06=> 0.9 2018  1.1--2.9   Calcium trend 2019  14.9=> 11.5   MICRO Recent Results (from the past 240 hour(s))  Urine culture     Status: Abnormal   Collection Time: 06/29/17  4:32 PM  Result Value Ref Range Status   Specimen Description   Final    URINE, CATHETERIZED Performed at [facility], [address], [place], [place] [address]    Special Requests   Final    Normal Performed at [facility], [address] [place], [place] [address]    Culture >=100,000 COLONIES/mL PSEUDOMONAS AERUGINOSA (A)  Final   Report Status 07/02/2017 FINAL  Final   Organism ID, Bacteria PSEUDOMONAS AERUGINOSA (A)  Final      Susceptibility   Pseudomonas aeruginosa - MIC*    CEFTAZIDIME <=1 SENSITIVE Sensitive     CIPROFLOXACIN <=0.25 SENSITIVE Sensitive     GENTAMICIN <=1 SENSITIVE Sensitive     IMIPENEM 1 SENSITIVE Sensitive     PIP/TAZO <=4 SENSITIVE Sensitive     CEFEPIME <=1 SENSITIVE Sensitive     * >=100,000 COLONIES/mL PSEUDOMONAS AERUGINOSA  Blood Culture (routine x 2)     Status: None   Collection Time: 06/29/17  5:52 PM  Result Value Ref Range Status   Specimen Description RIGHT ANTECUBITAL DRAWN BY RN  Final   Special Requests   Final    BOTTLES DRAWN AEROBIC AND ANAEROBIC Blood Culture adequate volume   Culture   Final    NO GROWTH 5 DAYS Performed at [facility], [address] [place], [place] [address]    Report Status 07/04/2017 FINAL  Final  Blood Culture (routine x 2)  Status: None   Collection Time: 06/29/17  5:52 PM  Result Value Ref Range Status   Specimen Description BLOOD LEFT WRIST  Final   Special Requests   Final    BOTTLES DRAWN AEROBIC AND ANAEROBIC Blood Culture adequate volume   Culture   Final    NO GROWTH 5 DAYS Performed at [facility], [address], [place], [place] [address]    Report Status 07/04/2017 FINAL  Final      Lab Results  Component Value Date   CALCIUM 11.5 (H) 07/05/2017   CAION 1.83 (HH) 04/06/2017   PHOS 2.9 07/05/2017               Impression: 1)Renal  AKI secondary to Prerenal                    Afferrent vasoconstriction sec to Hypercalcemia                AKI on CKD               CKD stage 3.               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                Progression of CKD marked with AKI                  2)HTN  BP stable    3)Anemia HGb low Primary team and Oncology following   4)Hypercalcemia sec to Multiple Myeloma Calcium now better Pt recived IVF + Biphosphonates + Calcitonin  5)ONcology-Hx of Multiple Myeloma ONcology and Primary MD following  6)Electrolytes  Hypokalemic   Better  Hyponatremic   Better   7)Acid base Co2 at goal     Plan:   Will continue current care   [address] 07/05/2017, 9:21 AM

## 2017-07-05 NOTE — Progress Notes (Signed)
Patient and family states understanding of discharge instructions 
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
## 2017-07-05 NOTE — Discharge Summary (Signed)
Physician Discharge Summary  Deanna Holloway OEH:[id number] DOB: 1945/06/26 DOA: 06/29/2017  PCP: Neale Burly, MD  Admit date: 06/29/2017  Discharge date: 07/05/2017  Admitted From:Home  Disposition:  Home  Recommendations for Outpatient Follow-up:  1. Follow up with PCP in 1-2 weeks 2. Follow up with Urology as scheduled in 5-7 days for Foley management  Home Health:N/A  Equipment/Devices:N/A  Discharge Condition:Stable  CODE STATUS: DNR  Diet recommendation: Heart Healthy  Brief/Interim Summary:  Deanna Holloway is an 72 y.o. female with a PMH of stage III CKD, multiple myeloma not on treatment and urinary retention with a chronic indwelling Foley catheter as well as recent hospital admission 2 weeks ago for evaluation of hypercalcemia complicated by a fecal impaction/obstipation requiring flexible sigmoidoscopy for disimpaction who was admitted 06/29/17 after being directed to the hospital by her home health for concerns of a UTI. In the ED, urinalysis was consistent with possible infection and serum calcium was 14.9, corrected for low albumin, 16.3. She was also mildly hypotensive with worsening renal function.  She has been seen alongside nephrology with improvement to her kidney function noted as well as her hypercalcemia with the use of IV fluid, and bisphosphonates.  She remains on calcitonin at this time and this is thought to be related to her multiple myeloma.  She currently continues to have urinary retention for which Foley catheter remains with recommendations to follow-up with urology.  This appears to be related to her Pseudomonas UTI for which she will remain on ciprofloxacin for 4 more days.   Discharge Diagnoses:  Principal Problem:   Pseudomonas urinary tract infection Active Problems:   Hypercalcemia   AKI (acute kidney injury) ([place])   Hypotension   Multiple myeloma not having achieved remission (HCC)   Hypokalemia   CKD (chronic kidney disease) stage 3, GFR  30-59 ml/min (HCC)   Hypercalcemia of malignancy   DNR (do not resuscitate)   Encounter for hospice care discussion   Goals of care, counseling/discussion   Palliative care by specialist   Pressure ulcer with suspected deep tissue injury, stage II to buttocks   Pancytopenia ([place])   Severe protein-calorie malnutrition ([place])   Underweight   Demand ischemia ([place])   Elevated troponin   Grade II diastolic dysfunction   Acute metabolic encephalopathy   Hyponatremia   1. Acute metabolic encephalopathy secondary to Pseudomonas UTI with hypercalcemia of malignancy secondary to multiple myeloma.  Continue treatment of Pseudomonas UTI with ciprofloxacin for 4 more days to complete a 7-day treatment course.  Foley will be continued for now with recommendations to follow-up with urology in the near future in the outpatient setting.  Additionally, hypercalcemia has improved markedly with IV fluid and bisphosphonates.  She is to continue her calcitonin. 2. AK I on CKD stage III.  She is back to her usual baseline at this time.  Follow-up with nephrology as needed. 3. Multiple myeloma with pancytopenia.  Patient has been seen by palliative care, but is not quite ready for hospice at this time.  She was given PRBCs during this admission with her anemia.  They would like to have ongoing discussions regarding hospice care in the future.  At this time, she is DNR. 4. Hypokalemia/hyponatremia.  Resolved with repletion. 5. Grade 2 diastolic dysfunction.  Patient is currently euvolemic. 6. Protein calorie malnutrition.  Evaluated by dietitian with recommendations to continue protein shakes twice a day.  Discharge Instructions  Discharge Instructions    Diet - low sodium heart healthy  Complete by:  As directed    Increase activity slowly   Complete by:  As directed      Allergies as of 07/05/2017      Reactions   Lasix [furosemide] Swelling   Adhesive [tape] Rash   Other    Antihistamine and steroids    Velcade [bortezomib]    Other.  Adverse reaction; POA states "and steroid that went with Velcade."   Nickel Rash      Medication List    TAKE these medications   acetaminophen 500 MG tablet Commonly known as:  TYLENOL Take 500 mg by mouth every 6 (six) hours as needed for moderate pain.   calcitonin (salmon) 200 UNIT/ACT nasal spray Commonly known as:  MIACALCIN/FORTICAL Place 1 spray into alternate nostrils daily.   ciprofloxacin 500 MG tablet Commonly known as:  CIPRO Take 1 tablet (500 mg total) by mouth 2 (two) times daily for 4 days.   Magnesium 200 MG Tabs Take 1 tablet by mouth 2 (two) times daily.   multivitamin with minerals tablet Take 2 tablets by mouth daily.      Follow-up Information    Neale Burly, MD Follow up in 1 week(s).   Specialty:  Internal Medicine Contact information: [place] [address] [phone]          Allergies  Allergen Reactions  . Lasix [Furosemide] Swelling  . Adhesive [Tape] Rash  . Other     Antihistamine and steroids   . Velcade [Bortezomib]     Other.  Adverse reaction; POA states "and steroid that went with Velcade."  . Nickel Rash    Consultations:  Nephrology   Procedures/Studies: Ct Pelvis Wo Contrast  Result Date: 06/14/2017 CLINICAL DATA:  Nausea and constipation.  Evaluate for fistula. EXAM: CT PELVIS WITHOUT CONTRAST TECHNIQUE: Multidetector CT imaging of the pelvis was performed following the standard protocol without intravenous contrast. COMPARISON:  04/06/2017 FINDINGS: Urinary Tract:  Bladder decompressed by Foley catheter. Bowel: Enema tip is identified in the rectum with large stool volume in the rectum. Rectum is opacified consistent with retrograde placement of rectal contrast material. Sagittal image 55 of series 5 shows a thin linear tract of contrast passing from the anterior wall of the rectum to the posterior wall the vagina. The vagina is filled with gas and debris and  contrast material is seen in the upper vaginal vault. There is more contrast in the upper vagina than in the lower vagina suggesting migration of contrast through a fistula rather than reflux of contrast into the vagina. Vascular/Lymphatic: Unremarkable. Reproductive:  As above. Other:  No substantial intraperitoneal free fluid. Musculoskeletal: Diffuse body wall edema evident. Stable appearance of heterogeneous bone mineralization. IMPRESSION: Imaging features compatible with rectovaginal fistula. The fistula track is demonstrated on sagittal reconstructions. Diffuse body wall edema. Electronically Signed   By: Misty Stanley M.D.   On: 06/14/2017 10:18   Dg Abdomen Acute W/chest  Result Date: 06/29/2017 CLINICAL DATA:  Abdominal pain and weakness. EXAM: DG ABDOMEN ACUTE W/ 1V CHEST COMPARISON:  Abdominal x-ray dated June 13, 2017. FINDINGS: The cardiomediastinal silhouette is normal in size. Normal pulmonary vascularity. No focal consolidation, pleural effusion, or pneumothorax. No acute osseous abnormality. Healed fracture of the left proximal humerus. Stable gaseous colonic distention with overall unchanged moderate colonic stool burden. Large amount of stool again seen within the rectum. No dilated loops of small bowel. No pneumoperitoneum. IMPRESSION: 1. Unchanged moderate colonic stool burden and large amount of stool  within the rectum. Correlate for fecal impaction. 2. No bowel obstruction. 3.  No active cardiopulmonary disease. Electronically Signed   By: Titus Dubin M.D.   On: 06/29/2017 17:50   Dg Abdomen Acute W/chest  Result Date: 06/12/2017 CLINICAL DATA:  72 year old female with nausea and constipation. Multiple myeloma. EXAM: DG ABDOMEN ACUTE W/ 1V CHEST COMPARISON:  CT Abdomen and Pelvis 04/06/2017 and earlier. FINDINGS: AP upright view of the chest. The lungs appear clear. No pneumothorax or pneumoperitoneum. Stable cardiac size and mediastinal contours. Visualized tracheal air column is  within normal limits. Rounded masslike soft tissue contour projecting into the lower abdomen from the pelvis when correlated with the January CT Abdomen and Pelvis likely represents severe urinary bladder distension. Prominent gas-filled transverse colon, but overall nonobstructed bowel-gas pattern. Retained stool in the rectum. Diffuse severe osteopenia in keeping with widespread multiple myeloma involvement. Probable pathologic fracture of the right lateral 6th rib. Stable proximal left humerus deformity. IMPRESSION: 1. Severe distension of the urinary bladder suspected. Ultrasound could confirm. 2. Nonobstructed bowel gas pattern, but possible fecal impaction, and moderate retained stool elsewhere in the colon. 3. No free air. 4.  No acute cardiopulmonary abnormality. 5. Diffuse skeletal multiple myeloma. Electronically Signed   By: Genevie Ann M.D.   On: 06/12/2017 14:06   Dg Abd Portable 1v  Result Date: 06/13/2017 CLINICAL DATA:  Fecal impaction. EXAM: PORTABLE ABDOMEN - 1 VIEW COMPARISON:  Radiographs yesterday. FINDINGS: Decreased pelvic soft tissue prominence, likely decompressed urinary bladder. Stool distending the rectum persists, no significant interval change. Similar moderate stool burden in the more proximal colon. Slight increase in gaseous colonic distention. No small bowel dilatation or evidence of obstruction. No evidence of free air on supine view. Diffuse bony under mineralization. IMPRESSION: Similar rectal distention with stool suspicious for fecal impaction. Similar stool burden proximally, with slight increase gaseous colonic distention. No small bowel dilatation or obstruction. Electronically Signed   By: Jeb Levering M.D.   On: 06/13/2017 06:11    Discharge Exam: Vitals:   07/05/17 0206 07/05/17 0444  BP: (!) 159/97 118/71  Pulse: 90 (!) 101  Resp: 20 14  Temp: 98.7 F (37.1 C) 98.1 F (36.7 C)  SpO2: 96% 96%   Vitals:   07/04/17 2113 07/04/17 2312 07/05/17 0206  07/05/17 0444  BP:  108/71 (!) 159/97 118/71  Pulse:  (!) 102 90 (!) 101  Resp:   20 14  Temp:  98.7 F (37.1 C) 98.7 F (37.1 C) 98.1 F (36.7 C)  TempSrc:  Oral Oral Oral  SpO2: 96% 96% 96% 96%  Weight:      Height:        General: Pt is alert, awake, not in acute distress; foley with clear, yellow, UO Cardiovascular: RRR, S1/S2 +, no rubs, no gallops Respiratory: CTA bilaterally, no wheezing, no rhonchi Abdominal: Soft, NT, ND, bowel sounds + Extremities: no edema, no cyanosis    The results of significant diagnostics from this hospitalization (including imaging, microbiology, ancillary and laboratory) are listed below for reference.     Microbiology: Recent Results (from the past 240 hour(s))  Urine culture     Status: Abnormal   Collection Time: 06/29/17  4:32 PM  Result Value Ref Range Status   Specimen Description   Final    URINE, CATHETERIZED Performed at [facility], [address] [place], [place] [address]    Special Requests   Final    Normal Performed at [facility], [address], [place],  [place] [address]    Culture >=100,000 COLONIES/mL PSEUDOMONAS AERUGINOSA (A)  Final   Report Status 07/02/2017 FINAL  Final   Organism ID, Bacteria PSEUDOMONAS AERUGINOSA (A)  Final      Susceptibility   Pseudomonas aeruginosa - MIC*    CEFTAZIDIME <=1 SENSITIVE Sensitive     CIPROFLOXACIN <=0.25 SENSITIVE Sensitive     GENTAMICIN <=1 SENSITIVE Sensitive     IMIPENEM 1 SENSITIVE Sensitive     PIP/TAZO <=4 SENSITIVE Sensitive     CEFEPIME <=1 SENSITIVE Sensitive     * >=100,000 COLONIES/mL PSEUDOMONAS AERUGINOSA  Blood Culture (routine x 2)     Status: None   Collection Time: 06/29/17  5:52 PM  Result Value Ref Range Status   Specimen Description RIGHT ANTECUBITAL DRAWN BY RN  Final   Special Requests   Final    BOTTLES DRAWN AEROBIC AND ANAEROBIC Blood Culture adequate volume   Culture   Final    NO GROWTH 5 DAYS Performed at [facility], [address], [place], [place] [address]    Report Status 07/04/2017 FINAL  Final  Blood Culture (routine x 2)     Status: None   Collection Time: 06/29/17  5:52 PM  Result Value Ref Range Status   Specimen Description BLOOD LEFT WRIST  Final   Special Requests   Final    BOTTLES DRAWN AEROBIC AND ANAEROBIC Blood Culture adequate volume   Culture   Final    NO GROWTH 5 DAYS Performed at [facility], [address] [place], [place] [address]    Report Status 07/04/2017 FINAL  Final     Labs: BNP (last 3 results) No results for input(s): BNP in the last 8760 hours. Basic Metabolic Panel: Recent Labs  Lab 07/01/17 0549 07/02/17 0619 07/03/17 0608 07/04/17 0432 07/05/17 0630  NA 134*  135 136 135 134* 136  K 3.4*  3.4* 3.6 3.2* 3.4* 3.8  CL 103  103 106 107 107 108  CO2 22  23 20* 20* 18* 18*  GLUCOSE 80  82 79 76 89 90  BUN 28*  28* 22* '20 20 19  ' CREATININE 1.16*  1.16* 1.05* 0.99 1.06* 0.93  CALCIUM 13.8*  13.8* 12.8* 11.8* 11.5* 11.5*  PHOS 3.1 3.0 2.9 2.6 2.9   Liver Function Tests: Recent Labs  Lab 06/29/17 1708 06/30/17 0552 07/01/17 0549 07/02/17 0619 07/03/17 0608 07/04/17 0432 07/05/17 0630  AST 36 38 36  --   --   --   --   ALT 15 12* 12*  --   --   --   --   ALKPHOS 72 69 65  --   --   --   --   BILITOT 0.4 0.4 0.7  --   --   --   --   PROT 6.8 6.7 6.2*  --   --   --   --   ALBUMIN 1.8* 1.8* 1.7*  1.7* 1.7* 1.5* 1.5* 1.6*   Recent Labs  Lab 06/29/17 1708  LIPASE 23   No results for input(s): AMMONIA in the last 168 hours. CBC: Recent Labs  Lab 06/29/17 1708 06/30/17 0552 07/01/17 0549 07/04/17 0432 07/05/17 0630  WBC 3.5* 3.1* 3.6* 3.8* 4.7  NEUTROABS 2.1  --   --   --   --   HGB 8.8* 8.9* 8.3* 7.3* 9.6*  HCT 26.7* 26.9* 25.3* 22.7* 28.9*  MCV 91.8 92.1 93.0 94.6 90.3  PLT 152 149* 149* 121* 129*  Cardiac Enzymes: Recent Labs  Lab 06/29/17 1718 06/30/17 0012 06/30/17 0552 06/30/17 1154  TROPONINI 0.23* 0.28* 0.40* 0.35*    BNP: Invalid input(s): POCBNP CBG: No results for input(s): GLUCAP in the last 168 hours. D-Dimer No results for input(s): DDIMER in the last 72 hours. Hgb A1c No results for input(s): HGBA1C in the last 72 hours. Lipid Profile No results for input(s): CHOL, HDL, LDLCALC, TRIG, CHOLHDL, LDLDIRECT in the last 72 hours. Thyroid function studies No results for input(s): TSH, T4TOTAL, T3FREE, THYROIDAB in the last 72 hours.  Invalid input(s): FREET3 Anemia work up No results for input(s): VITAMINB12, FOLATE, FERRITIN, TIBC, IRON, RETICCTPCT in the last 72 hours. Urinalysis    Component Value Date/Time   COLORURINE YELLOW 06/29/2017 1632   APPEARANCEUR TURBID (A) 06/29/2017 1632   LABSPEC 1.008 06/29/2017 1632   PHURINE 7.0 06/29/2017 1632   GLUCOSEU NEGATIVE 06/29/2017 1632   HGBUR MODERATE (A) 06/29/2017 1632   BILIRUBINUR NEGATIVE 06/29/2017 1632   KETONESUR NEGATIVE 06/29/2017 1632   PROTEINUR 30 (A) 06/29/2017 1632   NITRITE NEGATIVE 06/29/2017 1632   LEUKOCYTESUR LARGE (A) 06/29/2017 1632   Sepsis Labs Invalid input(s): PROCALCITONIN,  WBC,  LACTICIDVEN Microbiology Recent Results (from the past 240 hour(s))  Urine culture     Status: Abnormal   Collection Time: 06/29/17  4:32 PM  Result Value Ref Range Status   Specimen Description   Final    URINE, CATHETERIZED Performed at [facility], [address] [place], [place] [address]    Special Requests   Final    Normal Performed at [facility], [address] [place], [place] [address]    Culture >=100,000 COLONIES/mL PSEUDOMONAS AERUGINOSA (A)  Final   Report Status 07/02/2017 FINAL  Final   Organism ID, Bacteria PSEUDOMONAS AERUGINOSA (A)  Final      Susceptibility   Pseudomonas aeruginosa - MIC*    CEFTAZIDIME <=1 SENSITIVE Sensitive     CIPROFLOXACIN <=0.25 SENSITIVE Sensitive     GENTAMICIN <=1 SENSITIVE Sensitive     IMIPENEM 1 SENSITIVE Sensitive     PIP/TAZO <=4 SENSITIVE Sensitive     CEFEPIME <=1  SENSITIVE Sensitive     * >=100,000 COLONIES/mL PSEUDOMONAS AERUGINOSA  Blood Culture (routine x 2)     Status: None   Collection Time: 06/29/17  5:52 PM  Result Value Ref Range Status   Specimen Description RIGHT ANTECUBITAL DRAWN BY RN  Final   Special Requests   Final    BOTTLES DRAWN AEROBIC AND ANAEROBIC Blood Culture adequate volume   Culture   Final    NO GROWTH 5 DAYS Performed at [facility], [address], [place], [place] [address]    Report Status 07/04/2017 FINAL  Final  Blood Culture (routine x 2)     Status: None   Collection Time: 06/29/17  5:52 PM  Result Value Ref Range Status   Specimen Description BLOOD LEFT WRIST  Final   Special Requests   Final    BOTTLES DRAWN AEROBIC AND ANAEROBIC Blood Culture adequate volume   Culture   Final    NO GROWTH 5 DAYS Performed at [facility], [address] [place], [place] [address]    Report Status 07/04/2017 FINAL  Final     Time coordinating discharge: 35 minutes  SIGNED:   Rodena Goldmann, DO [facility] 07/05/2017, 11:41 AM Pager [phone]  If 7PM-7AM, please contact night-coverage [email or web address] Password [email or web address]

## 2017-07-05 NOTE — Care Management Note (Signed)
Case Management Note  Patient Details  Name: Josalynn Johndrow MRN: [id number] Date of Birth: 27-Nov-1945   Expected Discharge Date:  07/05/17               Expected Discharge Plan:  [place]  In-House Referral:  Hospice / Palliative Care  Discharge planning Services  CM Consult  Post Acute Care Choice:  Home Health Choice offered to:  [facility] POA / Guardian, Patient  DME Arranged:    DME Agency:     HH Arranged:  RN [place] Agency:  [place]  Status of Service:  Completed, signed off  If discussed at [place] of Stay Meetings, dates discussed:    Additional Comments: DC home today. Pt now wants hospice service. [name] has contacted [facility] and they are aware of DC plan. [facility] rep aware of DC plan. Pt will need EMS transport. CM to make arrangements.   Sherald Barge, RN 07/05/2017, 11:58 AM

## 2017-07-06 DIAGNOSIS — K5790 Diverticulosis of intestine, part unspecified, without perforation or abscess without bleeding: Secondary | ICD-10-CM | POA: Diagnosis not present

## 2017-07-06 DIAGNOSIS — E43 Unspecified severe protein-calorie malnutrition: Secondary | ICD-10-CM | POA: Diagnosis not present

## 2017-07-06 DIAGNOSIS — K6289 Other specified diseases of anus and rectum: Secondary | ICD-10-CM | POA: Diagnosis not present

## 2017-07-06 DIAGNOSIS — N183 Chronic kidney disease, stage 3 (moderate): Secondary | ICD-10-CM | POA: Diagnosis not present

## 2017-07-06 DIAGNOSIS — C9000 Multiple myeloma not having achieved remission: Secondary | ICD-10-CM | POA: Diagnosis not present

## 2017-07-07 DIAGNOSIS — K6289 Other specified diseases of anus and rectum: Secondary | ICD-10-CM | POA: Diagnosis not present

## 2017-07-07 DIAGNOSIS — K5790 Diverticulosis of intestine, part unspecified, without perforation or abscess without bleeding: Secondary | ICD-10-CM | POA: Diagnosis not present

## 2017-07-07 DIAGNOSIS — C9000 Multiple myeloma not having achieved remission: Secondary | ICD-10-CM | POA: Diagnosis not present

## 2017-07-07 DIAGNOSIS — N183 Chronic kidney disease, stage 3 (moderate): Secondary | ICD-10-CM | POA: Diagnosis not present

## 2017-07-07 DIAGNOSIS — E43 Unspecified severe protein-calorie malnutrition: Secondary | ICD-10-CM | POA: Diagnosis not present

## 2017-07-10 DIAGNOSIS — E43 Unspecified severe protein-calorie malnutrition: Secondary | ICD-10-CM | POA: Diagnosis not present

## 2017-07-10 DIAGNOSIS — N183 Chronic kidney disease, stage 3 (moderate): Secondary | ICD-10-CM | POA: Diagnosis not present

## 2017-07-10 DIAGNOSIS — C9000 Multiple myeloma not having achieved remission: Secondary | ICD-10-CM | POA: Diagnosis not present

## 2017-07-10 DIAGNOSIS — K5790 Diverticulosis of intestine, part unspecified, without perforation or abscess without bleeding: Secondary | ICD-10-CM | POA: Diagnosis not present

## 2017-07-10 DIAGNOSIS — K6289 Other specified diseases of anus and rectum: Secondary | ICD-10-CM | POA: Diagnosis not present

## 2017-07-11 DIAGNOSIS — N183 Chronic kidney disease, stage 3 (moderate): Secondary | ICD-10-CM | POA: Diagnosis not present

## 2017-07-11 DIAGNOSIS — E43 Unspecified severe protein-calorie malnutrition: Secondary | ICD-10-CM | POA: Diagnosis not present

## 2017-07-11 DIAGNOSIS — K6289 Other specified diseases of anus and rectum: Secondary | ICD-10-CM | POA: Diagnosis not present

## 2017-07-11 DIAGNOSIS — K5790 Diverticulosis of intestine, part unspecified, without perforation or abscess without bleeding: Secondary | ICD-10-CM | POA: Diagnosis not present

## 2017-07-11 DIAGNOSIS — C9000 Multiple myeloma not having achieved remission: Secondary | ICD-10-CM | POA: Diagnosis not present

## 2017-07-13 DIAGNOSIS — K6289 Other specified diseases of anus and rectum: Secondary | ICD-10-CM | POA: Diagnosis not present

## 2017-07-13 DIAGNOSIS — E43 Unspecified severe protein-calorie malnutrition: Secondary | ICD-10-CM | POA: Diagnosis not present

## 2017-07-13 DIAGNOSIS — C9000 Multiple myeloma not having achieved remission: Secondary | ICD-10-CM | POA: Diagnosis not present

## 2017-07-13 DIAGNOSIS — N183 Chronic kidney disease, stage 3 (moderate): Secondary | ICD-10-CM | POA: Diagnosis not present

## 2017-07-13 DIAGNOSIS — K5790 Diverticulosis of intestine, part unspecified, without perforation or abscess without bleeding: Secondary | ICD-10-CM | POA: Diagnosis not present

## 2017-07-14 ENCOUNTER — Inpatient Hospital Stay (HOSPITAL_COMMUNITY)
Admission: EM | Admit: 2017-07-14 | Discharge: 2017-07-18 | DRG: 640 | Disposition: A | Discharge status: Hospice | Payer: Medicare Other | Attending: Internal Medicine | Admitting: Internal Medicine

## 2017-07-14 ENCOUNTER — Other Ambulatory Visit: Payer: Self-pay

## 2017-07-14 ENCOUNTER — Encounter (HOSPITAL_COMMUNITY): Payer: Self-pay | Admitting: *Deleted

## 2017-07-14 DIAGNOSIS — C9000 Multiple myeloma not having achieved remission: Secondary | ICD-10-CM | POA: Diagnosis not present

## 2017-07-14 DIAGNOSIS — E860 Dehydration: Secondary | ICD-10-CM | POA: Diagnosis present

## 2017-07-14 DIAGNOSIS — I5189 Other ill-defined heart diseases: Secondary | ICD-10-CM | POA: Diagnosis present

## 2017-07-14 DIAGNOSIS — I519 Heart disease, unspecified: Secondary | ICD-10-CM | POA: Diagnosis not present

## 2017-07-14 DIAGNOSIS — Z79899 Other long term (current) drug therapy: Secondary | ICD-10-CM

## 2017-07-14 DIAGNOSIS — E43 Unspecified severe protein-calorie malnutrition: Secondary | ICD-10-CM | POA: Diagnosis present

## 2017-07-14 DIAGNOSIS — Z66 Do not resuscitate: Secondary | ICD-10-CM | POA: Diagnosis present

## 2017-07-14 DIAGNOSIS — I509 Heart failure, unspecified: Secondary | ICD-10-CM | POA: Diagnosis present

## 2017-07-14 DIAGNOSIS — Z7401 Bed confinement status: Secondary | ICD-10-CM | POA: Diagnosis not present

## 2017-07-14 DIAGNOSIS — Z91048 Other nonmedicinal substance allergy status: Secondary | ICD-10-CM

## 2017-07-14 DIAGNOSIS — Z8249 Family history of ischemic heart disease and other diseases of the circulatory system: Secondary | ICD-10-CM

## 2017-07-14 DIAGNOSIS — I129 Hypertensive chronic kidney disease with stage 1 through stage 4 chronic kidney disease, or unspecified chronic kidney disease: Secondary | ICD-10-CM | POA: Diagnosis present

## 2017-07-14 DIAGNOSIS — D61818 Other pancytopenia: Secondary | ICD-10-CM | POA: Diagnosis not present

## 2017-07-14 DIAGNOSIS — I130 Hypertensive heart and chronic kidney disease with heart failure and stage 1 through stage 4 chronic kidney disease, or unspecified chronic kidney disease: Secondary | ICD-10-CM | POA: Diagnosis present

## 2017-07-14 DIAGNOSIS — Z6821 Body mass index (BMI) 21.0-21.9, adult: Secondary | ICD-10-CM

## 2017-07-14 DIAGNOSIS — Z515 Encounter for palliative care: Secondary | ICD-10-CM | POA: Diagnosis not present

## 2017-07-14 DIAGNOSIS — N183 Chronic kidney disease, stage 3 unspecified: Secondary | ICD-10-CM | POA: Diagnosis present

## 2017-07-14 DIAGNOSIS — E876 Hypokalemia: Secondary | ICD-10-CM | POA: Diagnosis not present

## 2017-07-14 DIAGNOSIS — R279 Unspecified lack of coordination: Secondary | ICD-10-CM | POA: Diagnosis not present

## 2017-07-14 DIAGNOSIS — R42 Dizziness and giddiness: Secondary | ICD-10-CM | POA: Diagnosis not present

## 2017-07-14 DIAGNOSIS — Z888 Allergy status to other drugs, medicaments and biological substances status: Secondary | ICD-10-CM | POA: Diagnosis not present

## 2017-07-14 DIAGNOSIS — K6289 Other specified diseases of anus and rectum: Secondary | ICD-10-CM | POA: Diagnosis not present

## 2017-07-14 DIAGNOSIS — Z7189 Other specified counseling: Secondary | ICD-10-CM | POA: Diagnosis not present

## 2017-07-14 DIAGNOSIS — R5383 Other fatigue: Secondary | ICD-10-CM | POA: Diagnosis not present

## 2017-07-14 DIAGNOSIS — K5790 Diverticulosis of intestine, part unspecified, without perforation or abscess without bleeding: Secondary | ICD-10-CM | POA: Diagnosis not present

## 2017-07-14 LAB — CBC WITH DIFFERENTIAL/PLATELET
Basophils Absolute: 0 10*3/uL (ref 0.0–0.1)
Basophils Relative: 1 %
EOS PCT: 1 %
Eosinophils Absolute: 0 10*3/uL (ref 0.0–0.7)
HEMATOCRIT: 29 % — AB (ref 36.0–46.0)
Hemoglobin: 9.6 g/dL — ABNORMAL LOW (ref 12.0–15.0)
LYMPHS PCT: 19 %
Lymphs Abs: 0.7 10*3/uL (ref 0.7–4.0)
MCH: 30.4 pg (ref 26.0–34.0)
MCHC: 33.1 g/dL (ref 30.0–36.0)
MCV: 91.8 fL (ref 78.0–100.0)
MONO ABS: 0.5 10*3/uL (ref 0.1–1.0)
MONOS PCT: 13 %
NEUTROS ABS: 2.6 10*3/uL (ref 1.7–7.7)
Neutrophils Relative %: 66 %
Platelets: 133 10*3/uL — ABNORMAL LOW (ref 150–400)
RBC: 3.16 MIL/uL — ABNORMAL LOW (ref 3.87–5.11)
RDW: 16.5 % — AB (ref 11.5–15.5)
WBC: 3.8 10*3/uL — ABNORMAL LOW (ref 4.0–10.5)

## 2017-07-14 LAB — CBC
HCT: 26.3 % — ABNORMAL LOW (ref 36.0–46.0)
HEMOGLOBIN: 8.6 g/dL — AB (ref 12.0–15.0)
MCH: 30 pg (ref 26.0–34.0)
MCHC: 32.7 g/dL (ref 30.0–36.0)
MCV: 91.6 fL (ref 78.0–100.0)
PLATELETS: 129 10*3/uL — AB (ref 150–400)
RBC: 2.87 MIL/uL — ABNORMAL LOW (ref 3.87–5.11)
RDW: 16.4 % — ABNORMAL HIGH (ref 11.5–15.5)
WBC: 3.8 10*3/uL — ABNORMAL LOW (ref 4.0–10.5)

## 2017-07-14 LAB — COMPREHENSIVE METABOLIC PANEL
ALBUMIN: 1.7 g/dL — AB (ref 3.5–5.0)
ALT: 14 U/L (ref 14–54)
ANION GAP: 10 (ref 5–15)
AST: 47 U/L — AB (ref 15–41)
Alkaline Phosphatase: 80 U/L (ref 38–126)
BUN: 35 mg/dL — AB (ref 6–20)
CO2: 29 mmol/L (ref 22–32)
Calcium: >15 mg/dL (ref 8.9–10.3)
Chloride: 99 mmol/L — ABNORMAL LOW (ref 101–111)
Creatinine, Ser: 1.36 mg/dL — ABNORMAL HIGH (ref 0.44–1.00)
GFR calc Af Amer: 44 mL/min — ABNORMAL LOW (ref >60)
GFR calc non Af Amer: 38 mL/min — ABNORMAL LOW (ref >60)
GLUCOSE: 130 mg/dL — AB (ref 65–99)
Potassium: 2.9 mmol/L — ABNORMAL LOW (ref 3.5–5.1)
SODIUM: 138 mmol/L (ref 135–145)
Total Bilirubin: 0.6 mg/dL (ref 0.3–1.2)
Total Protein: 7.2 g/dL (ref 6.5–8.1)

## 2017-07-14 LAB — CREATININE, SERUM
CREATININE: 1.3 mg/dL — AB (ref 0.44–1.00)
GFR calc Af Amer: 46 mL/min — ABNORMAL LOW (ref >60)
GFR, EST NON AFRICAN AMERICAN: 40 mL/min — AB (ref >60)

## 2017-07-14 LAB — PHOSPHORUS
PHOSPHORUS: 3.6 mg/dL (ref 2.5–4.6)
Phosphorus: 3.8 mg/dL (ref 2.5–4.6)

## 2017-07-14 LAB — MAGNESIUM
Magnesium: 2.2 mg/dL (ref 1.7–2.4)
Magnesium: 2.1 mg/dL (ref 1.7–2.4)

## 2017-07-14 MED ORDER — SODIUM CHLORIDE 0.9 % IV BOLUS
500.0000 mL | Freq: Once | INTRAVENOUS | Status: AC
  Administered 2017-07-14: 500 mL via INTRAVENOUS

## 2017-07-14 MED ORDER — POTASSIUM CHLORIDE CRYS ER 20 MEQ PO TBCR
40.0000 meq | EXTENDED_RELEASE_TABLET | Freq: Every day | ORAL | Status: DC
  Administered 2017-07-15 – 2017-07-18 (×4): 40 meq via ORAL
  Filled 2017-07-14 (×4): qty 2

## 2017-07-14 MED ORDER — PAMIDRONATE DISODIUM 30 MG IV SOLR
INTRAVENOUS | Status: AC
  Filled 2017-07-14: qty 60

## 2017-07-14 MED ORDER — SODIUM CHLORIDE 0.9 % IV SOLN
60.0000 mg | Freq: Once | INTRAVENOUS | Status: AC
  Administered 2017-07-14: 60 mg via INTRAVENOUS
  Filled 2017-07-14: qty 20

## 2017-07-14 MED ORDER — SODIUM CHLORIDE 0.9 % IV BOLUS
1000.0000 mL | Freq: Once | INTRAVENOUS | Status: AC
  Administered 2017-07-14: 1000 mL via INTRAVENOUS

## 2017-07-14 MED ORDER — POTASSIUM CHLORIDE 10 MEQ/100ML IV SOLN
10.0000 meq | Freq: Once | INTRAVENOUS | Status: AC
  Administered 2017-07-14: 10 meq via INTRAVENOUS
  Filled 2017-07-14: qty 100

## 2017-07-14 MED ORDER — MAGNESIUM OXIDE 400 (241.3 MG) MG PO TABS
400.0000 mg | ORAL_TABLET | Freq: Every day | ORAL | Status: DC
  Administered 2017-07-15 – 2017-07-18 (×4): 400 mg via ORAL
  Filled 2017-07-14 (×4): qty 1

## 2017-07-14 MED ORDER — ONDANSETRON HCL 4 MG/2ML IJ SOLN: 4.0000 mg | Freq: Four times a day (QID) | INTRAMUSCULAR | Status: DC | PRN

## 2017-07-14 MED ORDER — CALCITONIN (SALMON) 200 UNIT/ACT NA SOLN
1.0000 | Freq: Every day | NASAL | Status: DC
  Administered 2017-07-15: 1 via NASAL
  Filled 2017-07-14: qty 3.7

## 2017-07-14 MED ORDER — ACETAMINOPHEN 325 MG PO TABS: 650.0000 mg | ORAL_TABLET | Freq: Four times a day (QID) | ORAL | Status: DC | PRN

## 2017-07-14 MED ORDER — POTASSIUM CHLORIDE CRYS ER 20 MEQ PO TBCR
40.0000 meq | EXTENDED_RELEASE_TABLET | Freq: Once | ORAL | Status: AC
  Administered 2017-07-14: 40 meq via ORAL
  Filled 2017-07-14: qty 2

## 2017-07-14 MED ORDER — ONDANSETRON HCL 4 MG PO TABS: 4.0000 mg | ORAL_TABLET | Freq: Four times a day (QID) | ORAL | Status: DC | PRN

## 2017-07-14 MED ORDER — ENSURE ENLIVE PO LIQD
237.0000 mL | Freq: Three times a day (TID) | ORAL | Status: DC
  Administered 2017-07-14 – 2017-07-18 (×9): 237 mL via ORAL
  Filled 2017-07-14 (×9): qty 237

## 2017-07-14 MED ORDER — SODIUM CHLORIDE 0.9 % IV SOLN
INTRAVENOUS | Status: DC
  Administered 2017-07-14 – 2017-07-18 (×8): via INTRAVENOUS

## 2017-07-14 MED ORDER — HEPARIN SODIUM (PORCINE) 5000 UNIT/ML IJ SOLN
5000.0000 [IU] | Freq: Three times a day (TID) | INTRAMUSCULAR | Status: DC
  Administered 2017-07-14 – 2017-07-17 (×8): 5000 [IU] via SUBCUTANEOUS
  Filled 2017-07-14 (×10): qty 1

## 2017-07-14 NOTE — ED Triage Notes (Signed)
Pt has had issues with elevated calcium levels.  Was unable to contact primary md and hospice was unable to send nurse for blood draw
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
## 2017-07-14 NOTE — ED Notes (Signed)
CRITICAL VALUE ALERT  Critical Value:  Calcium >15.0  Date & Time Notied:  1652, 07/14/17  Provider Notified: Dr. Regenia Skeeter  Orders Received/Actions taken: no new orders at this time

## 2017-07-15 LAB — BASIC METABOLIC PANEL
ANION GAP: 8 (ref 5–15)
BUN: 32 mg/dL — AB (ref 6–20)
CO2: 27 mmol/L (ref 22–32)
Calcium: 16.7 mg/dL (ref 8.9–10.3)
Chloride: 104 mmol/L (ref 101–111)
Creatinine, Ser: 1.27 mg/dL — ABNORMAL HIGH (ref 0.44–1.00)
GFR calc Af Amer: 48 mL/min — ABNORMAL LOW (ref >60)
GFR, EST NON AFRICAN AMERICAN: 41 mL/min — AB (ref >60)
GLUCOSE: 91 mg/dL (ref 65–99)
POTASSIUM: 3.1 mmol/L — AB (ref 3.5–5.1)
Sodium: 139 mmol/L (ref 135–145)

## 2017-07-15 LAB — CBC
HCT: 25.3 % — ABNORMAL LOW (ref 36.0–46.0)
HEMOGLOBIN: 8.4 g/dL — AB (ref 12.0–15.0)
MCH: 30.7 pg (ref 26.0–34.0)
MCHC: 33.2 g/dL (ref 30.0–36.0)
MCV: 92.3 fL (ref 78.0–100.0)
Platelets: 123 10*3/uL — ABNORMAL LOW (ref 150–400)
RBC: 2.74 MIL/uL — ABNORMAL LOW (ref 3.87–5.11)
RDW: 16.7 % — AB (ref 11.5–15.5)
WBC: 3.4 10*3/uL — ABNORMAL LOW (ref 4.0–10.5)

## 2017-07-15 MED ORDER — CALCITONIN (SALMON) 200 UNIT/ACT NA SOLN
1.0000 | Freq: Two times a day (BID) | NASAL | Status: DC
  Administered 2017-07-15 – 2017-07-18 (×6): 1 via NASAL
  Filled 2017-07-15: qty 3.7

## 2017-07-15 MED ORDER — PAMIDRONATE DISODIUM 30 MG/10ML IV SOLN
30.0000 mg | Freq: Once | INTRAVENOUS | Status: AC
  Administered 2017-07-15: 30 mg via INTRAVENOUS
  Filled 2017-07-15: qty 10

## 2017-07-15 MED ORDER — PAMIDRONATE DISODIUM 30 MG IV SOLR
INTRAVENOUS | Status: AC
  Filled 2017-07-15: qty 30

## 2017-07-15 NOTE — Progress Notes (Signed)
Nutrition Brief Note  Patient identified on the Malnutrition Screening Tool (MST) Report  Per review of chart, patient is under hospice care with severe hypercalcemia that has been refractory to outpatient medications.   Patient has chronic anorexia, likely related to her malignancy.   Given hospice care, would not recommend aggressive nutrition interventions. Would continue to OFFER ensure to patient TID and allow regular diet.   No nutrition interventions warranted at this time. If nutrition issues arise, please consult RD.   Burtis Junes RD, LDN, CNSC Clinical Nutrition Available Tues-Sat via Pager: [phone] 07/15/2017 8:01 AM

## 2017-07-15 NOTE — Progress Notes (Signed)
[facility] PROGRESS NOTE  Deanna Holloway HER:[id number] DOB: 14-Aug-1945 DOA: 07/14/2017 PCP: Neale Burly, MD  Interim summary and HPI 72 y/o with pmh significant for severe protein calorie malnutrition, CKD stage 3, MM not in remission, malignant hypercalcemia and multiple admissions with dehydration and elevated calcium level; who came to Ed secondary to increase fatigue, somnolence and anorexia. Patient's denies CP, SOB, nausea, vomiting, HA's.  Assessment/Plan: 1-Malignant Hypercalcemia -patient calcium is higher today -continue hydration -will complete a total of 90 mg of pamidronate; patient received 65m on admission and would give 317mtoday. -continue calcitonin; dose adjusted to BID -encourage to increase PO intake/hydration  -follow palliative care consultation    2-[facility]-patient now under hosp[facility]; but still demanding treatment for her hypercalcemia -follow palliative care consult -respect DNR wishes -continue declining treatment for MM and not longer a candidate for oncology therapy.   3-Hypokalemia -in the setting of dehydration and poor intake -will continue repletion/daily supplementation   4-CKD (chronic kidney disease) stage 3, GFR 30-59 ml/min (HCC) -Cr 1.27 after IVF's -will follow trend intermittently   5-Pancytopenia ([place]-associated with underlying malignancy  -will follow trend intermittently -not need for transfusion currently   6-Severe protein-calorie malnutrition ([place]-continue to have anorexia -will continue regular diet and TID ensure   7-Grade II diastolic dysfunction -remains compensated -will continue judicious IVF's -follow volume status with daily weights and strict intake/output   6-Dehydration -better with IVF resuscitation -encourage to improve PO intake -continue IVF's  Code Status: DNR Family Communication: No family at bedside Disposition Plan: Continue IV  fluids, complete dose of pamidronate and a dose of calcitonin level.  Follow calcium trend.  Patient is better hydrated currently.  Still no eating or drinking much.   Consultants:  Palliative care  Procedures:  None  Antibiotics:  None  HPI/Subjective: No fever, no nausea, no vomiting, no pain.  Feeling weak, without appetite.  Patient denies shortness of breath.  Objective: Vitals:   07/14/17 2023 07/15/17 0551  BP: (!) 114/55 108/62  Pulse: (!) 101 100  Resp: 17 19  Temp: 98.1 F (36.7 C) 98.5 F (36.9 C)  SpO2: 92% 95%    Intake/Output Summary (Last 24 hours) at 07/15/2017 1454 Last data filed at 07/15/2017 0900 Gross per 24 hour  Intake 1711.67 ml  Output 1200 ml  Net 511.67 ml   Filed Weights   07/14/17 1527 07/14/17 2023 07/15/17 0551  Weight: 50.3 kg (111 lb) 50.6 kg (111 lb 8.8 oz) 53 kg (116 lb 13.5 oz)    Exam:   General: Afebrile, frail, generally weak and chronically ill in appearance.  Patient reports not having much of an appetite but denies any pain, shortness of breath or acute distress.  Cardiovascular: No JVD, S1 and S2, no rubs, no gallops  Respiratory: Good air movement bilaterally, no crackles, no wheezing, no rhonchi  Abdomen: Soft, nontender on palpation, nondistended, positive bowel sounds  Musculoskeletal: No edema, no cyanosis, no clubbing.  Data Reviewed: Basic Metabolic Panel: Recent Labs  Lab 07/14/17 1552 07/14/17 2110 07/15/17 0607  NA 138  --  139  K 2.9*  --  3.1*  CL 99*  --  104  CO2 29  --  27  GLUCOSE 130*  --  91  BUN 35*  --  32*  CREATININE 1.36* 1.30* 1.27*  CALCIUM >15.0*  --  16.7*  MG 2.2 2.1  --   PHOS 3.8 3.6  --  Liver Function Tests: Recent Labs  Lab 07/14/17 1552  AST 47*  ALT 14  ALKPHOS 80  BILITOT 0.6  PROT 7.2  ALBUMIN 1.7*   CBC: Recent Labs  Lab 07/14/17 1552 07/14/17 2110 07/15/17 0607  WBC 3.8* 3.8* 3.4*  NEUTROABS 2.6  --   --   HGB 9.6* 8.6* 8.4*  HCT 29.0* 26.3*  25.3*  MCV 91.8 91.6 92.3  PLT 133* 129* 123*    Studies: No results found.  Scheduled Meds: . calcitonin (salmon)  1 spray Alternating Nares Daily  . feeding supplement (ENSURE ENLIVE)  237 mL Oral TID BM  . heparin  5,000 Units Subcutaneous Q8H  . magnesium oxide  400 mg Oral Daily  . potassium chloride  40 mEq Oral Daily   Continuous Infusions: . sodium chloride 100 mL/hr at 07/15/17 7672    Time spent: 30 minutes    [facility] Pager [phone]. If 7PM-7AM, please contact night-coverage at [email or web address], password [facility] 07/15/2017, 2:54 PM  LOS: 1 day

## 2017-07-16 LAB — BASIC METABOLIC PANEL
ANION GAP: 8 (ref 5–15)
BUN: 31 mg/dL — ABNORMAL HIGH (ref 6–20)
CO2: 27 mmol/L (ref 22–32)
CREATININE: 1.19 mg/dL — AB (ref 0.44–1.00)
Calcium: >15 mg/dL (ref 8.9–10.3)
Chloride: 102 mmol/L (ref 101–111)
GFR calc Af Amer: 52 mL/min — ABNORMAL LOW (ref >60)
GFR calc non Af Amer: 44 mL/min — ABNORMAL LOW (ref >60)
GLUCOSE: 157 mg/dL — AB (ref 65–99)
Potassium: 3.1 mmol/L — ABNORMAL LOW (ref 3.5–5.1)
Sodium: 137 mmol/L (ref 135–145)

## 2017-07-16 NOTE — Progress Notes (Signed)
[facility] PROGRESS NOTE  Legacy Carrender [id number]:[id number] DOB: 1945/10/02 DOA: 07/14/2017 PCP: Neale Burly, MD  Interim summary and HPI 72 y/o with pmh significant for severe protein calorie malnutrition, CKD stage 3, MM not in remission, malignant hypercalcemia and multiple admissions with dehydration and elevated calcium level; who came to Ed secondary to increase fatigue, somnolence and anorexia. Patient's denies CP, SOB, nausea, vomiting, HA's.  Assessment/Plan: 1-Malignant Hypercalcemia -patient calcium is lower today (but still elevated) -continue hydration -Calcium level when down some; even still elevated. -continue calcitonin BID -we have to wait 6 days for another dose of pamidronate. -encourage to increase PO intake/hydration  -follow palliative care consultation    2-Multiple myeloma not having achieved remission [facility]) -patient now under hospice; but still demanding treatment for her hypercalcemia -follow palliative care consult -respect DNR wishes -continue declining treatment for MM and not longer a candidate for oncology therapy.   3-Hypokalemia -in the setting of dehydration and poor intake -will continue repletion/daily supplementation   4-CKD (chronic kidney disease) stage 3, GFR 30-59 ml/min (HCC) -Cr 1.19 after IVF's -will follow trend intermittently   5-Pancytopenia ([place]) -associated with underlying malignancy  -will follow trend intermittently -not need for transfusion currently   6-Severe protein-calorie malnutrition ([place]) -continue to have anorexia -will continue regular diet and TID ensure   7-Grade II diastolic dysfunction -remains compensated -will continue judicious IVF's -follow volume status with daily weights and strict intake/output   6-Dehydration -better with IVF resuscitation -encourage to improve PO intake -continue IVF's  Code Status: DNR Family Communication: No family at bedside Disposition Plan: Continue IV  fluids, follow calcium trend, continue calcitonin. Palliative care re-consulted. Follow calcium trend.  Patient is better hydrated.  Still no eating or drinking much.   Consultants:  Palliative care  Procedures:  None  Antibiotics:  None  HPI/Subjective: No fever, no nausea, no vomiting, no pain.  Patient without any appetite, is feeling weak and tired.  Reports some intermittent pain in her bones.   Objective: Vitals:   07/16/17 0444 07/16/17 1414  BP: 100/70 (!) 102/56  Pulse: 99 (!) 104  Resp: 16 18  Temp: 98.2 F (36.8 C) 98.1 F (36.7 C)  SpO2: 95% 99%    Intake/Output Summary (Last 24 hours) at 07/16/2017 1440 Last data filed at 07/16/2017 1315 Gross per 24 hour  Intake 3630 ml  Output 3150 ml  Net 480 ml   Filed Weights   07/14/17 2023 07/15/17 0551 07/16/17 0500  Weight: 50.6 kg (111 lb 8.8 oz) 53 kg (116 lb 13.5 oz) 53.2 kg (117 lb 4.6 oz)    Exam:   General: No fever, frail, chronically ill in appearance and generally weak.  Patient reports no pain, no shortness of breath, feeling tired and with ongoing anorexia.    Cardiovascular: No JVD, S1 and S2, no rubs, no gallops.    Respiratory: Good air movement bilaterally, no crackles, no wheezing, no rhonchi  Abdomen: Soft, nontender, nondistended, positive bowel sounds.   Musculoskeletal: No edema, no cyanosis, no clubbing.    Data Reviewed: Basic Metabolic Panel: Recent Labs  Lab 07/14/17 1552 07/14/17 2110 07/15/17 0607 07/16/17 0611  NA 138  --  139 137  K 2.9*  --  3.1* 3.1*  CL 99*  --  104 102  CO2 29  --  27 27  GLUCOSE 130*  --  91 157*  BUN 35*  --  32* 31*  CREATININE 1.36* 1.30* 1.27* 1.19*  CALCIUM >15.0*  --  16.7* >15.0*  MG 2.2 2.1  --   --   PHOS 3.8 3.6  --   --    Liver Function Tests: Recent Labs  Lab 07/14/17 1552  AST 47*  ALT 14  ALKPHOS 80  BILITOT 0.6  PROT 7.2  ALBUMIN 1.7*   CBC: Recent Labs  Lab 07/14/17 1552 07/14/17 2110 07/15/17 0607  WBC 3.8*  3.8* 3.4*  NEUTROABS 2.6  --   --   HGB 9.6* 8.6* 8.4*  HCT 29.0* 26.3* 25.3*  MCV 91.8 91.6 92.3  PLT 133* 129* 123*    Studies: No results found.  Scheduled Meds: . calcitonin (salmon)  1 spray Alternating Nares BID  . feeding supplement (ENSURE ENLIVE)  237 mL Oral TID BM  . heparin  5,000 Units Subcutaneous Q8H  . magnesium oxide  400 mg Oral Daily  . potassium chloride  40 mEq Oral Daily   Continuous Infusions: . sodium chloride 100 mL/hr at 07/16/17 1414    Time spent: 30 minutes    [place] [facility] Pager [phone]. If 7PM-7AM, please contact night-coverage at [email or web address], password [facility] 07/16/2017, 2:40 PM  LOS: 2 days

## 2017-07-17 DIAGNOSIS — Z515 Encounter for palliative care: Secondary | ICD-10-CM

## 2017-07-17 DIAGNOSIS — Z7189 Other specified counseling: Secondary | ICD-10-CM

## 2017-07-17 LAB — BASIC METABOLIC PANEL
ANION GAP: 10 (ref 5–15)
BUN: 33 mg/dL — ABNORMAL HIGH (ref 6–20)
CHLORIDE: 103 mmol/L (ref 101–111)
CO2: 25 mmol/L (ref 22–32)
Calcium: 14.8 mg/dL (ref 8.9–10.3)
Creatinine, Ser: 1.18 mg/dL — ABNORMAL HIGH (ref 0.44–1.00)
GFR calc Af Amer: 52 mL/min — ABNORMAL LOW (ref >60)
GFR, EST NON AFRICAN AMERICAN: 45 mL/min — AB (ref >60)
Glucose, Bld: 84 mg/dL (ref 65–99)
POTASSIUM: 3.9 mmol/L (ref 3.5–5.1)
SODIUM: 138 mmol/L (ref 135–145)

## 2017-07-17 NOTE — Progress Notes (Signed)
[facility] PROGRESS NOTE  Gwenetta Devos UKG:[id number] DOB: 06-03-1945 DOA: 07/14/2017 PCP: Neale Burly, MD  Interim summary and HPI 72 y/o with pmh significant for severe protein calorie malnutrition, CKD stage 3, MM not in remission, malignant hypercalcemia and multiple admissions with dehydration and elevated calcium level; who came to Ed secondary to increase fatigue, somnolence and anorexia. Patient's denies CP, SOB, nausea, vomiting, HA's.  Assessment/Plan: 1-Malignant Hypercalcemia -patient calcium is lower today (but still elevated) -continue hydration -Calcium level continue slowly trending down; 14.8 today -continue calcitonin BID -we have to wait 4-5 days for another dose of pamidronate, if further treatment for hypercalcemia attempted.  -patient once again encourage to increase PO intake/hydration  -follow palliative care consultation     2-Multiple myeloma not having achieved remission [facility]) -patient now under hospice; but still demanding treatment for her hypercalcemia -follow palliative care consult -respect DNR wishes -continue declining treatment for MM and not longer a candidate for oncology therapy.   3-Hypokalemia -in the setting of dehydration and poor intake -will continue repletion/daily supplementation  -K 3.9 today  4-CKD (chronic kidney disease) stage 3, GFR 30-59 ml/min (HCC) -Cr 1.18 after IVF's -will follow trend intermittently   5-Pancytopenia ([place]) -associated with underlying malignancy  -will follow trend intermittently -not need for transfusion currently   6-Severe protein-calorie malnutrition ([place]) -continue to have anorexia -will continue regular diet and TID ensure   7-Grade II diastolic dysfunction -remains compensated -will continue judicious IVF's -follow volume status with daily weights and strict intake/output   6-Dehydration -better with IVF resuscitation -encourage to improve PO intake -continue  IVF's  Code Status: DNR Family Communication: No family at bedside Disposition Plan: Continue IV fluids, follow calcium trend, continue calcitonin. Palliative care re-consulted. Follow calcium trend.  She with improvement in hydration, stable vital signs and expressed to be drinking more. Still with anorexia.    Consultants:  Palliative care  Procedures:  None  Antibiotics:  None  HPI/Subjective: Patient denies chest pain, shortness of breath, nausea or vomiting.  Continued to have poor appetite, but expressed that she is drinking a lot of water.  She is a week and feels tired.  Objective: Vitals:   07/17/17 0630 07/17/17 1318  BP: (!) 102/55 106/67  Pulse: 95 94  Resp: 17 16  Temp: 98.1 F (36.7 C) 98 F (36.7 C)  SpO2: 96% 98%    Intake/Output Summary (Last 24 hours) at 07/17/2017 1358 Last data filed at 07/17/2017 1324 Gross per 24 hour  Intake 3240 ml  Output 2900 ml  Net 340 ml   Filed Weights   07/15/17 0551 07/16/17 0500 07/17/17 0500  Weight: 53 kg (116 lb 13.5 oz) 53.2 kg (117 lb 4.6 oz) 54.2 kg (119 lb 7.8 oz)    Exam:   General: No fever, frail, chronically ill in appearance and complaining of being generally weak.  Patient continued to have decreased appetite, but expressed that she is drinking more.  Patient denies shortness of breath, chest pain, abdominal pain, nausea, vomiting or any other complaints.  Cardiovascular: No JVD, S1 and S2, no rubs, no gallops, no murmurs.   Respiratory: Good air movement bilaterally, no crackles, no wheezing, no rhonchi.  Abdomen: Soft, nontender, nondistended, positive bowel sounds.   Musculoskeletal: No edema, no cyanosis or clubbing.    Data Reviewed: Basic Metabolic Panel: Recent Labs  Lab 07/14/17 1552 07/14/17 2110 07/15/17 0607 07/16/17 0611 07/17/17 0618  NA 138  --  139 137 138  K 2.9*  --  3.1* 3.1* 3.9  CL 99*  --  104 102 103  CO2 29  --  '27 27 25  ' GLUCOSE 130*  --  91 157* 84  BUN 35*   --  32* 31* 33*  CREATININE 1.36* 1.30* 1.27* 1.19* 1.18*  CALCIUM >15.0*  --  16.7* >15.0* 14.8*  MG 2.2 2.1  --   --   --   PHOS 3.8 3.6  --   --   --    Liver Function Tests: Recent Labs  Lab 07/14/17 1552  AST 47*  ALT 14  ALKPHOS 80  BILITOT 0.6  PROT 7.2  ALBUMIN 1.7*   CBC: Recent Labs  Lab 07/14/17 1552 07/14/17 2110 07/15/17 0607  WBC 3.8* 3.8* 3.4*  NEUTROABS 2.6  --   --   HGB 9.6* 8.6* 8.4*  HCT 29.0* 26.3* 25.3*  MCV 91.8 91.6 92.3  PLT 133* 129* 123*    Studies: No results found.  Scheduled Meds: . calcitonin (salmon)  1 spray Alternating Nares BID  . feeding supplement (ENSURE ENLIVE)  237 mL Oral TID BM  . heparin  5,000 Units Subcutaneous Q8H  . magnesium oxide  400 mg Oral Daily  . potassium chloride  40 mEq Oral Daily   Continuous Infusions: . sodium chloride 100 mL/hr at 07/17/17 2800    Time spent: 25 minutes    [place] [facility] Pager [phone]. If 7PM-7AM, please contact night-coverage at [email or web address], password [facility] 07/17/2017, 1:58 PM  LOS: 3 days

## 2017-07-17 NOTE — Progress Notes (Signed)
CRITICAL VALUE ALERT  Critical Value:  Calcium 14.8  Date & Time Notied:  07/17/17 @ 07:10  Provider Notified: [place]  Orders Received/Actions taken: No new orders at this time

## 2017-07-17 NOTE — Clinical Social Work Note (Signed)
Patient Information   Patient Name Deanna Holloway, Deanna Holloway ([id number]) Sex Female DOB 1945/08/14 SSN [id number]  Room Bed  A303 A303-01  Patient Demographics   Address [place] [address] Phone [phone] [facility]) [phone] (Mobile) E-mail Address [email or web address]  Patient Ethnicity & Race   Ethnic Group Patient Race  Not Hispanic or Latino White or Caucasian  Emergency Contact(s)   Name Relation Home Work Mobile  Dowling,Eric Relative [phone]    Documents on File    Status Date Received Description  Documents for the Patient  [place] Not Received    [place] E-Signature HIPAA Notice of Privacy Signed 40/97/35   Driver's License Not Received  2018  Insurance Card Received 05/12/16 MEDICARE  Advance Directives/Living Will/HCPOA/POA Not Received    Other Photo ID Not Received    Release of Information  05/13/16   HIM ROI Authorization  05/28/16 ON CALL  Insurance Card     HIM ROI Authorization  06/21/16 HASANAJ,XAJE A MD  HIM ROI Authorization (Expired) 06/23/16 Xaje A. Hasanaj, MD  AMB Outside Consult Note  05/30/16 [id number]  AMB Correspondence  06/06/16 NOTES  AMB Outside Consult Note  05/30/16 SKIVER DO, B.  AMB Correspondence  05/27/16   [place] E-Signature HIPAA Notice of Privacy Signed 09/15/16   Release of Information   PBCN  Release of Information   [place]  AMB Provider Completed Forms  09/29/16 PATIENT ASSISTANCE PROGRAM APPROVAL [place] Card Received 03/15/17 MEDICARE A/B  AMB Correspondence Received 01/27/17 [facility]  Advance Directives/Living Will/HCPOA/POA Received 07/06/17   Patient Photo   Photo of Patient  HIM ROI Authorization (Deleted) 06/20/16 Record Request  Documents for the Encounter  AOB (Assignment of Insurance Benefits) Not Received    E-signature AOB Signed 07/14/17   MEDICARE RIGHTS Not Received    E-signature  Medicare Rights Signed 07/14/17   ED Patient Billing Extract   ED PB Billing Extract  Admission Information   Attending Provider Admitting Provider Admission Type Admission Date/Time  Barton Dubois, MD Barton Dubois, MD Emergency 07/14/17 1536  Discharge Date Hospital Service Auth/Cert Status Service Area   Internal Medicine Incomplete [facility]  Unit Room/Bed Admission Status   AP-DEPT 300 A303/A303-01 Admission (Confirmed)   Admission   [facility] Account   Name Acct ID Class Status Primary Coverage  Deanna Holloway, Deanna Holloway [id number] Inpatient Open MEDICARE - MEDICARE PART A AND B      Guarantor Account (for Hospital Account [id number])   Name Relation to Pt Service Area Active? Acct Type  Deanna Holloway Self CHSA Yes Personal/Family  Address Phone    [address] [place], [place] [address] [phone])        Coverage Information (for Hospital Account [id number])   F/O Payor/Plan Precert #  MEDICARE/MEDICARE PART A AND B   Subscriber Subscriber #  [place], Dondero [id number]  Address Phone  [address] [place], [place] [address]

## 2017-07-17 NOTE — Clinical Social Work Note (Signed)
Per palliative care NP request a referral was sent to [facility].      Deanna Holloway, Clydene Pugh, LCSW

## 2017-07-17 NOTE — Progress Notes (Signed)
Daily Progress Note   Patient Name: Deanna Holloway       Date: 07/17/2017 DOB: 1945-12-11  Age: 71 y.o. MRN#: [id number] Attending Physician: Barton Dubois, MD Primary Care Physician: Neale Burly, MD Admit Date: 07/14/2017  Reason for Consultation/Follow-up: Establishing goals of care and Inpatient hospice referral  Subjective: Deanna Holloway is resting quietly in bed.  She greets me making them briefly keeping eye contact.  She is alert, oriented times person and place.  Present today at bedside is friend/HC POA, Saverio Danker.  We talked about her current health concerns including hypercalcemia.  I shared that she will continue to have high calcium due to her multiple myeloma that is not being treated at this time.  We talked about approximately 2 weeks have passed since her last hospitalization for hypercalcemia.  Prognosis discussed with permission.  I share that I would not be surprised if she had 2 weeks or less based on recurrent hypercalcemia.  We briefly discussed the relatively calm and peaceful passing that is expected due to hypercalcemia.  We talked about what we can and cannot change.  Patient and power of attorney are interested in residential hospice with [facility].  We talked about preferred place of death again.  Deanna Holloway states that her preference is to be at home, but endorses that if she does not know where she is, is no longer alert than she would be agreeable to residential hospice.  We speak in detail about what is and is not offered at residential hospice.  [name] states that he would like a conference with hospice related to residential care and what this would look like.  I share that hospice referral will likely be tomorrow.    Conference with social worker  related to plan of care and disposition. Conference with hospitalist related to plan of care and disposition.   Length of Stay: 3  Current Medications: Scheduled Meds:  . calcitonin (salmon)  1 spray Alternating Nares BID  . feeding supplement (ENSURE ENLIVE)  237 mL Oral TID BM  . heparin  5,000 Units Subcutaneous Q8H  . magnesium oxide  400 mg Oral Daily  . potassium chloride  40 mEq Oral Daily    Continuous Infusions: . sodium chloride 100 mL/hr at 07/17/17 0952    PRN Meds: acetaminophen, ondansetron **OR**  ondansetron (ZOFRAN) IV  Physical Exam  Constitutional: No distress.  Appears acutely/chronically ill, pale, makes him briefly keeps eye contact, appears frail  HENT:  Head: Atraumatic.  Some temporal wasting  Cardiovascular: Normal rate.  Pulmonary/Chest: Effort normal. No respiratory distress.  Abdominal: Soft. She exhibits no distension.  Musculoskeletal: She exhibits no edema.  Neurological: She is alert.  Oriented to person and place, unsure of month  Skin: Skin is warm and dry.  Psychiatric:  Calm and cooperative, not fearful  Nursing note and vitals reviewed.           Vital Signs: BP (!) 102/55 (BP Location: Left Arm)   Pulse 95   Temp 98.1 F (36.7 C) (Oral)   Resp 17   Ht _0  (1.6 m)   Wt 54.2 kg (119 lb 7.8 oz)   SpO2 96%   BMI 21.17 kg/m  SpO2: SpO2: 96 % O2 Device: O2 Device: Room Air O2 Flow Rate:    Intake/output summary:   Intake/Output Summary (Last 24 hours) at 07/17/2017 1116 Last data filed at 07/17/2017 0700 Gross per 24 hour  Intake 3240 ml  Output 2000 ml  Net 1240 ml   LBM: Last BM Date: 07/16/17 Baseline Weight: Weight: 50.3 kg (111 lb) Most recent weight: Weight: 54.2 kg (119 lb 7.8 oz)       Palliative Assessment/Data:    Flowsheet Rows     Most Recent Value  Intake Tab  Referral Department  Hospitalist  Unit at Time of Referral  Med/Surg Unit  Palliative Care Primary Diagnosis  Cancer  Date Notified   07/16/17  Palliative Care Type  Return patient Palliative Care  Reason for referral  Clarify Goals of Care  Date of Admission  07/14/17  Date first seen by Palliative Care  07/17/17  # of days Palliative referral response time  1 Day(s)  # of days IP prior to Palliative referral  2  Clinical Assessment  Pain Max last 24 hours  Not able to report  Pain Min Last 24 hours  Not able to report  Dyspnea Max Last 24 Hours  Not able to report  Dyspnea Min Last 24 hours  Not able to report  Psychosocial & Spiritual Assessment  Palliative Care Outcomes  Patient/Family meeting held?  Yes  Patient/Family wishes: Interventions discontinued/not started   Mechanical Ventilation      Patient Active Problem List   Diagnosis Date Noted  . Dehydration 07/14/2017  . Pressure ulcer with suspected deep tissue injury, stage II to buttocks 07/03/2017  . Pseudomonas urinary tract infection 07/03/2017  . Pancytopenia ([place]) 07/03/2017  . Severe protein-calorie malnutrition ([place]) 07/03/2017  . Underweight 07/03/2017  . Demand ischemia ([place]) 07/03/2017  . Elevated troponin 07/03/2017  . Grade II diastolic dysfunction 66/08/3014  . Acute metabolic encephalopathy 04/05/3233  . Hyponatremia 07/03/2017  . DNR (do not resuscitate)   . Encounter for hospice care discussion   . Goals of care, counseling/discussion   . Palliative care by specialist   . Disorder of perianal skin   . Hypercalcemia of malignancy 06/13/2017  . Hypokalemia 06/12/2017  . CKD (chronic kidney disease) stage 3, GFR 30-59 ml/min (HCC) 06/12/2017  . IDA (iron deficiency anemia) 03/30/2017  . Counseling regarding goals of care 09/15/2016  . Multiple myeloma not having achieved remission ([place]) 09/15/2016  . AKI (acute kidney injury) ([place]) 06/12/2016  . Anemia 06/12/2016  . Hypotension 06/12/2016  . Multiple myeloma ([place]) 06/07/2016  . Hypercalcemia 05/13/2016  . Abdominal pain  05/12/2016    Palliative Care Assessment & Plan    Patient Profile: 72 y.o.femalewith past medical history of multiple myeloma without remission, hypercalcemia, broken arm, esophageal dilationadmitted on 4/19/2019with recurrent hypercalcemiasecondary to multiple myeloma, UTI.   Assessment: hypercalcemiasecondary to multiple myeloma: Admitted with calcium of 14.8 corrected for low albumin to 18.5.  Improved some with hydration and medication. Recurrence likely related to untreated multiple myeloma.  Recommendations/Plan:  Requesting residential hospice, comfort and dignity at end of life, [facility].     Goals of Care and Additional Recommendations:  Limitations on Scope of Treatment: Residential hospice for comfort and dignity and end of life, requesting to continue some medications.  Code Status:    Code Status Orders  (From admission, onward)        Start     Ordered   07/14/17 1822  Do not attempt resuscitation (DNR)  Continuous    Question Answer Comment  In the event of cardiac or respiratory ARREST Do not call a "code blue"   In the event of cardiac or respiratory ARREST Do not perform Intubation, CPR, defibrillation or ACLS   In the event of cardiac or respiratory ARREST Use medication by any route, position, wound care, and other measures to relive pain and suffering. May use oxygen, suction and manual treatment of airway obstruction as needed for comfort.      07/14/17 1825    Code Status History    Date Active Date Inactive Code Status Order ID Comments User Context   06/30/2017 1307 07/05/2017 1742 DNR [id number]  Drue Novel, NP Inpatient   06/29/2017 2348 06/30/2017 1307 Full Code [id number]  Oswald Hillock, MD Inpatient   06/12/2017 1950 06/18/2017 0107 Partial Code [id number]  Debbe Odea, MD ED   06/12/2016 1537 06/13/2016 1957 Full Code [id number]  Reubin Milan, MD ED    Advance Directive Documentation     Most Recent Value  Type of Advance Directive  Healthcare Power of Attorney   Pre-existing out of facility DNR order (yellow form or pink MOST form)  Physician notified to receive inpatient order  "MOST" Form in Place?  -       Prognosis:   < 2 weeks, would not be surprising based on recurrent hypercalcemia of malignancy with calcium at 14.8 corrected for low albumin to 18.5, albumin of 1.7, frailty, bedbound status.  Discharge Planning:  Requesting residential hospice with [facility] was discussed with nursing staff, social worker, and Dr. Dyann Kief.  Thank you for allowing the Palliative Medicine Team to assist in the care of this patient.   Time In: 1240 Time Out: 1330 Total Time 50 minutes Prolonged Time Billed  yes       Greater than 50%  of this time was spent counseling and coordinating care related to the above assessment and plan.  Drue Novel, NP  Please contact Palliative Medicine Team phone at [phone] for questions and concerns.

## 2017-07-18 LAB — COMPREHENSIVE METABOLIC PANEL
ALBUMIN: 1.5 g/dL — AB (ref 3.5–5.0)
ALK PHOS: 70 U/L (ref 38–126)
ALT: 15 U/L (ref 14–54)
AST: 30 U/L (ref 15–41)
Anion gap: 9 (ref 5–15)
BILIRUBIN TOTAL: 0.4 mg/dL (ref 0.3–1.2)
BUN: 28 mg/dL — ABNORMAL HIGH (ref 6–20)
CO2: 24 mmol/L (ref 22–32)
Calcium: 14.5 mg/dL (ref 8.9–10.3)
Chloride: 106 mmol/L (ref 101–111)
Creatinine, Ser: 1.15 mg/dL — ABNORMAL HIGH (ref 0.44–1.00)
GFR calc Af Amer: 54 mL/min — ABNORMAL LOW (ref >60)
GFR calc non Af Amer: 46 mL/min — ABNORMAL LOW (ref >60)
GLUCOSE: 82 mg/dL (ref 65–99)
POTASSIUM: 3.4 mmol/L — AB (ref 3.5–5.1)
Sodium: 139 mmol/L (ref 135–145)
TOTAL PROTEIN: 6.2 g/dL — AB (ref 6.5–8.1)

## 2017-07-18 MED ORDER — CALCITONIN (SALMON) 200 UNIT/ACT NA SOLN: 1.0000 | Freq: Two times a day (BID) | NASAL | Status: AC

## 2017-07-18 MED ORDER — ENSURE ENLIVE PO LIQD: 237.0000 mL | Freq: Three times a day (TID) | ORAL | 12 refills | Status: AC

## 2017-07-18 NOTE — Care Management (Addendum)
Patient/family has elected to go home and continue with hospice services. EMS transport arranged for 4 pm.  [facility] [place] notified.

## 2017-07-18 NOTE — Discharge Summary (Signed)
Physician Discharge Summary  Deanna Holloway:[id number] DOB: 01-08-1946 DOA: 07/14/2017  PCP: Neale Burly, MD  Admit date: 07/14/2017 Discharge date: 07/18/2017  Time spent: 35 minutes  Recommendations for Outpatient Follow-up:  1. Full comfort care 2. Symptomatic management only 3. No further blood work or re-hospitalization 4. No intravenous therapy, transfusion or any interventions.   Discharge Diagnoses:  Active Problems:   Hypercalcemia   Multiple myeloma not having achieved remission (HCC)   Hypokalemia   CKD (chronic kidney disease) stage 3, GFR 30-59 ml/min (HCC)   Hypercalcemia of malignancy   Pancytopenia (HCC)   Severe protein-calorie malnutrition (HCC)   Grade II diastolic dysfunction   Dehydration   Discharge Condition: stable and overall improved. Discharge home with home hospice.  Diet recommendation: comfort feeding   Filed Weights   07/16/17 0500 07/17/17 0500 07/18/17 0534  Weight: 53.2 kg (117 lb 4.6 oz) 54.2 kg (119 lb 7.8 oz) 52.8 kg (116 lb 6.5 oz)    History of present illness:  72 y/o with pmh significant for severe protein calorie malnutrition, CKD stage 3, MM not in remission, malignant hypercalcemia and multiple admissions with dehydration and elevated calcium level; who came to [facility] secondary to increase fatigue, somnolence and anorexia. Patient's denies CP, SOB, nausea, vomiting, HA's.  Hospital Course:  1-MalignantHypercalcemia -patient calcium last check 14.5 -after GOC discussion decision made for full comfort care -no future re-hospitalization, no blood work, no further intravenous treatment/transfusion and just symptomatic management planned. -arrangements made for home hospice.  -continue calcitonin BID  2-Multiple myeloma not having achieved remission ([place]) -patient now under hospice and plans for full comfort care -no further blood work  -no re-hospitalization -symptomatic management only  -follow palliative care  consult -respect DNR wishes  3-Hypokalemia -in the setting of dehydration and poor intake -repleted and was WNL at discharge -no further blood work anticipated.  -plan is for full comfort care.   4-CKD (chronic kidney disease) stage 3, GFR 30-59 ml/min (HCC) -Last BMET demonstrated Cr 1.18 after IVF's -plan is for full comfort care and no further blood work is planned.   5-Pancytopenia ([place]) -associated with underlying malignancy  -not need for transfusion or plans for any future transfusion  -after discussion regarding goals of care and advance directives, decision made for full comfort, no future re-hospitalization and just symptomatic managements.  6-Severe protein-calorie malnutrition ([place]) -continue to have anorexia -Continue Ensure as tolerated in comfort feeding effort.   7-Grade II diastolic dysfunction -Remains compensated -after discussion regarding goals of care and advance directives, decision made for full comfort, no future re-hospitalization and just symptomatic managements. -arrangements made for home hospice.   8-Dehydration -better/improved with IVF resuscitation -encourage to continue oral intake as tolerated -after discussion regarding goals of care and advance directives, decision made for full comfort, no future re-hospitalization and just symptomatic managements. -arrangements made for home hospice.   9-urinary retention -plan is for chronic foley with exchanges once a month -comfort care approach  Procedures:  None   Consultations:  Palliative care  Discharge Exam: Vitals:   07/17/17 2033 07/18/17 0534  BP: 103/62 108/63  Pulse: 96 98  Resp: 16 16  Temp: 98 F (36.7 C) 98 F (36.7 C)  SpO2: 97% 97%    General: No fever, frail, chronically ill in appearance and complaining of being generally weak.  Patient continued to have decreased appetite, but expressed that she is drinking more.  Patient denies shortness of breath, chest pain,  abdominal pain, nausea, vomiting or  any other complaints.  Cardiovascular: No JVD, S1 and S2, no rubs, no gallops, no murmurs.   Respiratory: Good air movement bilaterally, no crackles, no wheezing, no rhonchi.  Abdomen: Soft, nontender, nondistended, positive bowel sounds.   Musculoskeletal: No edema, no cyanosis or clubbing.     Discharge Instructions   Discharge Instructions    Discharge instructions   Complete by:  As directed    Full comfort care Hospice at home Symptomatic management  No further re-hospitalization     Allergies as of 07/18/2017      Reactions   Lasix [furosemide] Swelling   Adhesive [tape] Rash   Other    Antihistamine and steroids   Velcade [bortezomib]    Other.  Adverse reaction; POA states "and steroid that went with Velcade."   Nickel Rash      Medication List    STOP taking these medications   Magnesium 200 MG Tabs     TAKE these medications   acetaminophen 500 MG tablet Commonly known as:  TYLENOL Take 500 mg by mouth every 6 (six) hours as needed for moderate pain.   calcitonin (salmon) 200 UNIT/ACT nasal spray Commonly known as:  MIACALCIN/FORTICAL Place 1 spray into alternate nostrils 2 (two) times daily. What changed:  when to take this   feeding supplement (ENSURE ENLIVE) Liqd Take 237 mLs by mouth 3 (three) times daily between meals.      Allergies  Allergen Reactions  . Lasix [Furosemide] Swelling  . Adhesive [Tape] Rash  . Other     Antihistamine and steroids   . Velcade [Bortezomib]     Other.  Adverse reaction; POA states "and steroid that went with Velcade."  . Nickel Rash    The results of significant diagnostics from this hospitalization (including imaging, microbiology, ancillary and laboratory) are listed below for reference.    Significant Diagnostic Studies: Dg Abdomen Acute W/chest  Result Date: 06/29/2017 CLINICAL DATA:  Abdominal pain and weakness. EXAM: DG ABDOMEN ACUTE W/ 1V CHEST COMPARISON:   Abdominal x-ray dated June 13, 2017. FINDINGS: The cardiomediastinal silhouette is normal in size. Normal pulmonary vascularity. No focal consolidation, pleural effusion, or pneumothorax. No acute osseous abnormality. Healed fracture of the left proximal humerus. Stable gaseous colonic distention with overall unchanged moderate colonic stool burden. Large amount of stool again seen within the rectum. No dilated loops of small bowel. No pneumoperitoneum. IMPRESSION: 1. Unchanged moderate colonic stool burden and large amount of stool within the rectum. Correlate for fecal impaction. 2. No bowel obstruction. 3.  No active cardiopulmonary disease. Electronically Signed   By: Titus Dubin M.D.   On: 06/29/2017 17:50   Labs: Basic Metabolic Panel: Recent Labs  Lab 07/14/17 1552 07/14/17 2110 07/15/17 0607 07/16/17 0611 07/17/17 0618 07/18/17 0550  NA 138  --  139 137 138 139  K 2.9*  --  3.1* 3.1* 3.9 3.4*  CL 99*  --  104 102 103 106  CO2 29  --  '27 27 25 24  ' GLUCOSE 130*  --  91 157* 84 82  BUN 35*  --  32* 31* 33* 28*  CREATININE 1.36* 1.30* 1.27* 1.19* 1.18* 1.15*  CALCIUM >15.0*  --  16.7* >15.0* 14.8* 14.5*  MG 2.2 2.1  --   --   --   --   PHOS 3.8 3.6  --   --   --   --    Liver Function Tests: Recent Labs  Lab 07/14/17 1552 07/18/17 0550  AST 47*  30  ALT 14 15  ALKPHOS 80 70  BILITOT 0.6 0.4  PROT 7.2 6.2*  ALBUMIN 1.7* 1.5*   CBC: Recent Labs  Lab 07/14/17 1552 07/14/17 2110 07/15/17 0607  WBC 3.8* 3.8* 3.4*  NEUTROABS 2.6  --   --   HGB 9.6* 8.6* 8.4*  HCT 29.0* 26.3* 25.3*  MCV 91.8 91.6 92.3  PLT 133* 129* 123*    Signed:  Barton Dubois MD.  [facility] 07/18/2017, 3:06 PM

## 2017-07-18 NOTE — Progress Notes (Signed)
CRITICAL VALUE ALERT  Critical Value:  Calcium 14.5  Date & Time Notied:  07/18/2017 @0753   Provider Notified: Dr Dyann Kief  Orders Received/Actions taken: Awaiting

## 2017-07-18 NOTE — Care Management Important Message (Signed)
Important Message  Patient Details  Name: Deanna Holloway MRN: [id number] Date of Birth: [birth year]   Medicare Important Message Given:  Yes    Shruti Arrey, Chauncey Reading, RN 07/18/2017, 3:18 PM

## 2017-07-20 DIAGNOSIS — N183 Chronic kidney disease, stage 3 (moderate): Secondary | ICD-10-CM | POA: Diagnosis not present

## 2017-07-20 DIAGNOSIS — K5790 Diverticulosis of intestine, part unspecified, without perforation or abscess without bleeding: Secondary | ICD-10-CM | POA: Diagnosis not present

## 2017-07-20 DIAGNOSIS — K6289 Other specified diseases of anus and rectum: Secondary | ICD-10-CM | POA: Diagnosis not present

## 2017-07-20 DIAGNOSIS — E43 Unspecified severe protein-calorie malnutrition: Secondary | ICD-10-CM | POA: Diagnosis not present

## 2017-07-20 DIAGNOSIS — C9000 Multiple myeloma not having achieved remission: Secondary | ICD-10-CM | POA: Diagnosis not present

## 2017-07-21 DIAGNOSIS — K5790 Diverticulosis of intestine, part unspecified, without perforation or abscess without bleeding: Secondary | ICD-10-CM | POA: Diagnosis not present

## 2017-07-21 DIAGNOSIS — K6289 Other specified diseases of anus and rectum: Secondary | ICD-10-CM | POA: Diagnosis not present

## 2017-07-21 DIAGNOSIS — E43 Unspecified severe protein-calorie malnutrition: Secondary | ICD-10-CM | POA: Diagnosis not present

## 2017-07-21 DIAGNOSIS — C9000 Multiple myeloma not having achieved remission: Secondary | ICD-10-CM | POA: Diagnosis not present

## 2017-07-21 DIAGNOSIS — N183 Chronic kidney disease, stage 3 (moderate): Secondary | ICD-10-CM | POA: Diagnosis not present

## 2017-07-24 DIAGNOSIS — N183 Chronic kidney disease, stage 3 (moderate): Secondary | ICD-10-CM | POA: Diagnosis not present

## 2017-07-24 DIAGNOSIS — C9000 Multiple myeloma not having achieved remission: Secondary | ICD-10-CM | POA: Diagnosis not present

## 2017-07-24 DIAGNOSIS — E43 Unspecified severe protein-calorie malnutrition: Secondary | ICD-10-CM | POA: Diagnosis not present

## 2017-07-24 DIAGNOSIS — K6289 Other specified diseases of anus and rectum: Secondary | ICD-10-CM | POA: Diagnosis not present

## 2017-07-24 DIAGNOSIS — K5790 Diverticulosis of intestine, part unspecified, without perforation or abscess without bleeding: Secondary | ICD-10-CM | POA: Diagnosis not present

## 2017-07-26 DIAGNOSIS — K5790 Diverticulosis of intestine, part unspecified, without perforation or abscess without bleeding: Secondary | ICD-10-CM | POA: Diagnosis not present

## 2017-07-26 DIAGNOSIS — E43 Unspecified severe protein-calorie malnutrition: Secondary | ICD-10-CM | POA: Diagnosis not present

## 2017-07-26 DIAGNOSIS — K6289 Other specified diseases of anus and rectum: Secondary | ICD-10-CM | POA: Diagnosis not present

## 2017-07-26 DIAGNOSIS — C9000 Multiple myeloma not having achieved remission: Secondary | ICD-10-CM | POA: Diagnosis not present

## 2017-07-26 DIAGNOSIS — N183 Chronic kidney disease, stage 3 (moderate): Secondary | ICD-10-CM | POA: Diagnosis not present

## 2017-07-27 DIAGNOSIS — K5790 Diverticulosis of intestine, part unspecified, without perforation or abscess without bleeding: Secondary | ICD-10-CM | POA: Diagnosis not present

## 2017-07-27 DIAGNOSIS — K6289 Other specified diseases of anus and rectum: Secondary | ICD-10-CM | POA: Diagnosis not present

## 2017-07-27 DIAGNOSIS — N183 Chronic kidney disease, stage 3 (moderate): Secondary | ICD-10-CM | POA: Diagnosis not present

## 2017-07-27 DIAGNOSIS — E43 Unspecified severe protein-calorie malnutrition: Secondary | ICD-10-CM | POA: Diagnosis not present

## 2017-07-27 DIAGNOSIS — C9000 Multiple myeloma not having achieved remission: Secondary | ICD-10-CM | POA: Diagnosis not present

## 2017-07-28 DIAGNOSIS — E43 Unspecified severe protein-calorie malnutrition: Secondary | ICD-10-CM | POA: Diagnosis not present

## 2017-07-28 DIAGNOSIS — C9000 Multiple myeloma not having achieved remission: Secondary | ICD-10-CM | POA: Diagnosis not present

## 2017-07-28 DIAGNOSIS — K6289 Other specified diseases of anus and rectum: Secondary | ICD-10-CM | POA: Diagnosis not present

## 2017-07-28 DIAGNOSIS — K5790 Diverticulosis of intestine, part unspecified, without perforation or abscess without bleeding: Secondary | ICD-10-CM | POA: Diagnosis not present

## 2017-07-28 DIAGNOSIS — N183 Chronic kidney disease, stage 3 (moderate): Secondary | ICD-10-CM | POA: Diagnosis not present

## 2017-07-31 DIAGNOSIS — E43 Unspecified severe protein-calorie malnutrition: Secondary | ICD-10-CM | POA: Diagnosis not present

## 2017-07-31 DIAGNOSIS — C9000 Multiple myeloma not having achieved remission: Secondary | ICD-10-CM | POA: Diagnosis not present

## 2017-07-31 DIAGNOSIS — K5790 Diverticulosis of intestine, part unspecified, without perforation or abscess without bleeding: Secondary | ICD-10-CM | POA: Diagnosis not present

## 2017-07-31 DIAGNOSIS — K6289 Other specified diseases of anus and rectum: Secondary | ICD-10-CM | POA: Diagnosis not present

## 2017-07-31 DIAGNOSIS — N183 Chronic kidney disease, stage 3 (moderate): Secondary | ICD-10-CM | POA: Diagnosis not present

## 2017-08-02 DIAGNOSIS — E43 Unspecified severe protein-calorie malnutrition: Secondary | ICD-10-CM | POA: Diagnosis not present

## 2017-08-02 DIAGNOSIS — K6289 Other specified diseases of anus and rectum: Secondary | ICD-10-CM | POA: Diagnosis not present

## 2017-08-02 DIAGNOSIS — N183 Chronic kidney disease, stage 3 (moderate): Secondary | ICD-10-CM | POA: Diagnosis not present

## 2017-08-02 DIAGNOSIS — C9000 Multiple myeloma not having achieved remission: Secondary | ICD-10-CM | POA: Diagnosis not present

## 2017-08-02 DIAGNOSIS — K5790 Diverticulosis of intestine, part unspecified, without perforation or abscess without bleeding: Secondary | ICD-10-CM | POA: Diagnosis not present

## 2017-08-04 DIAGNOSIS — C9000 Multiple myeloma not having achieved remission: Secondary | ICD-10-CM | POA: Diagnosis not present

## 2017-08-04 DIAGNOSIS — K5790 Diverticulosis of intestine, part unspecified, without perforation or abscess without bleeding: Secondary | ICD-10-CM | POA: Diagnosis not present

## 2017-08-04 DIAGNOSIS — E43 Unspecified severe protein-calorie malnutrition: Secondary | ICD-10-CM | POA: Diagnosis not present

## 2017-08-04 DIAGNOSIS — K6289 Other specified diseases of anus and rectum: Secondary | ICD-10-CM | POA: Diagnosis not present

## 2017-08-04 DIAGNOSIS — N183 Chronic kidney disease, stage 3 (moderate): Secondary | ICD-10-CM | POA: Diagnosis not present

## 2017-08-05 DIAGNOSIS — E43 Unspecified severe protein-calorie malnutrition: Secondary | ICD-10-CM | POA: Diagnosis not present

## 2017-08-05 DIAGNOSIS — K6289 Other specified diseases of anus and rectum: Secondary | ICD-10-CM | POA: Diagnosis not present

## 2017-08-05 DIAGNOSIS — K5790 Diverticulosis of intestine, part unspecified, without perforation or abscess without bleeding: Secondary | ICD-10-CM | POA: Diagnosis not present

## 2017-08-05 DIAGNOSIS — N183 Chronic kidney disease, stage 3 (moderate): Secondary | ICD-10-CM | POA: Diagnosis not present

## 2017-08-05 DIAGNOSIS — C9000 Multiple myeloma not having achieved remission: Secondary | ICD-10-CM | POA: Diagnosis not present

## 2017-08-07 DIAGNOSIS — K5790 Diverticulosis of intestine, part unspecified, without perforation or abscess without bleeding: Secondary | ICD-10-CM | POA: Diagnosis not present

## 2017-08-07 DIAGNOSIS — E43 Unspecified severe protein-calorie malnutrition: Secondary | ICD-10-CM | POA: Diagnosis not present

## 2017-08-07 DIAGNOSIS — C9000 Multiple myeloma not having achieved remission: Secondary | ICD-10-CM | POA: Diagnosis not present

## 2017-08-07 DIAGNOSIS — K6289 Other specified diseases of anus and rectum: Secondary | ICD-10-CM | POA: Diagnosis not present

## 2017-08-07 DIAGNOSIS — N183 Chronic kidney disease, stage 3 (moderate): Secondary | ICD-10-CM | POA: Diagnosis not present

## 2017-08-08 DIAGNOSIS — K5790 Diverticulosis of intestine, part unspecified, without perforation or abscess without bleeding: Secondary | ICD-10-CM | POA: Diagnosis not present

## 2017-08-08 DIAGNOSIS — N183 Chronic kidney disease, stage 3 (moderate): Secondary | ICD-10-CM | POA: Diagnosis not present

## 2017-08-08 DIAGNOSIS — K6289 Other specified diseases of anus and rectum: Secondary | ICD-10-CM | POA: Diagnosis not present

## 2017-08-08 DIAGNOSIS — E43 Unspecified severe protein-calorie malnutrition: Secondary | ICD-10-CM | POA: Diagnosis not present

## 2017-08-08 DIAGNOSIS — C9000 Multiple myeloma not having achieved remission: Secondary | ICD-10-CM | POA: Diagnosis not present

## 2017-08-09 DIAGNOSIS — C9000 Multiple myeloma not having achieved remission: Secondary | ICD-10-CM | POA: Diagnosis not present

## 2017-08-09 DIAGNOSIS — K6289 Other specified diseases of anus and rectum: Secondary | ICD-10-CM | POA: Diagnosis not present

## 2017-08-09 DIAGNOSIS — K5790 Diverticulosis of intestine, part unspecified, without perforation or abscess without bleeding: Secondary | ICD-10-CM | POA: Diagnosis not present

## 2017-08-09 DIAGNOSIS — N183 Chronic kidney disease, stage 3 (moderate): Secondary | ICD-10-CM | POA: Diagnosis not present

## 2017-08-09 DIAGNOSIS — E43 Unspecified severe protein-calorie malnutrition: Secondary | ICD-10-CM | POA: Diagnosis not present

## 2017-08-26 DEATH — deceased

## 2019-01-02 IMAGING — DX DG SHOULDER 2+V*L*
2 series · 2 of 2 positions shown · non-contrast
Comparison: None.

CLINICAL DATA: Shoulder pain after falling backwards tonight onto a
hardwood floor.

EXAM:
LEFT SHOULDER - 2+ VIEW

[shoulder grashey]
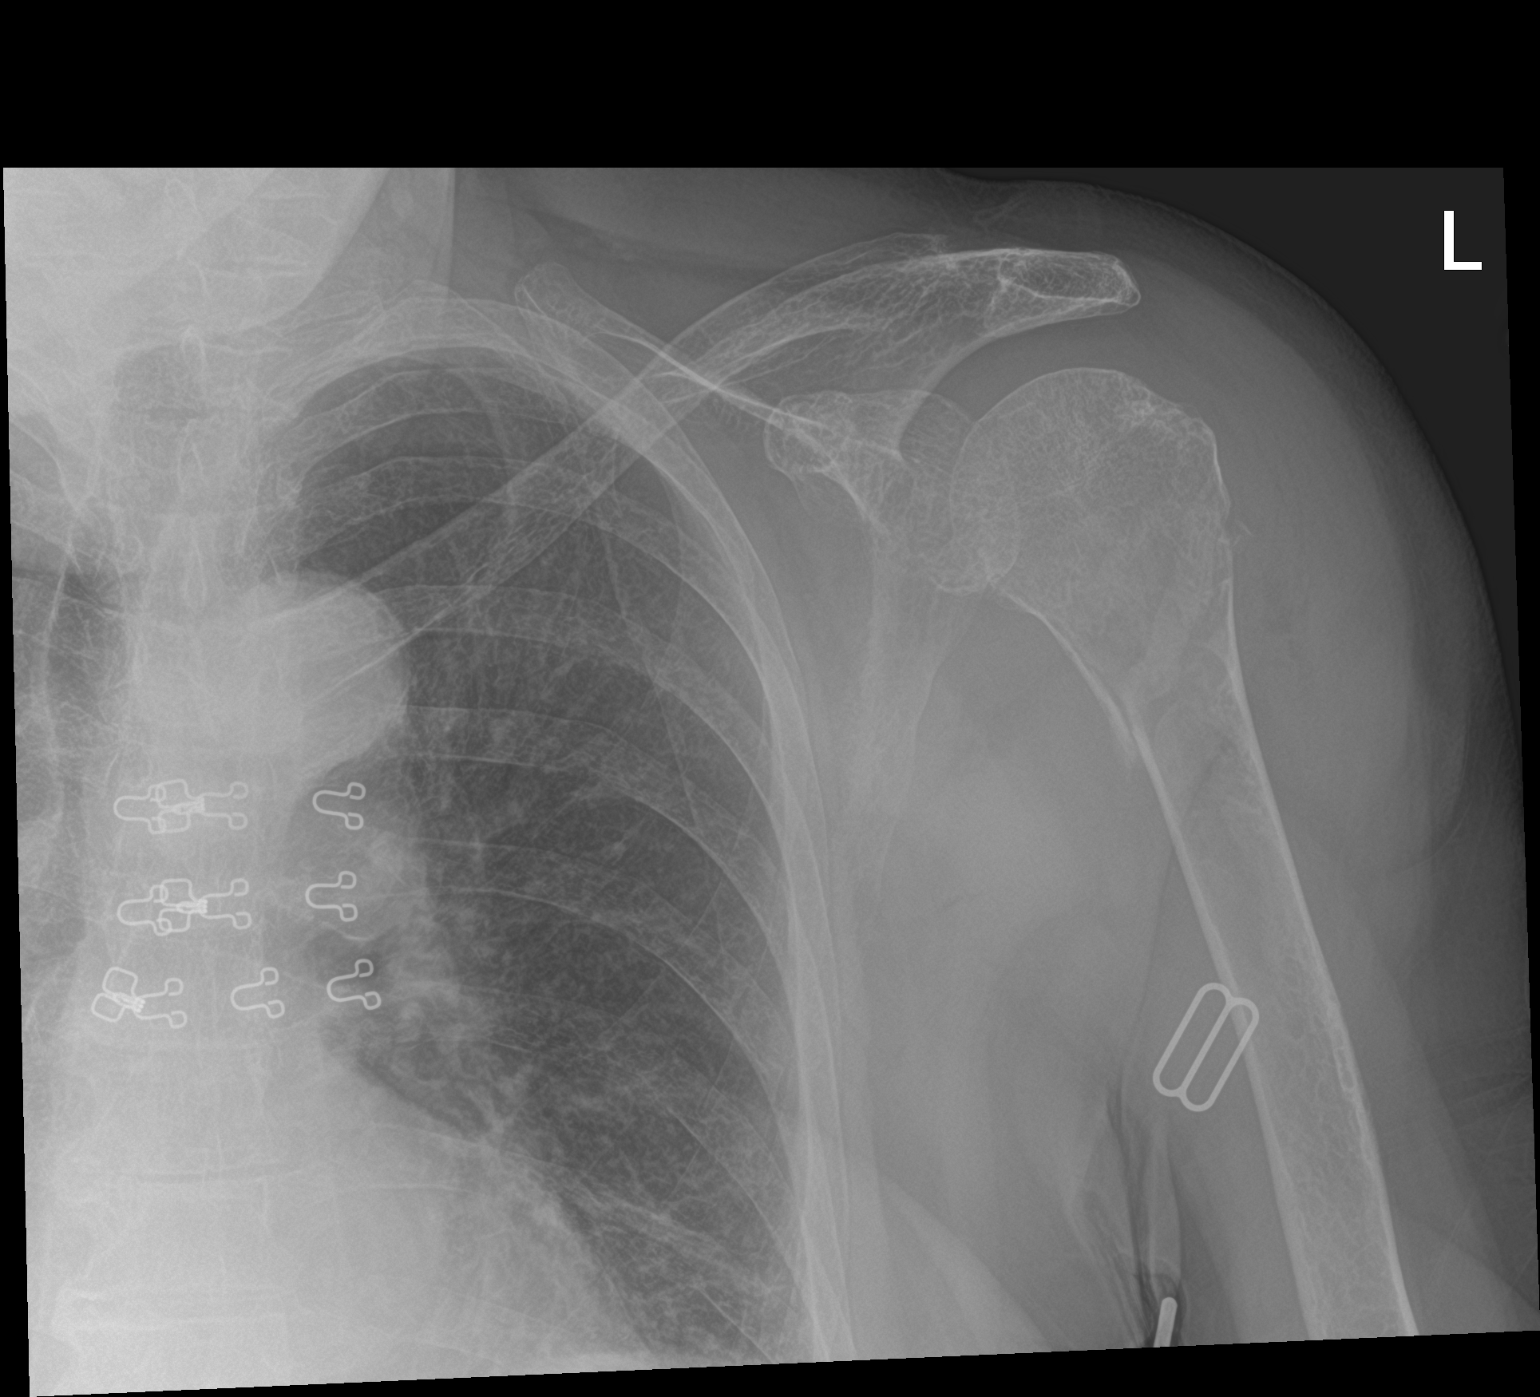

[shoulder y view]
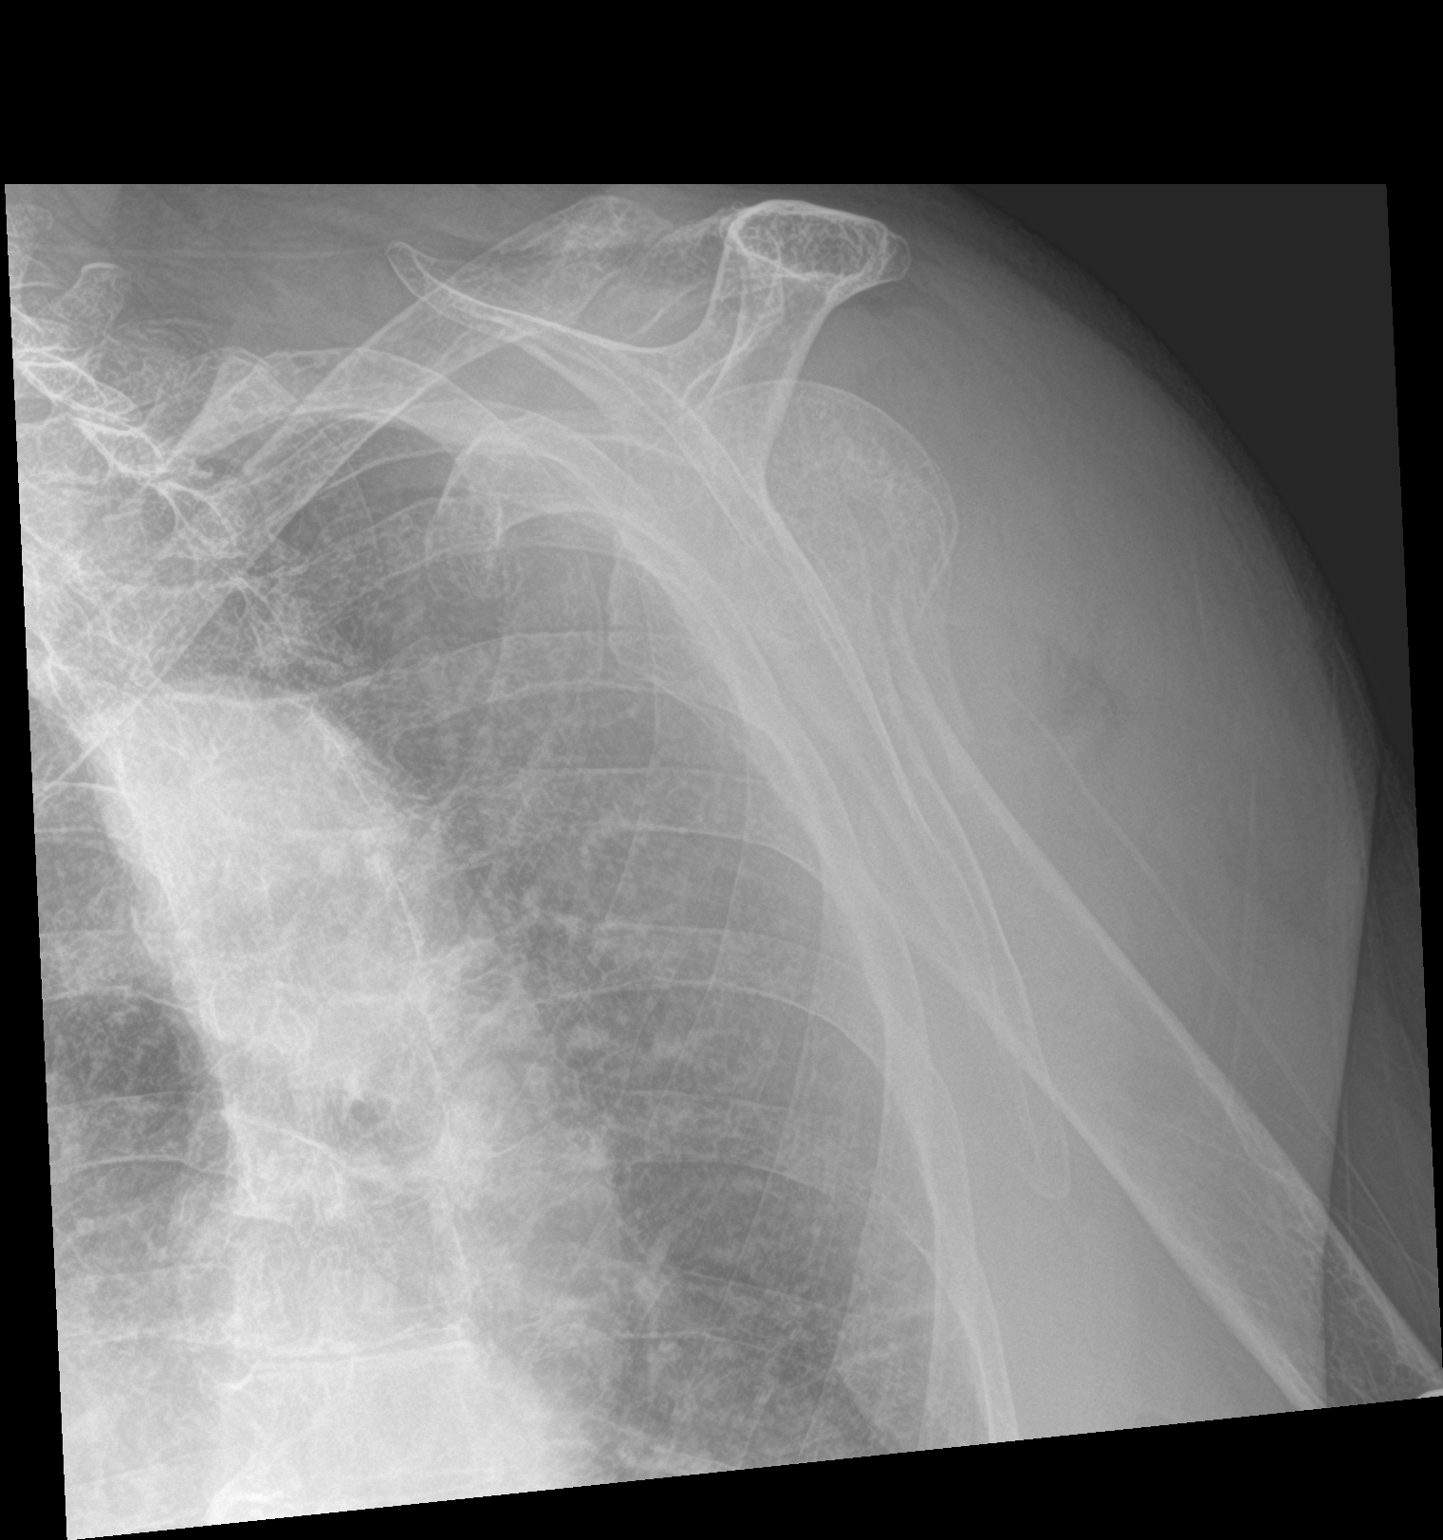

[2 of 2 positions shown; findings below may reference images not displayed]

FINDINGS: There is an acute proximal humeral fracture with good alignment and
position. The fracture is obliquely oriented through the proximal
diaphysis and surgical neck. No dislocation. No acute soft tissue
abnormality.
IMPRESSION: Proximal humeral fracture.

## 2019-01-15 IMAGING — DX DG CHEST 2V
2 series · 2 of 2 positions shown · non-contrast
Comparison: Plain films of the left shoulder 04/29/2016.

CLINICAL DATA: Cough and weakness.

EXAM:
CHEST  2 VIEW

[chest lat]
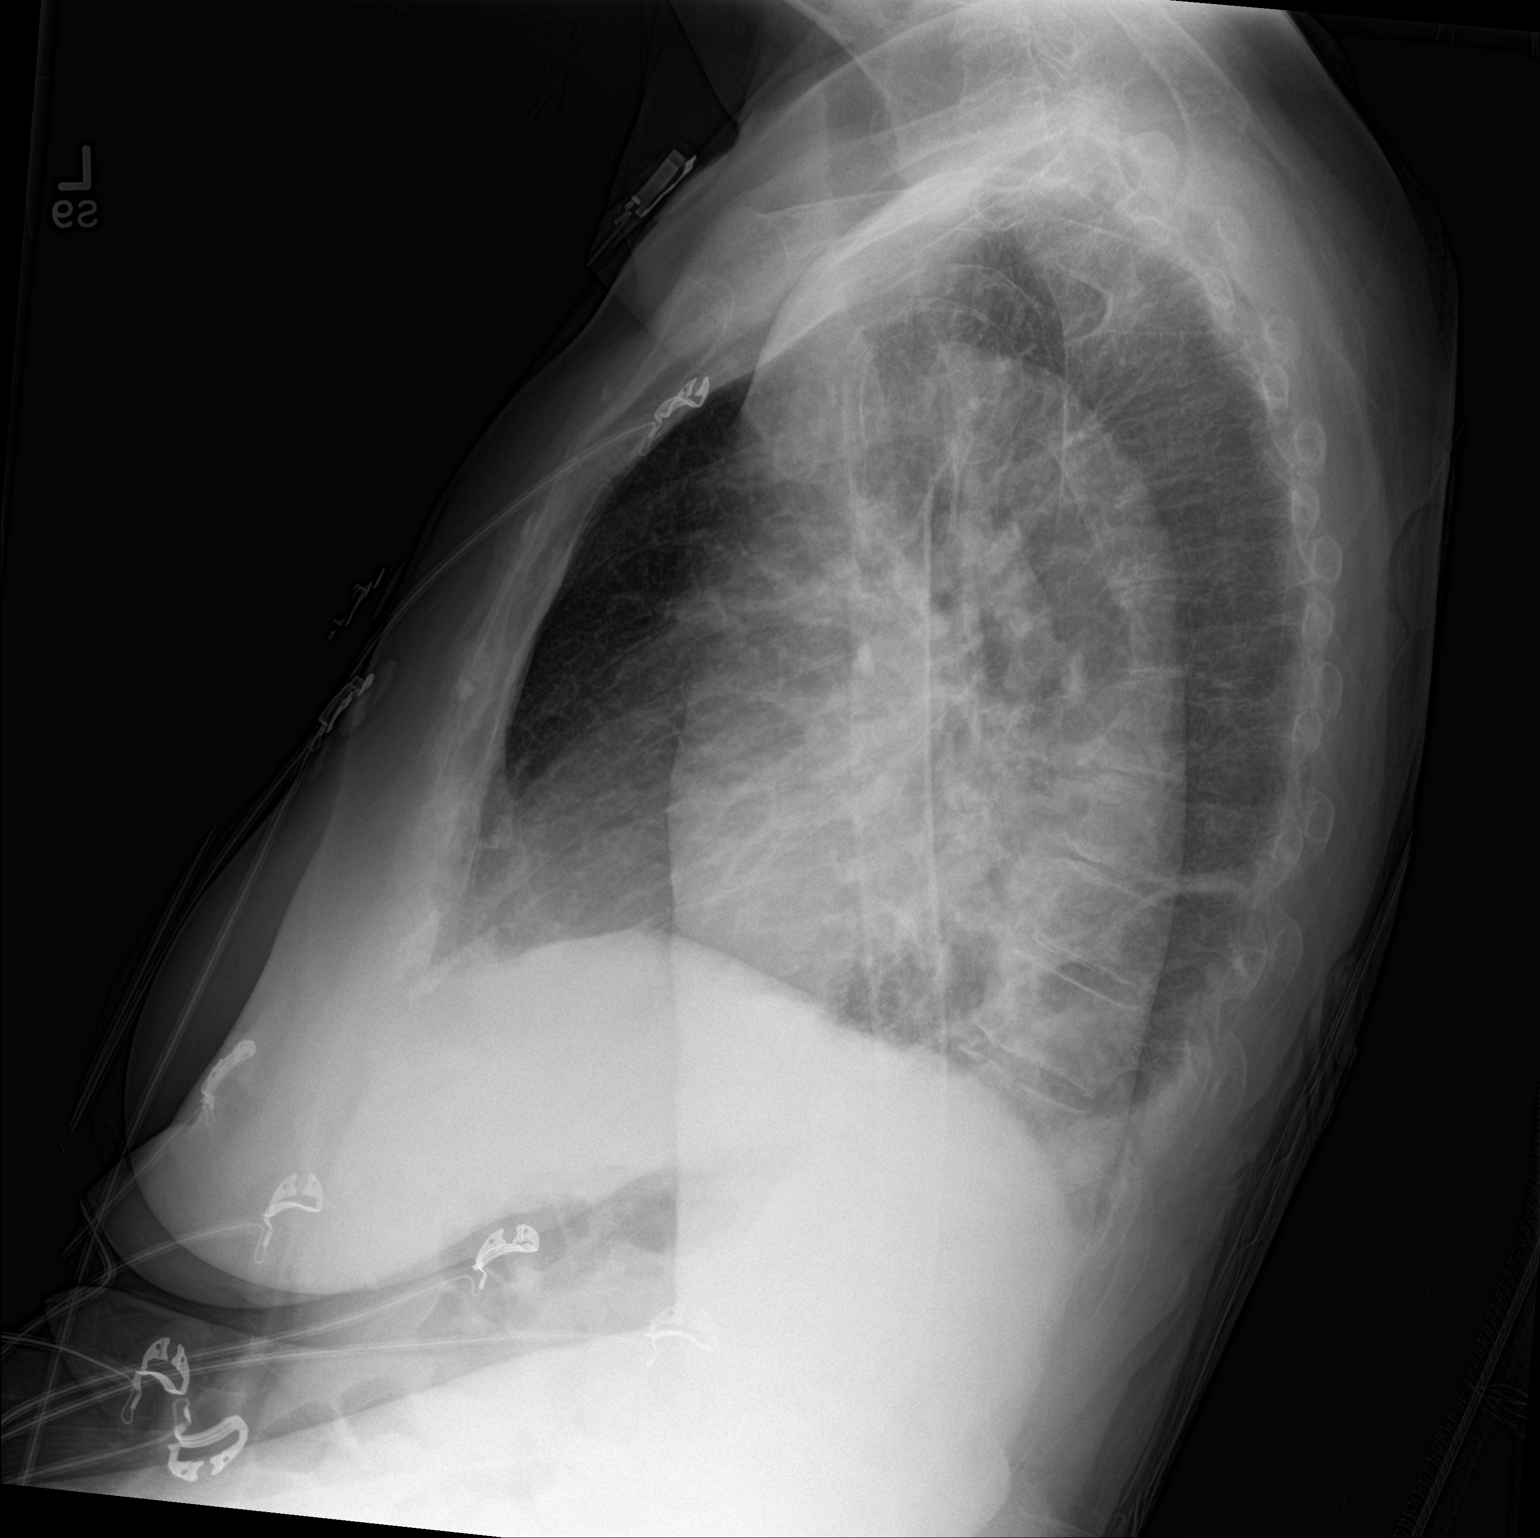

[chest ap]
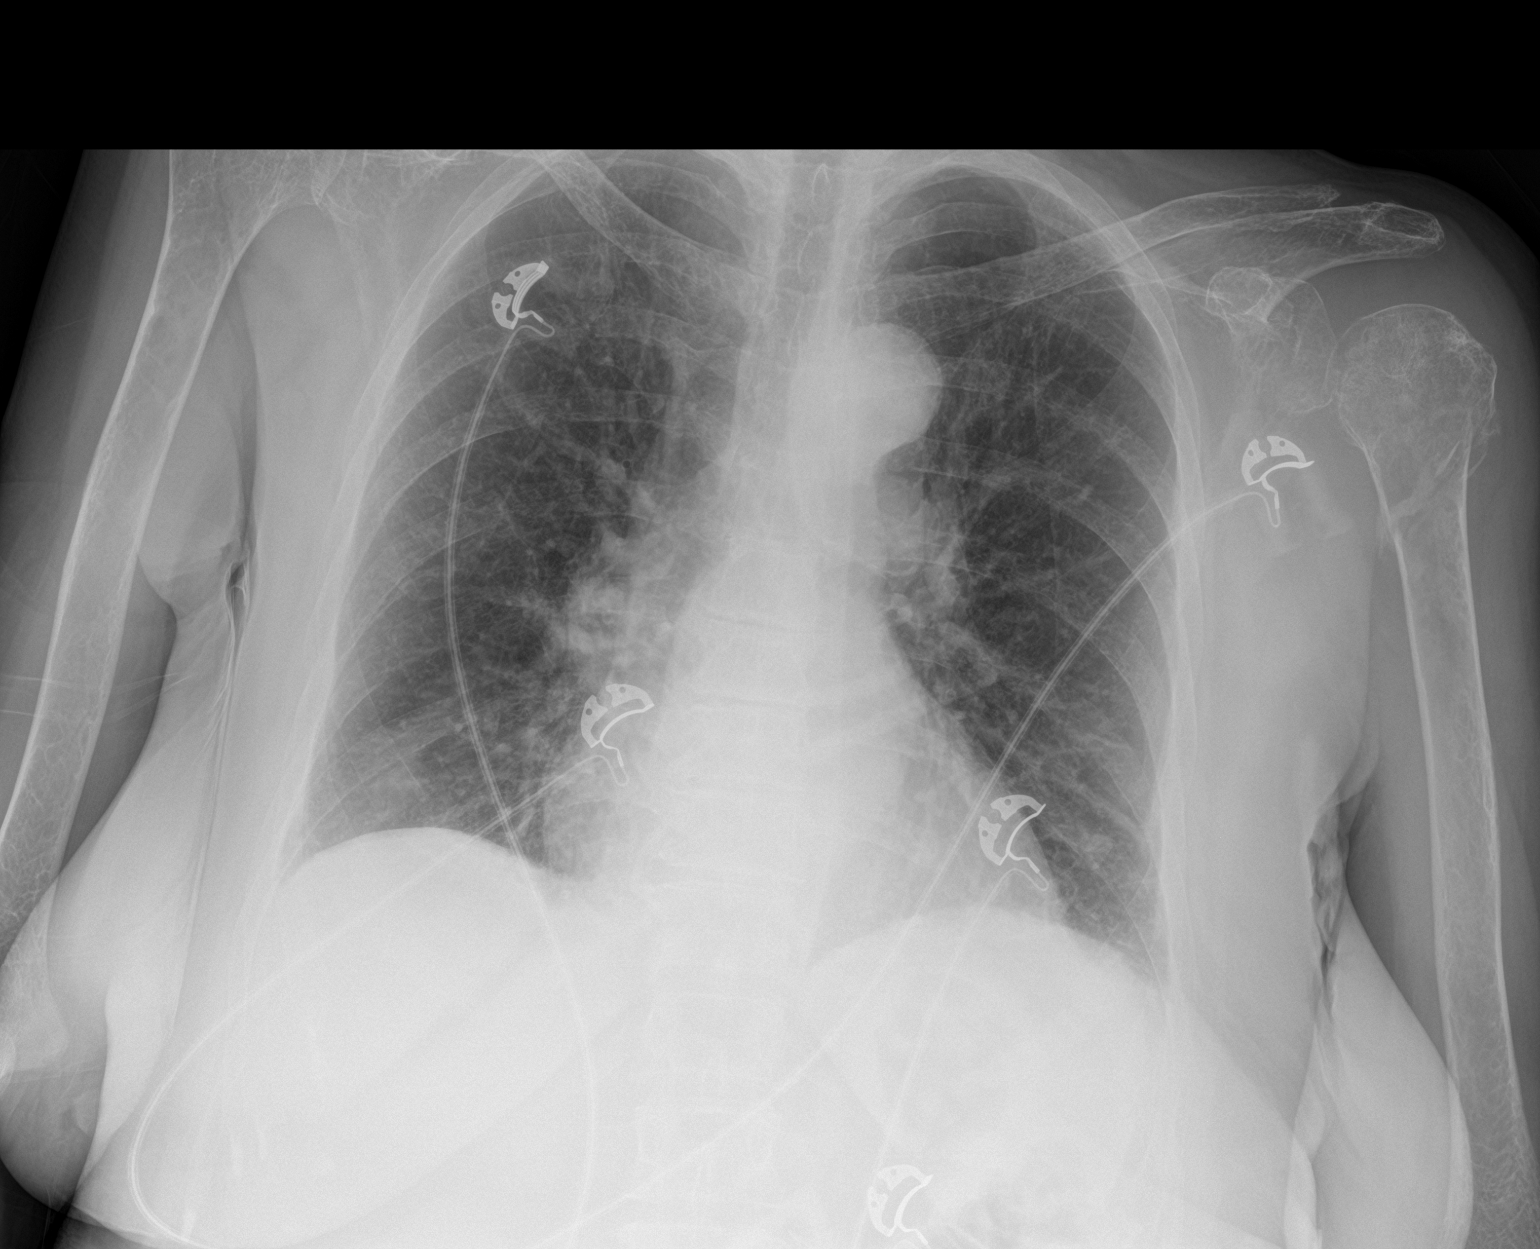

[2 of 2 positions shown; findings below may reference images not displayed]

FINDINGS: Lungs are clear. Heart size is upper normal. No pneumothorax or
pleural effusion. Proximal left humerus fracture is identified as on
the prior exam.
IMPRESSION: No acute disease.

## 2019-01-15 IMAGING — CT CT ABD-PELV W/O CM
2 of 7 series · 14 of 46 positions shown, 18 images · non-contrast
Comparison: None.

CLINICAL DATA: Hemorrhoids with bright red blood upon wiping but no
blood in stool.

EXAM:
CT ABDOMEN AND PELVIS WITHOUT CONTRAST
TECHNIQUE: Multidetector CT imaging of the abdomen and pelvis was performed
following the standard protocol without IV contrast.

[Series 2: axial st · axial · 0.77mm/px · z∈[-350,+10]mm · 11 of 85 slices shown, 15 images]
[im 9/85  soft-tissue]
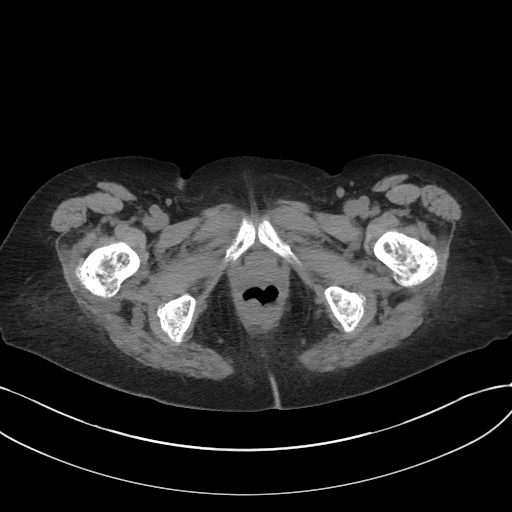
[im 9/85  bone]
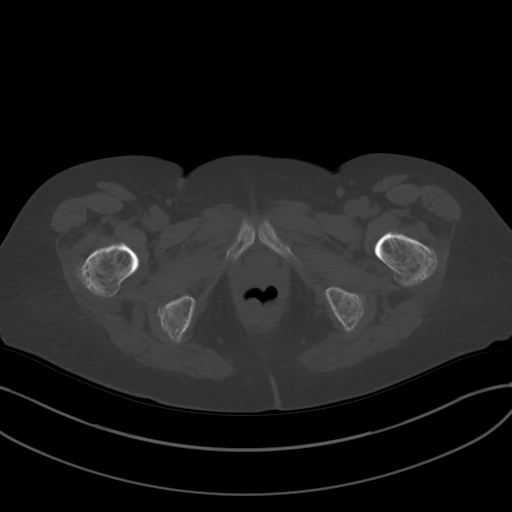
[im 17/85  soft-tissue]
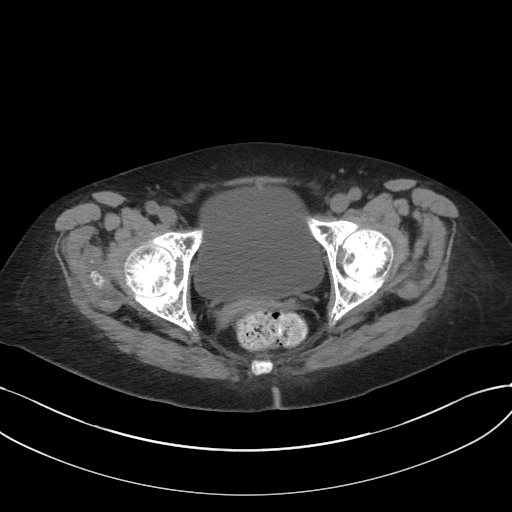
[im 25/85  soft-tissue]
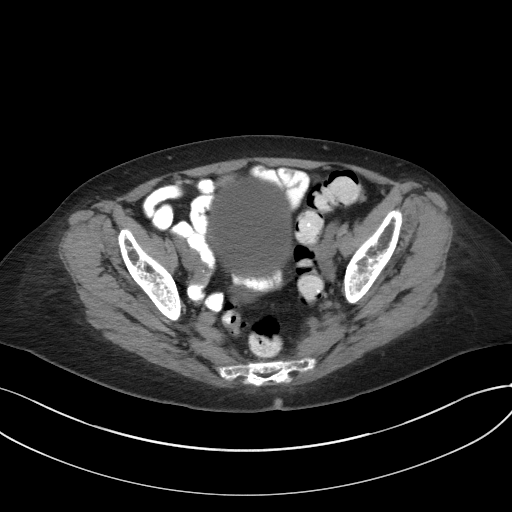
[im 33/85  soft-tissue]
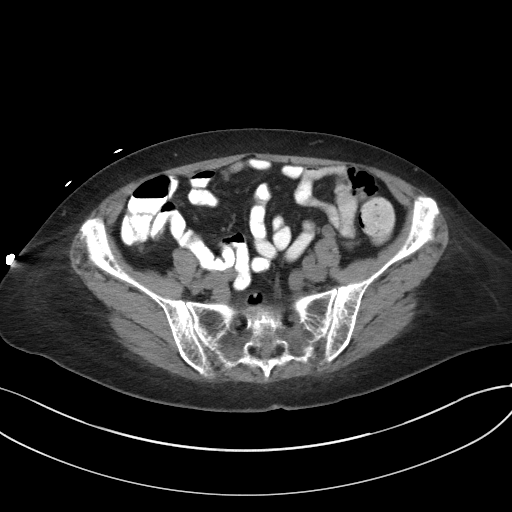
[im 45/85  soft-tissue]
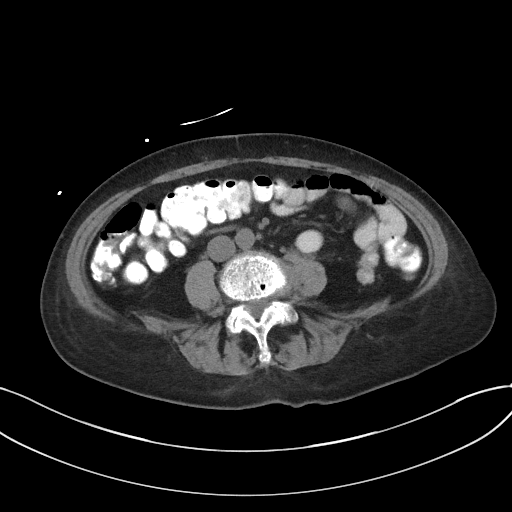
[im 53/85  soft-tissue]
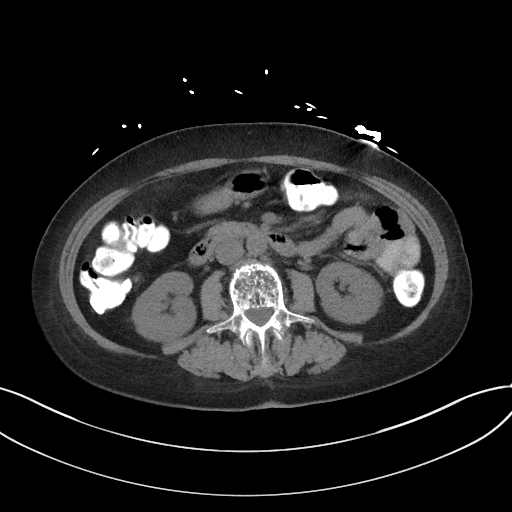
[im 61/85  soft-tissue]
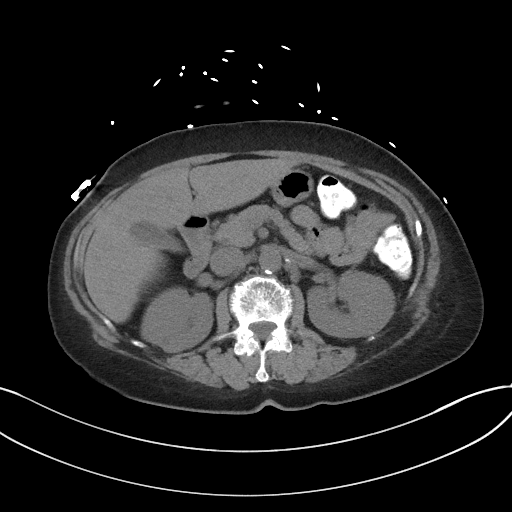
[im 69/85  soft-tissue]
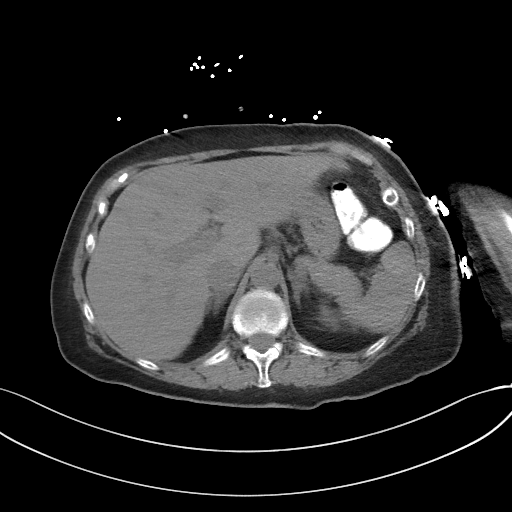
[im 69/85  lung]
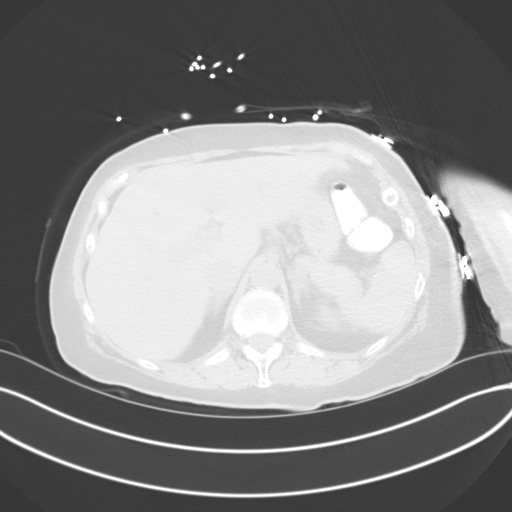
[im 73/85  lung]
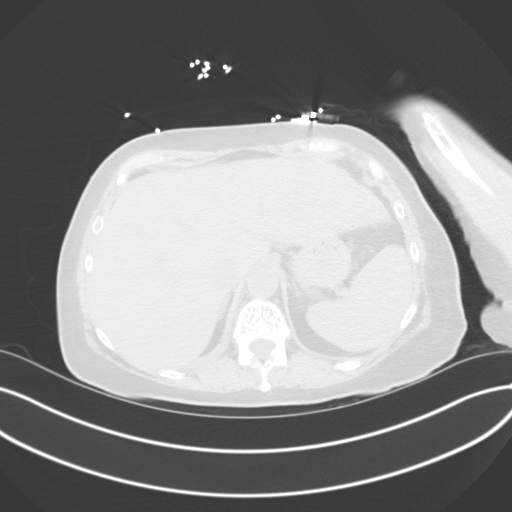
[im 77/85  soft-tissue]
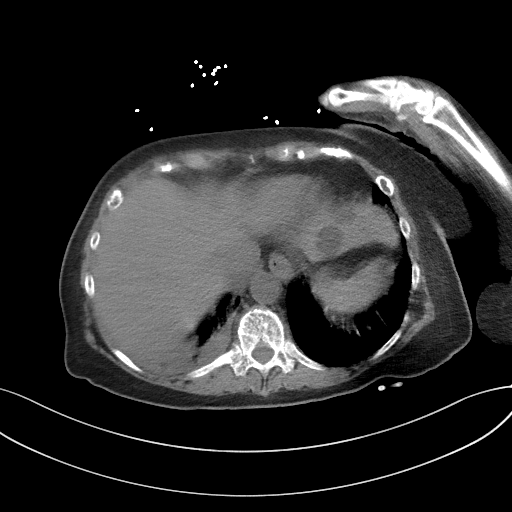
[im 77/85  lung]
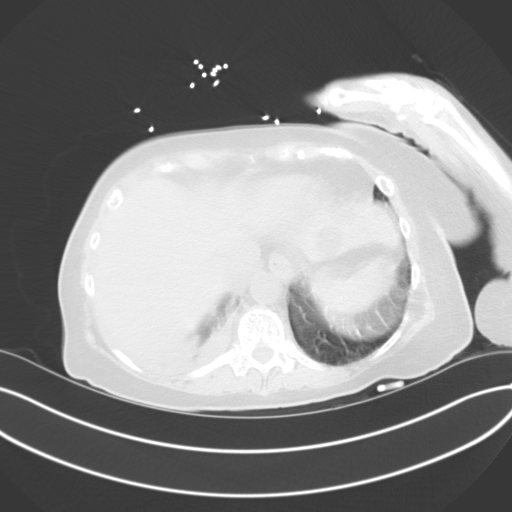
[im 77/85  bone]
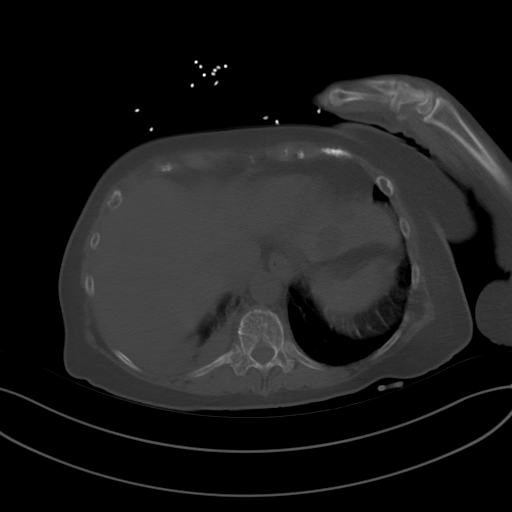
[im 81/85  lung]
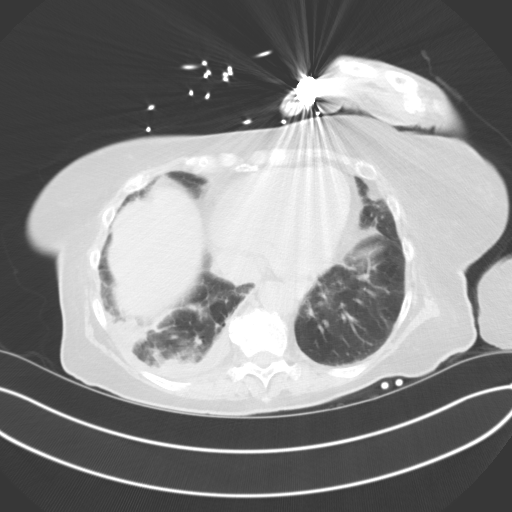

[Series 6: coronal st · coronal · 0.82mm/px · 3 of 66 slices shown]
[im 17/66  soft-tissue]
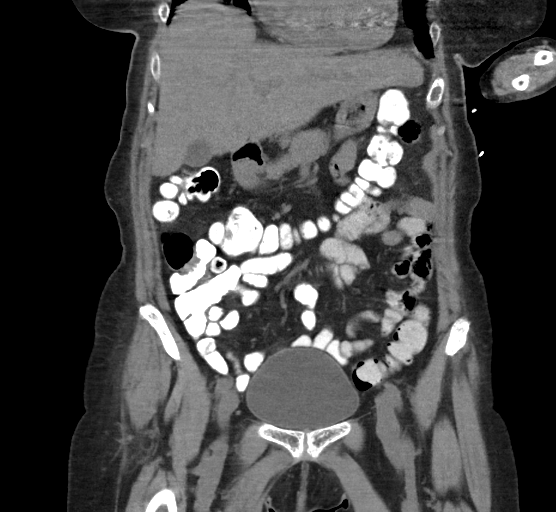
[im 33/66  soft-tissue]
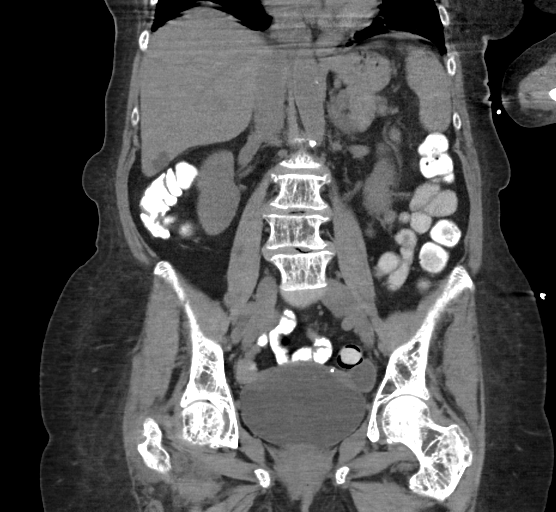
[im 49/66  soft-tissue]
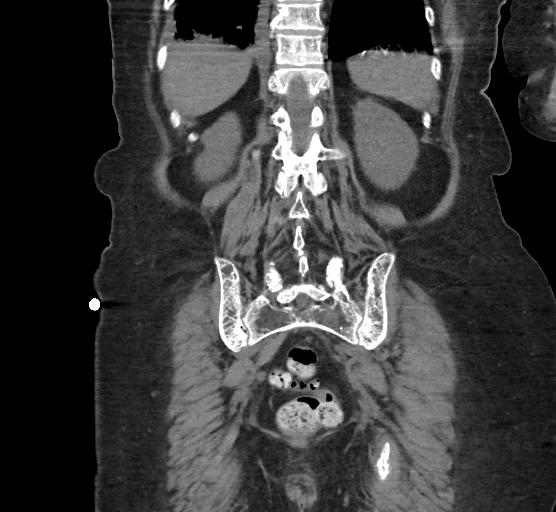

[14 of 46 positions shown; findings below may reference images not displayed]

FINDINGS: Lower chest: Top normal size cardiac chambers. No pericardial
effusion. Small right pleural effusion with adjacent atelectasis
and/or scarring.

Hepatobiliary: Hypodensities in the left hepatic lobe consistent
with hepatic cysts measuring 1.6 and 2.3 cm. A 1.2 cm right hepatic
lobe cyst is also noted. Gallbladder is nondistended. There is no
calculi. No biliary dilatation.

Pancreas: Pancreas demonstrates no ductal dilatation, peripancreatic
inflammation or mass.

Spleen: Normal

Adrenals/Urinary Tract: No adrenal mass. No nephrolithiasis. 2.3 mm
bladder calculus noted on the left.

Stomach/Bowel: Contracted stomach. Normal small bowel rotation
without small bowel dilatation or inflammation. Contrast reaches
large intestine. No annular constricting lesions are noted. There is
no large bowel obstruction or acute inflammation. There is scattered
colonic diverticulosis the sigmoid. No significant perirectal nor
perianal thickening. No inflammation.

Vascular/Lymphatic: Mild aortic atherosclerosis without aneurysm. No
lymphadenopathy.

Reproductive: Uterus and ovaries are visualized. A few coarse
calcifications of doubtful clinical significance noted of the right
ovary. There is a simple appearing cyst of the left ovary measuring
approximately 2 cm. No worrisome features.

Other:  Tiny fat containing umbilical hernia.

Musculoskeletal: The bones are osteopenic in appearance there is
mild dextroconvex curvature of the lumbar spine. Disc space
narrowing L3-4 and L4-5 with associated facet arthropathy. Slight
coarsening and thickening of the bony pelvis and hips may reflect
changes of Paget's disease.
IMPRESSION: No acute bowel inflammation or obstruction. No focal lesions are
identified.

Simple appearing hepatic and left ovarian cysts.

Small right pleural effusion with atelectasis and/or scarring.

Coarsened and thickened appearance of the bony pelvis which may
reflect Paget's disease. Degenerative changes of the lower lumbar
spine.
